# Patient Record
Sex: Male | Born: 1955 | Race: White | Hispanic: No | Marital: Married | Smoking: Never smoker
Health system: Southern US, Community
[De-identification: ages and names within clinical notes are randomized; demographics above are authoritative.]

## PROBLEM LIST (undated history)

## (undated) DIAGNOSIS — M1990 Unspecified osteoarthritis, unspecified site: Secondary | ICD-10-CM

## (undated) DIAGNOSIS — Z9689 Presence of other specified functional implants: Secondary | ICD-10-CM

## (undated) DIAGNOSIS — N200 Calculus of kidney: Secondary | ICD-10-CM

## (undated) DIAGNOSIS — G4730 Sleep apnea, unspecified: Secondary | ICD-10-CM

## (undated) DIAGNOSIS — G5620 Lesion of ulnar nerve, unspecified upper limb: Secondary | ICD-10-CM

## (undated) DIAGNOSIS — G20 Parkinson's disease: Secondary | ICD-10-CM

## (undated) DIAGNOSIS — G20A1 Parkinson's disease without dyskinesia, without mention of fluctuations: Secondary | ICD-10-CM

## (undated) DIAGNOSIS — G479 Sleep disorder, unspecified: Secondary | ICD-10-CM

## (undated) HISTORY — DX: Parkinson's disease: G20

## (undated) HISTORY — DX: Sleep disorder, unspecified: G47.9

## (undated) HISTORY — DX: Presence of other specified functional implants: Z96.89

## (undated) HISTORY — PX: OTHER SURGICAL HISTORY: SHX169

## (undated) HISTORY — DX: Calculus of kidney: N20.0

## (undated) HISTORY — DX: Lesion of ulnar nerve, unspecified upper limb: G56.20

---

## 1989-02-11 HISTORY — PX: OTHER SURGICAL HISTORY: SHX169

## 2002-06-28 ENCOUNTER — Encounter: Payer: Self-pay | Admitting: Emergency Medicine

## 2002-06-28 ENCOUNTER — Emergency Department (HOSPITAL_COMMUNITY): Admission: EM | Admit: 2002-06-28 | Discharge: 2002-06-28 | Payer: Self-pay

## 2011-10-18 ENCOUNTER — Other Ambulatory Visit: Payer: Self-pay | Admitting: Family Medicine

## 2011-10-18 DIAGNOSIS — R109 Unspecified abdominal pain: Secondary | ICD-10-CM

## 2011-10-22 ENCOUNTER — Other Ambulatory Visit: Payer: Self-pay

## 2011-11-19 ENCOUNTER — Other Ambulatory Visit: Payer: Self-pay | Admitting: Gastroenterology

## 2011-11-19 DIAGNOSIS — R109 Unspecified abdominal pain: Secondary | ICD-10-CM

## 2011-11-20 ENCOUNTER — Ambulatory Visit
Admission: RE | Admit: 2011-11-20 | Discharge: 2011-11-20 | Disposition: A | Discharge status: Home / Self Care | Payer: PRIVATE HEALTH INSURANCE | Source: Ambulatory Visit | Attending: Gastroenterology | Admitting: Gastroenterology

## 2011-11-20 DIAGNOSIS — R109 Unspecified abdominal pain: Secondary | ICD-10-CM

## 2011-11-20 MED ORDER — IOHEXOL 300 MG/ML  SOLN
125.0000 mL | Freq: Once | INTRAMUSCULAR | Status: AC | PRN
  Administered 2011-11-20: 125 mL via INTRAVENOUS

## 2012-07-15 ENCOUNTER — Ambulatory Visit (INDEPENDENT_AMBULATORY_CARE_PROVIDER_SITE_OTHER): Payer: PRIVATE HEALTH INSURANCE | Admitting: Neurology

## 2012-07-15 ENCOUNTER — Encounter: Payer: Self-pay | Admitting: Neurology

## 2012-07-15 VITALS — BP 131/71 | HR 71 | Temp 97.3°F | Ht 74.0 in | Wt 240.0 lb

## 2012-07-15 DIAGNOSIS — G20 Parkinson's disease: Secondary | ICD-10-CM

## 2012-07-15 NOTE — Progress Notes (Signed)
Subjective:    Patient ID: [name] is a 57 y.o. male.  HPI  Interim history:   [name] Is a 57 year old right-handed gentleman with a approximately 2 year history of right hand tremor who presents for followup consultation of his Parkinson's disease. He is accompanied by his wife today. This is his first visit with me and he previously followed with Dr. Fayrene Fearing love and was last seen by him on 03/17/2012 at which time Dr. Sandria Manly increase his Requip and continued the patient on the Azilect. The patient has an underlying medical history of hyperlipidemia otherwise. He is currently on Requip long-acting 4 mg daily and rasagiline 1 mg once daily.  I reviewed Dr. Imagene Gurney prior notes and the patient's records and below is a summary of that review:  57 year old right-handed gentleman with an approximately two-year history of right hand tremor with diagnosis of Parkinson's disease. MRI brain without contrast was done in the past. He has been on rasagiline. This improved his tremor. 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 There is no family history of tremor. There is no history of REM behavior disorder. He has had no falls, or hallucinations or involuntary movements otherwise. He exercises not very regularly.  He reports no new problems. His memory is stable. His mood is stable. He has not had any recent falls. His balance is fairly well preserved in his perception. His wife does not have any additional remarks or concerns. He has not been exercising regularly lately however. He has had no problems with lower extremities swelling or compulsive thoughts or ganglion or excessive eating. He tries to stay active and has 2 sons ages 33 as well as a sixth grader. He works full-time as a Scientist, research (physical sciences) and has noticed some daytime tiredness. He takes about his medications in the morning and was asked to try to take the Requip XL at 5 PM started forgetting the  dose because he typically is outside the house at that time so he switched back to morning.   His Past Medical History Is Significant For: Past Medical History  Diagnosis Date  . Calculus of kidney   . Sleep disturbance, unspecified   . Lesion of ulnar nerve   . Paralysis agitans     His Past Surgical History Is Significant For: Past Surgical History  Procedure Laterality Date  . Kidney stones  1991    His Family History Is Significant For: Family History  Problem Relation Age of Onset  . Heart failure Father   . Cancer Father   . Diabetes Father   . Sleep apnea Mother   . Sleep apnea Brother     His Social History Is Significant For: History   Social History  . Marital Status: Married    Spouse Name: N/A    Number of Children: N/A  . Years of Education: N/A   Social History Main Topics  . Smoking status: Never Smoker   . Smokeless tobacco: None  . Alcohol Use: 1.5 oz/week    3 drink(s) per week  . Drug Use: No  . Sexually Active: None   Other Topics Concern  . None   Social History Narrative  . None    His Allergies Are:  No Known Allergies:   His Current Medications Are:  Outpatient Encounter Prescriptions as of 07/15/2012  Medication Sig Dispense Refill  . AZILECT 1 MG TABS       . rOPINIRole (  REQUIP XL) 4 MG 24 hr tablet        No facility-administered encounter medications on file as of 07/15/2012.   Review of Systems  Constitutional: Positive for fatigue.  HENT: Positive for tinnitus.   Respiratory:       Snoring  Neurological: Positive for tremors.  Psychiatric/Behavioral: Positive for sleep disturbance.    Objective:  Neurologic Exam  Physical Exam Physical Examination:   Filed Vitals:   07/15/12 1131  BP: 131/71  Pulse: 71  Temp: 97.3 F (36.3 C)    General Examination: The patient is a very pleasant 57 y.o. male in no acute distress.  HEENT: Normocephalic, atraumatic, pupils are equal, round and reactive to light and  accommodation. Funduscopic exam is normal with sharp disc margins noted. Extraocular tracking shows mild saccadic breakdown without nystagmus noted. There is no limitation to his gaze. There is mild decrease in eye blink rate. Hearing is intact. Face is symmetric with mild facial masking and normal facial sensation. There is no lip, neck or jaw tremor. Neck is moderately rigid with intact passive ROM. There are no carotid bruits on auscultation. Oropharynx exam reveals mild mouth dryness. No significant airway crowding is noted. Mallampati is class II. Tongue protrudes centrally and palate elevates symmetrically.   There is no drooling.   Chest: is clear to auscultation without wheezing, rhonchi or crackles noted.  Heart: sounds are regular and normal without murmurs, rubs or gallops noted.   Abdomen: is soft, non-tender and non-distended with normal bowel sounds appreciated on auscultation.  Extremities: There is no pitting edema in the distal lower extremities bilaterally. There are no varicose veins.  Skin: is warm and dry with no trophic changes noted.  Musculoskeletal: exam reveals no obvious joint deformities, tenderness, joint swelling or erythema.  Neurologically:  Mental status: The patient is awake and alert, paying good  attention. He is able to completely provide the history. His wife provides details. He is oriented to: person, place, time/date, situation, day of week, month of year and year. His memory, attention, language and knowledge are intact. There is no aphasia, agnosia, apraxia or anomia. There is a no bradyphrenia. Speech is mildly hypophonic with no dysarthria noted. Mood is congruent and affect is normal.   Cranial nerves are as described above under HEENT exam. In addition, shoulder shrug is normal with equal shoulder height noted.  Motor exam: Normal bulk, and strength for age is noted. There are no dyskinesias noted.  Tone is mildly rigid with presence of cogwheeling  in the right upper extremity. There is overall mild bradykinesia. There is no drift or rebound.  There is a mild resting tremor in the right upper extremity. The tremor is intermittent.  Romberg is negative.  Reflexes are 1+ in the upper extremities and 1+ in the lower extremities.   Fine motor skills exam: Finger taps are moderately impaired on the right and mildly impaired on the left. Hand movements are mildly impaired on the right and Not impaired on the left. RAP (rapid alternating patting) is mildly impaired on the right and Not impaired on the left. Foot taps are moderately impaired on the right and mildly impaired on the left. Foot agility (in the form of heel stomping) is mildly impaired on the right and Not impaired on the left.    Cerebellar testing shows no dysmetria or intention tremor on finger to nose testing. Heel to shin is unremarkable bilaterally. There is no truncal or gait ataxia.   Sensory exam  is intact to light touch, pinprick, vibration, temperature sense and proprioception in the upper and lower extremities.   Gait, station and balance: He stands up from the seated position with no significant difficulty and does not need to push up with His hands. He needs no assistance. No veering to one side is noted. He is not noted to lean to the side. Posture is mildly stooped for age. Stance is narrow-based. He walks with good stride length and pace and decreased arm swing on the right. He turns in en bloc. Balance is Preserved.   Assessment and Plan:   Assessment and Plan:  In summary, [name] is a very pleasant 57 y.o.-year old male with a history of Parkinson's disease, right-sided predominant. His physical exam is stable and has not progressed in the last 4 months. He is doing fairly well at this time and I reassured the patient in that regard.  I had a long chat with the patient and his wife about His symptoms, my findings and the diagnosis of parkinsonism/Parksinson's  disease, its prognosis and treatment options. We talked about medical treatments and non-pharmacological approaches. We talked about maintaining a healthy lifestyle in general. I encouraged the patient to eat healthy, exercise daily and keep well hydrated, to keep a scheduled bedtime and wake time routine, to not skip any meals and eat healthy snacks in between meals and to have protein with every meal. In particular, I stressed the importance of regular exercise, within of course the patient's own mobility limitations. I asked him to consider taking coenzyme Q 10 at 400 mg 3 times a day as it has shown promising results with respect to perhaps delaying the progression of the disease. I also talked to him about surgical treatment options for Parkinson's disease in particular DBS. This would be something I would promote for him down the Road. As far as further diagnostic testing is concerned, I suggested: No new test today.  As far as medications are concerned, I recommended the following at this time: no change with the exception of trying coenzyme Q 10 and switching his Requip XL to nighttime because he has been noticing daytime somnolence. I answered all their questions today and the patient and his wife were in agreement with the above outlined plan. I would like to see the patient back in 6 months, sooner if the need arises and encouraged them to call with any interim questions, concerns, problems or updates and refill requests.

## 2012-07-15 NOTE — Patient Instructions (Addendum)
I think overall you are doing fairly well and are stable at this point.   I do have some generic suggestions for you today:   Please make sure that you drink plenty of fluids. I would like for you to exercise daily for example in the form of walking 20-30 minutes every day, if you can. Please keep a regular sleep-wake schedule, keep regular meal times, do not skip any meals, eat  healthy snacks in between meals, such as fruit or nuts. Try to eat protein with every meal.   As far as your medications are concerned, I would like to suggest: look into taking Co-enzyme Q 10, 400mg  three times a day. Try to switch the Requip to dinner time or bedtime.     As far as diagnostic testing, I recommend: no new tests.   I do not think we need to make any changes in your medications at this point. I think you're stable enough that I can see you back in 6 months, sooner if we need to. Please call us if you have any interim questions, concerns, or problems or updates to need to discuss.  Brett Canales is my clinical assistant and will answer any of your questions and relay your messages to me and will give you my messages.   Our phone number is [phone]. We also have an after hours call service for urgent matters and there is a physician on-call for urgent questions. For any emergencies you know to call 911 or go to the nearest emergency room.

## 2012-08-02 ENCOUNTER — Other Ambulatory Visit: Payer: Self-pay

## 2012-08-02 MED ORDER — ROPINIROLE HCL ER 4 MG PO TB24: 4.0000 mg | ORAL_TABLET | Freq: Every day | ORAL | Status: DC

## 2012-08-02 MED ORDER — RASAGILINE MESYLATE 1 MG PO TABS: 1.0000 mg | ORAL_TABLET | Freq: Every day | ORAL | Status: DC

## 2012-09-14 ENCOUNTER — Telehealth: Payer: Self-pay | Admitting: Neurology

## 2013-01-06 ENCOUNTER — Other Ambulatory Visit: Payer: Self-pay | Admitting: Neurology

## 2013-01-14 ENCOUNTER — Ambulatory Visit: Payer: PRIVATE HEALTH INSURANCE | Admitting: Neurology

## 2013-02-03 ENCOUNTER — Ambulatory Visit: Payer: PRIVATE HEALTH INSURANCE | Admitting: Neurology

## 2013-02-05 ENCOUNTER — Encounter: Payer: Self-pay | Admitting: Neurology

## 2013-02-05 ENCOUNTER — Ambulatory Visit (INDEPENDENT_AMBULATORY_CARE_PROVIDER_SITE_OTHER): Payer: PRIVATE HEALTH INSURANCE | Admitting: Neurology

## 2013-02-05 ENCOUNTER — Encounter (INDEPENDENT_AMBULATORY_CARE_PROVIDER_SITE_OTHER): Payer: Self-pay

## 2013-02-05 VITALS — BP 114/78 | HR 73 | Temp 97.8°F | Ht 74.0 in | Wt 243.0 lb

## 2013-02-05 DIAGNOSIS — G20 Parkinson's disease: Secondary | ICD-10-CM

## 2013-02-05 DIAGNOSIS — G20A1 Parkinson's disease without dyskinesia, without mention of fluctuations: Secondary | ICD-10-CM

## 2013-02-05 MED ORDER — RASAGILINE MESYLATE 1 MG PO TABS: 1.0000 mg | ORAL_TABLET | Freq: Every day | ORAL | Status: DC

## 2013-02-05 MED ORDER — ROTIGOTINE 4 MG/24HR TD PT24: 1.0000 | MEDICATED_PATCH | Freq: Every day | TRANSDERMAL | Status: DC

## 2013-02-05 NOTE — Patient Instructions (Signed)
I think your Parkinson's disease has become a little worse since you stopped the Requip. As you know, this disease does progress with time. It can affect your balance, your memory, your mood, your bowel and bladder function, your posture, balance and walking. Overall you are doing fairly well but I do want to suggest a few things today:  Remember to drink plenty of fluid, eat healthy meals and do not skip any meals. Try to eat protein with a every meal and eat a healthy snack such as fruit or nuts in between meals. Try to keep a regular sleep-wake schedule and try to exercise daily, particularly in the form of walking, 20-30 minutes a day, if you can.   Taking your medication on schedule is key.   Try to stay active physically and mentally. Engage in social activities in your community and with your family and try to keep up with current events by reading the newspaper or watching the news. Try to do word puzzles and you may like to do word puzzles and brain games on the computer such as on [email or web address]   As far as your medications are concerned, I would like to suggest that you take your current medication with the following additional changes: We will start you on Neupro: 2mg  once daily patch for 7 days, for which I have provided you with samples. Then 30 days of free prescription for 4 mg patch once daily. We can then continue with 4 mg/24h or go up to 6 mg or stay at 4 mg.      As far as diagnostic testing, I will order: no new test today.   I would like to see you back in 6 months, sooner if we need to. Please call us with any interim questions, concerns, problems, updates or refill requests.  Please also call us in about 3 weeks for an update and leave a message for me through one of our triage nurses. They will answer any of your questions and relay your messages to me and also relay most of my messages to you.  Our phone number is [phone]. We also have an after hours call service for  urgent matters and there is a physician on-call for urgent questions, that cannot wait till the next work day. For any emergencies you know to call 911 or go to the nearest emergency room.

## 2013-02-05 NOTE — Progress Notes (Signed)
Subjective:    Patient ID: [name] is a 57 y.o. male.  HPI    Interim history:   [name] is a 57 year old right-handed gentleman with an approximately 2 1/2 year history of right hand tremor, who presents for followup consultation of [name] Parkinson's disease. [name] is accompanied by [name] wife again today. I first met [name] on 07/15/2012, at which time I suggested [name] start taking coenzyme Q 10. I also suggested that [name] switch [name] Requip XL 4 mg to nighttime because of report of daytime somnolence. I did not increase make any other changes to [name] medications and felt that [name] was overall stable.  Today, [name] reports that [name] stopped taking the Requip about 8 weeks ago, stating that it was "ruining my life". [name] started having severe nausea and continued to have the sleepiness. [name] felt better initially after coming off of Requip and [name] wife noted, that [name] was always in a "daze", which has improved. [name] is noted to snore by [name] wife but she has not noticed any apneas while [name] is asleep. [name] denies choking sensations or gasping feeling while asleep. In the past few weeks [name] has noted a flareup in [name] tremors and [name] balance is a little worse. [name] feels worse in [name] motor symptoms and better with regards to the sleepiness. [name] mood is stable and [name] memory is good.   [name] previously followed with Dr. Fayrene Fearing love and was last seen by [name] on 03/17/2012 at which time Dr. Sandria Manly increase [name] Requip XL and continued [name] on Azilect.  [name] has an underlying medical history of hyperlipidemia. [name] is currently only on rasagiline 1 mg once daily.  MRI brain without contrast was done in the past. [name] has been on rasagiline, which  improved [name] tremor. 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 There is no family history of tremor. There is no history of REM behavior disorder. [name] has had no falls, or hallucinations or involuntary movements otherwise. [name] exercises not very regularly.  [name] memory and mood have been stable. [name] has no problems with lower extremities swelling or compulsive thoughts or gambling. 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   [name] Past Medical History Is Significant For: Past Medical History  Diagnosis Date  . Calculus of kidney   . Sleep disturbance, unspecified   . Lesion of ulnar nerve   . Paralysis agitans     [name] Past Surgical History Is Significant For: Past Surgical History  Procedure Laterality Date  . Kidney stones  1991    [name] Family History Is Significant For: Family History  Problem Relation Age of Onset  . Heart failure Father   . Cancer Father   . Diabetes Father   . Sleep apnea Mother   . Sleep apnea Brother     [name] Social History Is Significant For: History   Social History  . Marital Status: Married    Spouse Name: N/A    Number of Children: N/A  . Years of Education: N/A   Social History Main Topics  . Smoking status: Never Smoker   . Smokeless tobacco: None  . Alcohol Use: 1.5 oz/week    3 drink(s) per week  . Drug Use: No  . Sexual Activity: None   Other Topics Concern  . None   Social History Narrative  . None    [name] Allergies Are:  No Known Allergies:   [name] Current Medications  Are:  Outpatient Encounter Prescriptions as of 02/05/2013  Medication Sig  . AZILECT 1 MG TABS tablet TAKE 1 TABLET (1 MG TOTAL) BY MOUTH DAILY.  Marland Kitchen rOPINIRole (REQUIP XL) 4 MG 24 hr tablet Take 1 tablet (4 mg total) by mouth daily.  :  Review of Systems:  Out of a complete 14 point review of systems, all are reviewed and negative with the exception of these symptoms as listed below:   Review of Systems  Constitutional: Negative.   HENT: Positive for tinnitus.   Eyes: Negative.   Respiratory: Negative.   Cardiovascular: Negative.   Gastrointestinal: Negative.   Endocrine: Negative.   Genitourinary: Positive for frequency.  Musculoskeletal: Positive for neck stiffness.  Skin: Negative.    Allergic/Immunologic: Negative.   Neurological: Positive for tremors and headaches.  Hematological: Negative.   Psychiatric/Behavioral: Positive for sleep disturbance (daytime sleepiness).    Objective:  Neurologic Exam  Physical Exam  Assessment:   Physical Examination:   Filed Vitals:   02/05/13 1127  BP: 114/78  Pulse: 73  Temp: 97.8 F (36.6 C)    General Examination: The patient is a very pleasant 57 y.o. male in no acute distress.  HEENT: Normocephalic, atraumatic, pupils are equal, round and reactive to light and accommodation. Funduscopic exam is normal with sharp disc margins noted. Extraocular tracking shows mild saccadic breakdown without nystagmus noted. There is no limitation to [name] gaze. There is mild decrease in eye blink rate. Hearing is intact. Face is symmetric with mild facial masking and normal facial sensation. There is no lip, neck or jaw tremor. Neck is moderately rigid with intact passive ROM. There are no carotid bruits on auscultation. Oropharynx exam reveals mild mouth dryness. [name] has a mildly crowded airway in that [name] has a floppy appearing soft palate and elongated uvula. [name] is resistant to the tongue blade so it is hard for me to see [name] tonsils. Mallampati is class II. Tongue protrudes centrally and palate elevates symmetrically. There is no drooling.   Chest: is clear to auscultation without wheezing, rhonchi or crackles noted.  Heart: sounds are regular and normal without murmurs, rubs or gallops noted.   Abdomen: is soft, non-tender and non-distended with normal bowel sounds appreciated on auscultation.  Extremities: There is no pitting edema in the distal lower extremities bilaterally. There are no varicose veins.  Skin: is warm and dry with no trophic changes noted.  Musculoskeletal: exam reveals no obvious joint deformities, tenderness, joint swelling or erythema.  Neurologically:  Mental status: The patient is awake and alert, paying  good  attention. [name] is able to completely provide the history. [name] wife provides details. [name] is oriented to: person, place, time/date, situation, day of week, month of year and year. [name] memory, attention, language and knowledge are intact. There is no aphasia, agnosia, apraxia or anomia. There is a no bradyphrenia. Speech is mildly hypophonic with no dysarthria noted. Mood is congruent and affect is normal.   Cranial nerves are as described above under HEENT exam. In addition, shoulder shrug is normal with equal shoulder height noted.  Motor exam: Normal bulk, and strength for age is noted. There are no dyskinesias noted.  Tone is mildly rigid with presence of cogwheeling in the right upper extremity. There is overall mild bradykinesia. There is no drift or rebound.  There is a mild to moderate resting tremor in the right upper extremity. The tremor is fairly constant. This has worsened from last time. Romberg is negative.  Reflexes  are 1+ in the upper extremities and 1+ in the lower extremities.   Fine motor skills exam: Finger taps are moderately to severely impaired on the right and mildly impaired on the left. Hand movements are mildly impaired on the right and very mildly impaired on the left. RAP (rapid alternating patting) is mildly impaired on the right and minimally impaired on the left. Foot taps are moderately impaired on the right and mildly impaired on the left. Foot agility (in the form of heel stomping) is mildly impaired on the right and minimally impaired on the left.    Cerebellar testing shows no dysmetria or intention tremor on finger to nose testing. Heel to shin is unremarkable bilaterally. There is no truncal or gait ataxia.   Sensory exam is intact to light touch, pinprick, vibration, temperature sense in the upper and lower extremities.   Gait, station and balance: [name] stands up from the seated position with no significant difficulty and does not need to push up with [name]  hands. [name] needs no assistance. No veering to one side is noted. [name] is not noted to lean to the side. Posture is mildly stooped for age. Stance is narrow-based. [name] walks with slightly reduced stride length for it [name] height and mildly reduced pace and decreased arm swing on the right. [name] turns in en bloc. Balance is Preserved.   Assessment and Plan:  In summary, ANTAEUS KAREL is a very pleasant 57 year old male with a history of Parkinson's disease, right-sided predominant. [name] physical exam is a little worse today since [name] has stopped [name] Requip XL. [name] was not able to tolerate it. While [name] does not endorse any witnessed apneas, given [name] complaint of excessive daytime somnolence and [name] somewhat tighter looking airway as well as [name] history of snoring I would like to at some point do a sleep study. However, at this juncture, the sleepiness may very well be medication induced, as the sleepiness improved off of Requip. [name] tremor certainly flared up and [name] also noticed a subjective worsening in [name] balance. [name] would like to hold off on a sleep study at this juncture but would be willing to consider it in the future if the need arises. I've encouraged [name] wife to watch for apneas. She currently has not noticed any witnessed apneas and [name] denies any choking sensations or gasping while asleep. [name] has always had some trouble maintaining sleep. I had a long chat again with the patient and [name] wife today. I would like for [name] to continue the Azilect 1 mg at the current dose. I do think that we are to initiate an additional medication at this time. I would like to be able to spare levodopa as long as we can and to that end I would like for [name] to try rotigotine patch. I provided [name] with a one-week sample of the 2 mg strength. I also provided [name] with a 30 day free trial prescription for the 4 mg patch. After about 3 weeks [name] is advised to call for an update. At that time we can continue with 4 mg, increased to 6 mg  patches or reduce to 2 mg if the need arises. I talked to them about potential side effects including headaches, nausea, sleepiness. I encouraged the patient to eat healthy, exercise daily and keep well hydrated, to keep a scheduled bedtime and wake time routine, to not skip any meals and eat healthy snacks in between meals and to have protein with  every meal. In particular, I stressed the importance of regular exercise, within of course the patient's own mobility limitations. I answered all their questions today and the patient and [name] wife were in agreement with the above outlined plan. I would like to see the patient back in 6 months, sooner if the need arises and encouraged them to call with any interim questions, concerns, problems or updates and refill requests.

## 2013-03-11 ENCOUNTER — Other Ambulatory Visit: Payer: Self-pay | Admitting: Neurology

## 2013-03-11 NOTE — Telephone Encounter (Signed)
I tried to call the patient to see how he was doing on this dose, got no answer.  No VM.

## 2013-04-14 ENCOUNTER — Other Ambulatory Visit: Payer: Self-pay | Admitting: Neurology

## 2013-04-15 ENCOUNTER — Telehealth: Payer: Self-pay | Admitting: Neurology

## 2013-04-15 NOTE — Telephone Encounter (Signed)
Pt called regarding medication patch that Dr. Frances FurbishAthar prescribed pt. Pt states the first  6 weeks was fine  Now irritation and leaving marks on skin for [redacted] weeks along with itching. He thinks patch is working but is getting concerned about the irritation of the skin. Please call pt concerning this matter

## 2013-04-15 NOTE — Telephone Encounter (Signed)
Patient is calling concerned about the skin irration that he is having from the patch that he is using. Patient states that the patch seems to be working, but has some concerns about the skin irration. Please advise.

## 2013-04-15 NOTE — Telephone Encounter (Signed)
Unfortunately, this seems to be a more lasting skin reaction than just a transient irritation. Please have patient stop neupro patch.

## 2013-04-16 NOTE — Telephone Encounter (Signed)
Called patient to inform him per Dr. Frances FurbishAthar to stop using the neupro patches. Patient stated that he needed something else to used. Please advise

## 2013-04-16 NOTE — Telephone Encounter (Signed)
[facility] Please return call to patient.

## 2013-04-16 NOTE — Telephone Encounter (Signed)
I would recommend a new medication right away to make sure his reaction to the patch is gone. Perhaps we can wait until next week and see if his rash and pruritus are gone. Please ask him to call us for an update mid or end of next week and we will consider another medication at that time.

## 2013-04-16 NOTE — Telephone Encounter (Signed)
Correction: I would NOT recommend another med right away after stopping Neupro. See below for rest of my message.

## 2013-04-16 NOTE — Telephone Encounter (Signed)
Pt called back.  He asked what medication should he been on since Dr. Frances FurbishAthar advised him to stop using them.  Please call to advise.  Thank you

## 2013-04-19 NOTE — Telephone Encounter (Signed)
Called patient and left message informing him that Dr. Frances FurbishAthar would like for him to wait until the skin irration was completely cleared up before trying a new medication. I advised the patient that if he has any other problems, questions or concern to call back.

## 2013-04-22 MED ORDER — ROTIGOTINE 4 MG/24HR TD PT24: 1.0000 | MEDICATED_PATCH | Freq: Every day | TRANSDERMAL | Status: DC

## 2013-04-22 NOTE — Telephone Encounter (Signed)
We can renew prescription for neupro 4 milligrams strength, but please advise patient that the only reason we discontinued it was the rash. There is a good chance he may have another issue with skin rash with restarting it. As long as he wants to try it again understanding this, I am fine with it. It did not sound like the rash was severe. A local skin reaction is always possible and if tolerable does not have to result in discontinuation of medication as long as it does not cause blisters or itching or hives.

## 2013-04-22 NOTE — Telephone Encounter (Signed)
Pt called back states the rash is gone and he needs to get a prescription for NEUPRO 4 MG/24HR called into his pharmacy. Please call pt back once this has been called in.

## 2013-04-22 NOTE — Telephone Encounter (Signed)
Patient called operator stating rash has cleared and he would like to get a refill on Neupro.  By viewing previous messages, it seems as though Neupro was d/c due to skin irritation, and we may consider changing to a new drug once rash cleared.  I tried to call the patient back, got no answer.  Is there a new medication you would like to prescribe?  Please advise.  Thank you.

## 2013-04-22 NOTE — Telephone Encounter (Signed)
Rx has been sent.  I called the patient to advise.  Got no answer.  Left message relaying info noted by Dr Frances FurbishAthar.  Asked him to call us back if there were any questions or adverse side effects.

## 2013-05-13 NOTE — Telephone Encounter (Signed)
Closing encounter

## 2013-06-15 ENCOUNTER — Other Ambulatory Visit: Payer: Self-pay | Admitting: Neurology

## 2013-07-28 ENCOUNTER — Ambulatory Visit (INDEPENDENT_AMBULATORY_CARE_PROVIDER_SITE_OTHER): Payer: PRIVATE HEALTH INSURANCE | Admitting: Neurology

## 2013-07-28 ENCOUNTER — Encounter: Payer: Self-pay | Admitting: Neurology

## 2013-07-28 VITALS — BP 122/86 | HR 60 | Temp 97.1°F | Ht 74.0 in | Wt 250.0 lb

## 2013-07-28 DIAGNOSIS — R0609 Other forms of dyspnea: Secondary | ICD-10-CM

## 2013-07-28 DIAGNOSIS — R0683 Snoring: Secondary | ICD-10-CM

## 2013-07-28 DIAGNOSIS — R0989 Other specified symptoms and signs involving the circulatory and respiratory systems: Secondary | ICD-10-CM

## 2013-07-28 DIAGNOSIS — E663 Overweight: Secondary | ICD-10-CM

## 2013-07-28 DIAGNOSIS — R400 Somnolence: Secondary | ICD-10-CM

## 2013-07-28 DIAGNOSIS — G20 Parkinson's disease: Secondary | ICD-10-CM

## 2013-07-28 DIAGNOSIS — G4710 Hypersomnia, unspecified: Secondary | ICD-10-CM

## 2013-07-28 MED ORDER — ROTIGOTINE 6 MG/24HR TD PT24: 1.0000 | MEDICATED_PATCH | Freq: Every day | TRANSDERMAL | Status: DC

## 2013-07-28 MED ORDER — RASAGILINE MESYLATE 1 MG PO TABS: 1.0000 mg | ORAL_TABLET | Freq: Every day | ORAL | Status: DC

## 2013-07-28 NOTE — Patient Instructions (Addendum)
I think your Parkinson's disease has remained fairly stable, which is reassuring. Nevertheless, as you know, this disease does progress with time. It can affect your balance, your memory, your mood, your bowel and bladder function, your posture, balance and walking. Overall you are doing fairly well but I do want to suggest a few things today:  Remember to drink plenty of fluid, eat healthy meals and do not skip any meals. Try to eat protein with a every meal and eat a healthy snack such as fruit or nuts in between meals. Try to keep a regular sleep-wake schedule and try to exercise daily, particularly in the form of walking, 20-30 minutes a day, if you can.   Taking your medication on schedule is key.   Try to stay active physically and mentally. Engage in social activities in your community and with your family and try to keep up with current events by reading the newspaper or watching the news. Try to do word puzzles and you may like to do word puzzles and brain games on the computer such as on [email or web address].   As far as your medications are concerned, I would like to suggest that you take your current medication with the following additional changes: increase the Neupro to 6 mg daily. Use the co-pay card.     As far as diagnostic testing, I will order: no new test.   I would like to see you back in 4 months, sooner if we need to. Please call us with any interim questions, concerns, problems, updates or refill requests.

## 2013-07-28 NOTE — Progress Notes (Signed)
Subjective:    Patient ID: [name] is a 58 y.o. male.  HPI   Interim history:   Mr. [name] is a 58 year old right-handed gentleman with an underlying history of hyperlipidemia, who presents for followup consultation of his right-sided predominant Parkinson's disease. He is accompanied by his wife again today. I last saw him on 02/05/2013, at which time I felt his physical exam was a little worse since stopping Requip XL. He had stopped this because of daytime somnolence and severe nausea. I suggested we start him on Neupro patch. I provided him with samples. We talked about doing a sleep study down the [place].   Today, he reports having noted some worsening of his tremor and his gait is slower. He has been able to tolerate neupro patch 4 mg strength. He has had some skin irritation, most likely from the adhesive. He does not exercise regularly but is quite active and he continues to work full time. His memory is stable. His mood is stable. He denies any impulse control disorder. He does have mild daytime sleepiness but not nearly as severe as when he was on Requip XL.   I first met him on 07/15/2012, at which time I suggested he start taking coenzyme Q 10. I also suggested that he switch his Requip XL 4 mg to nighttime because of report of daytime somnolence. I did not increase make any other changes to his medications and felt that he was overall stable.  He previously followed with Dr. Jeneen Rinks love and was last seen by him on 03/17/2012 at which time Dr. Erling Cruz increase his Requip XL and continued him on Azilect.  MRI brain without contrast was done in the past. He has been on rasagiline, which improved his tremor. 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 There is no family history of tremor. There is no history of REM behavior disorder. He has had no falls, or hallucinations or involuntary movements otherwise. He exercises not very regularly. His  memory and mood have been stable. He has no problems with lower extremities swelling or compulsive thoughts or gambling. He works full-time as a Engineer, site.   His Past Medical History Is Significant For: Past Medical History  Diagnosis Date  . Calculus of kidney   . Sleep disturbance, unspecified   . Lesion of ulnar nerve   . Paralysis agitans     His Past Surgical History Is Significant For: Past Surgical History  Procedure Laterality Date  . Kidney stones  1991    His Family History Is Significant For: Family History  Problem Relation Age of Onset  . Heart failure Father   . Cancer Father   . Diabetes Father   . Sleep apnea Mother   . Sleep apnea Brother     His Social History Is Significant For: History   Social History  . Marital Status: Married    Spouse Name: N/A    Number of Children: N/A  . Years of Education: N/A   Social History Main Topics  . Smoking status: Never Smoker   . Smokeless tobacco: None  . Alcohol Use: 1.5 oz/week    3 drink(s) per week  . Drug Use: No  . Sexual Activity: None   Other Topics Concern  . None   Social History Narrative  . None    His Allergies Are:  No Known Allergies:   His Current Medications Are:  Outpatient Encounter Prescriptions as  of 07/28/2013  Medication Sig  . rasagiline (AZILECT) 1 MG TABS tablet Take 1 tablet (1 mg total) by mouth daily.  . rotigotine (NEUPRO) 4 MG/24HR Place 1 patch onto the skin daily.  . [DISCONTINUED] AZILECT 1 MG TABS tablet TAKE 1 TABLET (1 MG TOTAL) BY MOUTH DAILY.  :  Review of Systems:  Out of a complete 14 point review of systems, all are reviewed and negative with the exception of these symptoms as listed below: Review of Systems  Constitutional: Negative.   HENT: Negative.   Eyes: Negative.   Respiratory: Negative.   Cardiovascular: Negative.   Gastrointestinal: Negative.   Endocrine: Negative.   Genitourinary: Positive for frequency.  Musculoskeletal:  Negative.   Skin: Negative.   Allergic/Immunologic: Negative.   Neurological: Negative.   Hematological: Negative.   Psychiatric/Behavioral: Negative.     Objective:  Neurologic Exam  Physical Exam Physical Examination:   Filed Vitals:   07/28/13 1218  BP: 122/86  Pulse: 60  Temp: 97.1 F (36.2 C)    General Examination: The patient is a very pleasant 58 y.o. male in no acute distress. He is in good spirits today.   HEENT: Normocephalic, atraumatic, pupils are equal, round and reactive to light and accommodation. Funduscopic exam is normal with sharp disc margins noted. Extraocular tracking shows mild saccadic breakdown without nystagmus noted. There is no limitation to his gaze. There is mild decrease in eye blink rate. Hearing is intact. Face is symmetric with mild facial masking and normal facial sensation. There is no lip, neck or jaw tremor. Neck is moderately rigid with intact passive ROM. There are no carotid bruits on auscultation. Oropharynx exam reveals mild mouth dryness. He has a mild to moderately crowded airway in that he has a floppy appearing soft palate and elongated uvula. Mallampati is class II. Tongue protrudes centrally and palate elevates symmetrically. There is no drooling.   Chest: is clear to auscultation without wheezing, rhonchi or crackles noted.  Heart: sounds are regular and normal without murmurs, rubs or gallops noted.   Abdomen: is soft, non-tender and non-distended with normal bowel sounds appreciated on auscultation.  Extremities: There is no pitting edema in the distal lower extremities bilaterally. There are no varicose veins.  Skin: is warm and dry with no trophic changes noted.  Musculoskeletal: exam reveals no obvious joint deformities, tenderness, joint swelling or erythema.  Neurologically:  Mental status: The patient is awake and alert, paying good  attention. He is able to completely provide the history. His wife provides details. He  is oriented to: person, place, time/date, situation, day of week, month of year and year. His memory, attention, language and knowledge are intact. There is no aphasia, agnosia, apraxia or anomia. There is a no bradyphrenia. Speech is mildly hypophonic with no dysarthria noted. Mood is congruent and affect is normal.   Cranial nerves are as described above under HEENT exam. In addition, shoulder shrug is normal with equal shoulder height noted.  Motor exam: Normal bulk, and strength for age is noted. There are no dyskinesias noted.  Tone is mildly rigid with presence of cogwheeling in the right upper extremity. There is overall mild bradykinesia. There is no drift or rebound.  There is a mild to moderate resting tremor in the right upper extremity. The tremor is fairly constant. There is a slight intermittent LUE and LLE resting tremor.   Romberg is negative.   Reflexes are 1+ in the upper extremities and 1+ in the lower extremities.  Fine motor skills exam: Finger taps are moderately to severely impaired on the right and mildly impaired on the left. Hand movements are mildly impaired on the right and very mildly impaired on the left. RAP (rapid alternating patting) is mildly impaired on the right and minimally impaired on the left. Foot taps are moderately impaired on the right and mildly impaired on the left. Foot agility (in the form of heel stomping) is mildly impaired on the right and minimally impaired on the left.    Cerebellar testing shows no dysmetria or intention tremor on finger to nose testing. Heel to shin is unremarkable bilaterally. There is no truncal or gait ataxia.   Sensory exam is intact to light touch.   Gait, station and balance: He stands up from the seated position with no significant difficulty and does not need to push up with His hands. He needs no assistance. No veering to one side is noted. He is noted to lean slightly to the R side. Posture is mildly stooped for age.  Stance is narrow-based. He walks with slightly reduced stride length for it his height and mildly reduced pace and decreased arm swing on the right. He turns in en bloc. Balance is Preserved.   Assessment and Plan:   In summary, TREYSHAUN KEATTS is a very pleasant 58 year old male with a history of Parkinson's disease, right-sided predominant. His physical exam is slightly worse today, but his nonmotor symptoms are improved or stable. He has been able to tolerate rotigotine patch 4 mg strength and continues to be on Azilect 1 mg once daily. For his daytime sleepiness I have again encouraged him to consider a sleep study. He again would like to hold off on this. While he does not have a telltale history of obstructive sleep apnea his complaint of excessive daytime somnolence and his somewhat tighter looking airway as well as his history of snoring  and his overweight state to put him in a higher risk category. I again had a long chat with the patient and his wife today. I would like for him to continue the Azilect 1 mg at the current dose. I do think that we should increase the rotigotine patch to 6 mg daily. I talked to them about potential side effects including headaches, nausea, sleepiness. I encouraged the patient to eat healthy, exercise daily and keep well hydrated, to keep a scheduled bedtime and wake time routine, to not skip any meals and eat healthy snacks in between meals and to have protein with every meal. In particular, I stressed the importance of regular exercise, within of course the patient's own mobility limitations. I answered all their questions today and the patient and his wife were in agreement with the above outlined plan. I would like to see the patient back in 4 months, sooner if the need arises and encouraged them to call with any interim questions, concerns, problems or updates and refill requests.

## 2013-08-09 ENCOUNTER — Ambulatory Visit: Payer: PRIVATE HEALTH INSURANCE | Admitting: Neurology

## 2013-09-30 ENCOUNTER — Other Ambulatory Visit: Payer: Self-pay | Admitting: Neurology

## 2013-12-03 ENCOUNTER — Encounter: Payer: Self-pay | Admitting: Neurology

## 2013-12-03 ENCOUNTER — Ambulatory Visit (INDEPENDENT_AMBULATORY_CARE_PROVIDER_SITE_OTHER): Payer: PRIVATE HEALTH INSURANCE | Admitting: Neurology

## 2013-12-03 VITALS — BP 107/76 | HR 73 | Temp 98.0°F | Ht 74.0 in | Wt 244.0 lb

## 2013-12-03 DIAGNOSIS — G20 Parkinson's disease: Secondary | ICD-10-CM

## 2013-12-03 MED ORDER — RASAGILINE MESYLATE 1 MG PO TABS: ORAL_TABLET | ORAL | Status: DC

## 2013-12-03 MED ORDER — ROTIGOTINE 6 MG/24HR TD PT24: 1.0000 | MEDICATED_PATCH | Freq: Every day | TRANSDERMAL | Status: DC

## 2013-12-03 NOTE — Progress Notes (Signed)
Subjective:    Patient ID: [name] is a 58 y.o. male.  HPI    Interim history:   [name] is a 58 year old right-handed gentleman with an underlying history of hyperlipidemia and kidney stones, who presents for followup consultation of his right-sided predominant Parkinson's disease. He is unaccompanied today. I last saw him on 07/28/2013, at which time he reported some worsening of his tremor and his gait. He was able to tolerate neupro patch 4 mg strength. He has had some skin irritation, most likely from the adhesive. He was not exercising regularly but endorsed being active and working full-time. His memory was stable. He denies any impulse control disorder but had mild daytime somnolence which was not as severe as when he was on Requip long-acting.  Today, he reports doing well overall. He is better with his exercise. He is trying to 8 injuring the right. He was playing baseball with his 18 year old son and got hit by the ball in his right shin area. He has had a swelling there but no tenderness and no actual bruising. He has his yearly checkup with his primary care physician in February. He's feeling well. He thinks the Neupro at 6 mg has helped. In fact he was away for a long weekend and forgot to take his patches with him noticed a real difference. However when he restarted the patch he noticed significant nausea in the first few days. I saw him on 02/05/2013, at which time I felt his physical exam was a little worse since stopping Requip XL. He had stopped this because of daytime somnolence and severe nausea. I suggested we start him on Neupro patch. I provided him with samples. We talked about doing a sleep study down the [place].  I first met him on 07/15/2012, at which time I suggested he start taking coenzyme Q 10. I also suggested that he switch his Requip XL 4 mg to nighttime because of report of daytime somnolence. I did not increase make any other changes to his medications and  felt that he was overall stable.  He previously followed with Dr. Jeneen Rinks love and was last seen by him on 03/17/2012 at which time Dr. Erling Cruz increase his Requip XL and continued him on Azilect. He was diagnosed in 12/2008, and Sx go back to a year prior to that.  MRI brain without contrast was done in the past. He has been on rasagiline, which improved his tremor. 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 There is no family history of tremor. There is no history of REM behavior disorder. He has had no falls, or hallucinations or involuntary movements otherwise. He exercises not very regularly. His memory and mood have been stable. He has no problems with lower extremities swelling or compulsive thoughts or gambling. He works full-time as a Engineer, site.   His Past Medical History Is Significant For: Past Medical History  Diagnosis Date  . Calculus of kidney   . Sleep disturbance, unspecified   . Lesion of ulnar nerve   . Paralysis agitans     His Past Surgical History Is Significant For: Past Surgical History  Procedure Laterality Date  . Kidney stones  1991    His Family History Is Significant For: Family History  Problem Relation Age of Onset  . Heart failure Father   . Cancer Father   . Diabetes Father   . Sleep apnea Mother   . Sleep apnea  Brother     His Social History Is Significant For: History   Social History  . Marital Status: Married    Spouse Name: N/A    Number of Children: N/A  . Years of Education: N/A   Social History Main Topics  . Smoking status: Never Smoker   . Smokeless tobacco: None  . Alcohol Use: 1.5 oz/week    3 drink(s) per week  . Drug Use: No  . Sexual Activity: None   Other Topics Concern  . None   Social History Narrative  . None    His Allergies Are:  No Known Allergies:   His Current Medications Are:  Outpatient Encounter Prescriptions as of 12/03/2013  Medication Sig  .  rasagiline (AZILECT) 1 MG TABS tablet TAKE 1 TABLET (1 MG TOTAL) BY MOUTH DAILY.  . rotigotine (NEUPRO) 6 MG/24HR Place 1 patch onto the skin daily.  . [DISCONTINUED] AZILECT 1 MG TABS tablet TAKE 1 TABLET (1 MG TOTAL) BY MOUTH DAILY.  . [DISCONTINUED] rasagiline (AZILECT) 1 MG TABS tablet Take 1 tablet (1 mg total) by mouth daily.  . [DISCONTINUED] rotigotine (NEUPRO) 4 MG/24HR Place 1 patch onto the skin daily.  . [DISCONTINUED] rotigotine (NEUPRO) 6 MG/24HR Place 1 patch onto the skin daily.  :  Review of Systems:  Out of a complete 14 point review of systems, all are reviewed and negative with the exception of these symptoms as listed below:  Review of Systems Neg.  Objective:  Neurologic Exam  Physical Exam Physical Examination:   Filed Vitals:   12/03/13 1136  BP: 107/76  Pulse: 73  Temp: 98 F (36.7 C)   General Examination: The patient is a very pleasant 58 y.o. male in no acute distress. He is in good spirits today.   HEENT: Normocephalic, atraumatic, pupils are equal, round and reactive to light and accommodation. Funduscopic exam is normal with sharp disc margins noted. Extraocular tracking shows mild saccadic breakdown without nystagmus noted. There is no limitation to his gaze. There is mild decrease in eye blink rate. Hearing is intact. Face is symmetric with mild facial masking and normal facial sensation. There is no lip, neck or jaw tremor. Neck is moderately rigid with intact passive ROM. There are no carotid bruits on auscultation. Oropharynx exam reveals mild mouth dryness. He has a mild to moderately crowded airway in that he has a floppy appearing soft palate and elongated uvula. Mallampati is class II. Tongue protrudes centrally and palate elevates symmetrically. There is no drooling.   Chest: is clear to auscultation without wheezing, rhonchi or crackles noted.  Heart: sounds are regular and normal without murmurs, rubs or gallops noted.   Abdomen: is soft,  non-tender and non-distended with normal bowel sounds appreciated on auscultation.  Extremities: There is no pitting edema in the distal lower extremities bilaterally. There are no varicose veins. He has a mild relatively firm swelling in the medial aspect of his right shin. He has slight discoloration but no overt bruits. He has no palpable cord and is not tender in his calf and has no tenderness with passive or active movement of his ankle or knee. He has no edema.  Skin: is warm and dry with no trophic changes noted.  Musculoskeletal: exam reveals no obvious joint deformities, tenderness, joint swelling or erythema.  Neurologically:  Mental status: The patient is awake and alert, paying good  attention. He is able to completely provide the history. He is oriented to: person, place, time/date, situation, day  of week, month of year and year. His memory, attention, language and knowledge are intact. There is no aphasia, agnosia, apraxia or anomia. There is a no bradyphrenia. Speech is mildly hypophonic with no dysarthria noted. Mood is congruent and affect is normal.   Cranial nerves are as described above under HEENT exam. In addition, shoulder shrug is normal with equal shoulder height noted.  Motor exam: Normal bulk, and strength for age is noted. There are no dyskinesias noted.  Tone is mildly rigid with presence of cogwheeling in the right upper extremity. There is overall mild bradykinesia. There is no drift or rebound.  There is a mild to moderate resting tremor in the right upper extremity. The tremor is fairly constant. There is no other tremor today.   Romberg is negative.   Reflexes are 1+ in the upper extremities and 1+ in the lower extremities.   Fine motor skills exam: Finger taps are moderately to severely impaired on the right and mildly impaired on the left. Hand movements are mildly impaired on the right and very mildly impaired on the left. RAP (rapid alternating patting) is  mildly impaired on the right and minimally impaired on the left. Foot taps are moderately impaired on the right and mildly impaired on the left. Foot agility (in the form of heel stomping) is mildly impaired on the right and minimally impaired on the left.    Cerebellar testing shows no dysmetria or intention tremor on finger to nose testing. Heel to shin is unremarkable bilaterally. There is no truncal or gait ataxia.   Sensory exam is intact to light touch.   Gait, station and balance: He stands up from the seated position with no significant difficulty and does not need to push up with His hands. He needs no assistance. No veering to one side is noted. He is noted to lean slightly to the R side. Posture is mildly stooped for age. Stance is narrow-based. He walks with slightly reduced stride length for it his height and mildly reduced pace and decreased arm swing on the right. He turns in en bloc. Balance is Preserved.   Assessment and Plan:   In summary, TERRON MERFELD is a very pleasant 58 year old male with a history of Parkinson's disease, right-sided predominant. His physical exam is stable and he has been able to tolerate Azilect 1 mg once daily and Neupro patch 6 mg once daily. He's doing well at this time. he feels well. I asked him to watch the swelling of his right leg but it does not look like eczema and he may have had a bruise inside. I do not see any signs or symptoms of a blood clot. Nevertheless he is asked to monitor it. We will continue with the current medications. We talked about next steps down the [place]. We may increase his Neupro to 8 mg down the [place] we also talked about perhaps adding something like Rytary in the near future. I renewed his 2 prescriptions today. I encouraged the patient to eat healthy, exercise daily and keep well hydrated, to keep a scheduled bedtime and wake time routine, to not skip any meals and eat healthy snacks in between meals and to have protein with  every meal. In particular, I stressed the importance of regular exercise, within of course the patient's own mobility limitations. I answered all their questions today and the patient was in agreement with the above outlined plan. I would like to see the patient back in 4  to 5 months, sooner if the need arises and encouraged them to call with any interim questions, concerns, problems or updates and refill requests.  he's also encouraged to e-mail me through My Chart in Epic.

## 2013-12-03 NOTE — Patient Instructions (Signed)
I think your Parkinson's disease has remained fairly stable, which is reassuring. Nevertheless, as you know, this disease does progress with time. It can affect your balance, your memory, your mood, your bowel and bladder function, your posture, balance and walking. Overall you are doing fairly well but I do want to suggest a few things today:  Remember to drink plenty of fluid, eat healthy meals and do not skip any meals. Try to eat protein with a every meal and eat a healthy snack such as fruit or nuts in between meals. Try to keep a regular sleep-wake schedule and try to exercise daily, particularly in the form of walking, 20-30 minutes a day, if you can.   Taking your medication on schedule is key.   Try to stay active physically and mentally. Engage in social activities in your community and with your family and try to keep up with current events by reading the newspaper or watching the news. Try to do word puzzles and you may like to do word puzzles and brain games on the computer such as on [email or web address].   As far as your medications are concerned, I would like to suggest that you take your current medication with the following additional changes: no changes.     As far as diagnostic testing, I will order: no new test.   I would like to see you back in 4-5 months, sooner if we need to. Please call us with any interim questions, concerns, problems, updates or refill requests.  Please also call us for any test results so we can go over those with you on the phone. Our nursing staff will answer any of your questions and relay your messages to me and also relay most of my messages to you.  Our phone number is [phone]. We also have an after hours call service for urgent matters and there is a physician on-call for urgent questions, that cannot wait till the next work day. For any emergencies you know to call 911 or go to the nearest emergency room.

## 2014-04-05 ENCOUNTER — Ambulatory Visit (INDEPENDENT_AMBULATORY_CARE_PROVIDER_SITE_OTHER): Payer: PRIVATE HEALTH INSURANCE | Admitting: Neurology

## 2014-04-05 ENCOUNTER — Telehealth: Payer: Self-pay | Admitting: Neurology

## 2014-04-05 ENCOUNTER — Encounter: Payer: Self-pay | Admitting: Neurology

## 2014-04-05 VITALS — BP 116/78 | HR 78 | Temp 97.9°F | Ht 74.0 in | Wt 253.0 lb

## 2014-04-05 DIAGNOSIS — G20 Parkinson's disease: Secondary | ICD-10-CM

## 2014-04-05 DIAGNOSIS — R600 Localized edema: Secondary | ICD-10-CM

## 2014-04-05 MED ORDER — ROTIGOTINE 6 MG/24HR TD PT24: 1.0000 | MEDICATED_PATCH | Freq: Every day | TRANSDERMAL | Status: DC

## 2014-04-05 MED ORDER — RASAGILINE MESYLATE 1 MG PO TABS: ORAL_TABLET | ORAL | Status: DC

## 2014-04-05 NOTE — Progress Notes (Signed)
Subjective:    Patient ID: [name] is a 59 y.o. male.  HPI     Interim history:   [name] is a 59 year old right-handed gentleman with an underlying history of hyperlipidemia and kidney stones, who presents for followup consultation of his right-sided predominant Parkinson's disease. He is unaccompanied today. I last saw him on 12/03/2013, at which time he reported doing well and had noted a benefit from Neupro patch 6 mg. I continued him on this dose as well as Azilect 1 mg once daily. His exam was stable.  Today, he reports feeling stable, but his wife feels that his balance is not as good. He has started to workout with a trainer and has always been active. He plays golf, he plays baseball with his kids, he plays some basketball. He has picked up boxing. He still works full time and is at his desk a lot. He may not be drinking enough water. He likes to drink tea and has one diet Coke a day. He has cut back on his sodas. He has a Paramedic in Primary school teacher. He had his yearly physical with Dr. Rex Kras a couple weeks ago and had blood work all of which were fine. He has had ongoing lower extremity swelling. While it does not bother him it is still there.   I saw him on 07/28/2013, at which time he reported some worsening of his tremor and his gait. He was able to tolerate neupro patch 4 mg strength. He has had some skin irritation, most likely from the adhesive. He was not exercising regularly but endorsed being active and working full-time. His memory was stable. He denies any impulse control disorder but had mild daytime somnolence which was not as severe as when he was on Requip long-acting.    I saw him on 02/05/2013, at which time I felt his physical exam was a little worse since stopping Requip XL. He had stopped this because of daytime somnolence and severe nausea. I suggested we start him on Neupro patch. I provided him with samples. We talked about doing a sleep study  down the [place].    I first met him on 07/15/2012, at which time I suggested he start taking coenzyme Q 10. I also suggested that he switch his Requip XL 4 mg to nighttime because of report of daytime somnolence. I did not increase make any other changes to his medications and felt that he was overall stable.    He previously followed with Dr. Jeneen Rinks love and was last seen by him on 03/17/2012 at which time Dr. Erling Cruz increase his Requip XL and continued him on Azilect. He was diagnosed in 12/2008, and Sx go back to a year prior to that.   MRI brain without contrast was done in the past. He has been on rasagiline, which improved his tremor. 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 There is no family history of tremor. There is no history of REM behavior disorder. He has had no falls, or hallucinations or involuntary movements otherwise. He exercises not very regularly. His memory and mood have been stable. He has no problems with lower extremities swelling or compulsive thoughts or gambling. He works full-time as a Engineer, site.   His Past Medical History Is Significant For: Past Medical History  Diagnosis Date  . Calculus of kidney   . Sleep disturbance, unspecified   . Lesion of ulnar nerve   .  Paralysis agitans     His Past Surgical History Is Significant For: Past Surgical History  Procedure Laterality Date  . Kidney stones  1991    His Family History Is Significant For: Family History  Problem Relation Age of Onset  . Heart failure Father   . Cancer Father   . Diabetes Father   . Sleep apnea Mother   . Sleep apnea Brother     His Social History Is Significant For: History   Social History  . Marital Status: Married    Spouse Name: N/A  . Number of Children: N/A  . Years of Education: N/A   Social History Main Topics  . Smoking status: Never Smoker   . Smokeless tobacco: Not on file  . Alcohol Use: 1.8 oz/week    3  Standard drinks or equivalent per week  . Drug Use: No  . Sexual Activity: Not on file   Other Topics Concern  . None   Social History Narrative    His Allergies Are:  No Known Allergies:   His Current Medications Are:  Outpatient Encounter Prescriptions as of 04/05/2014  Medication Sig  . rasagiline (AZILECT) 1 MG TABS tablet TAKE 1 TABLET (1 MG TOTAL) BY MOUTH DAILY.  . rotigotine (NEUPRO) 6 MG/24HR Place 1 patch onto the skin daily.  :  Review of Systems:  Out of a complete 14 point review of systems, all are reviewed and negative with the exception of these symptoms as listed below:   Review of Systems  All other systems reviewed and are negative.   Objective:  Neurologic Exam  Physical Exam Physical Examination:   Filed Vitals:   04/05/14 1116  BP: 116/78  Pulse: 78  Temp: 97.9 F (36.6 C)   General Examination: The patient is a very pleasant 59 y.o. male in no acute distress. He is in good spirits today.   HEENT: Normocephalic, atraumatic, pupils are equal, round and reactive to light and accommodation. Funduscopic exam is normal with sharp disc margins noted. Extraocular tracking shows mild saccadic breakdown without nystagmus noted. There is no limitation to his gaze. There is mild decrease in eye blink rate. Hearing is intact. Face is symmetric with mild facial masking and normal facial sensation. There is no lip, neck or jaw tremor. Neck is moderately rigid with intact passive ROM. There are no carotid bruits on auscultation. Oropharynx exam reveals mild mouth dryness. He has a mild to moderately crowded airway in that he has a floppy appearing soft palate and elongated uvula. Mallampati is class II. Tongue protrudes centrally and palate elevates symmetrically. There is no drooling, saliva is a little thicker and does accumulate a little bit under his tongue by the angles of his mouth.   Chest: is clear to auscultation without wheezing, rhonchi or crackles  noted.  Heart: sounds are regular and normal without murmurs, rubs or gallops noted.   Abdomen: is soft, non-tender and non-distended with normal bowel sounds appreciated on auscultation.  Extremities: There mild pitting edema in the distal lower extremities bilaterally, R more than L, not much better than last time, in fact slightly worse. There is no tenderness upon deep palpation, no palpable cord.  Skin: is warm and dry with no trophic changes noted.  Musculoskeletal: exam reveals no obvious joint deformities, tenderness, joint swelling or erythema.  Neurologically:  Mental status: The patient is awake and alert, paying good  attention. He is able to completely provide the history and his wife provides details or  additional information. He is oriented to: person, place, time/date, situation, day of week, month of year and year. His memory, attention, language and knowledge are intact. There is no aphasia, agnosia, apraxia or anomia. There is a no bradyphrenia. Speech is mildly hypophonic with no dysarthria noted. Mood is congruent and affect is normal.   Cranial nerves are as described above under HEENT exam. In addition, shoulder shrug is normal, but right shoulder is a little higher than left shoulder.  Motor exam: Normal bulk, and strength for age is noted. There are no dyskinesias noted.  Tone is mildly rigid with presence of cogwheeling in the right upper extremity. There is overall mild bradykinesia. There is no drift or rebound.  There is a mild to moderate resting tremor in the right upper extremity. The tremor is fairly constant. There is no other tremor today.   Romberg is negative.   Reflexes are 1+ in the upper extremities and 1+ in the lower extremities.   Fine motor skills exam: Finger taps are moderately to severely impaired on the right and mildly impaired on the left. Hand movements are mildly impaired on the right and very mildly impaired on the left. RAP (rapid  alternating patting) is mildly impaired on the right and minimally impaired on the left. Foot taps are moderately impaired on the right and mildly impaired on the left. Foot agility (in the form of heel stomping) is mildly impaired on the right and minimally impaired on the left.    Cerebellar testing shows no dysmetria or intention tremor on finger to nose testing. Heel to shin is unremarkable bilaterally. There is no truncal or gait ataxia.   Sensory exam is intact to light touch, pinprick, temperature and vibration sense in both upper and lower extremities.  Gait, station and balance: He stands up from the seated position with no significant difficulty and does not need to push up with His hands. He needs no assistance. No veering to one side is noted. He is noted to lean slightly to the R side. Posture is mildly stooped for age. Stance is narrow-based. He walks with slightly reduced stride length for his height and mildly reduced pace and decreased arm swing on the right. He turns in en bloc. Balance is Preserved.   Assessment and Plan:   In summary, [name] is a very pleasant 59 year old male with a history of Parkinson's disease, right-sided predominant, diagnosed in 2010 with symptoms going back to 2009. His physical exam is stable with the exception of persistent lower extremity swelling, right more than left. This could be secondary to the dopamine agonist. Because the swelling has been there for 6 months and has actually become a little worse on my exam, would like to proceed with venous Doppler testing of his lower extremities. Neurologically he has been stable. He has been able to tolerate Azilect which she has been on for years and Neupro patch which we started in December 2014. We talked about DBS surgery today as well. While I do believe that he has room for medication management and it is somewhat early on for an actual referral or consultation, I think it is important to have this  conversation early on. He is potentially interested in finding out more but is not currently eager to have a consultation. We talked about local opportunities and bigger centers in the area such as [place], [facility] and [place]. At this juncture, we'll call him back with his venous duplex test  results. I will see him back routinely in 4 months. I renewed his prescriptions. He is encouraged to call with any interim questions or concerns or e-mail me through My Chart in Epic.

## 2014-04-05 NOTE — Patient Instructions (Addendum)
We will do an ultrasound of your legs for the swelling.   I think your Parkinson's disease has remained fairly stable, which is reassuring. Nevertheless, as you know, this disease does progress with time. It can affect your balance, your memory, your mood, your bowel and bladder function, your posture, balance and walking. Overall you are doing fairly well but I do want to suggest a few things today:  Remember to drink plenty of fluid, eat healthy meals and do not skip any meals. Try to eat protein with a every meal and eat a healthy snack such as fruit or nuts in between meals. Try to keep a regular sleep-wake schedule and try to exercise daily, particularly in the form of walking, 20-30 minutes a day, if you can.   Taking your medication on schedule is key.   Try to stay active physically and mentally. Engage in social activities in your community and with your family and try to keep up with current events by reading the newspaper or watching the news. Try to do word puzzles and you may like to do word puzzles and brain games on the computer such as on [email or web address].   As far as your medications are concerned, I would like to suggest that you take your current medication with the following additional changes: No change at this time.   I would like to see you back in 4 months, sooner if we need to. Please call us with any interim questions, concerns, problems, updates or refill requests.  Our phone number is [phone]. We also have an after hours call service for urgent matters and there is a physician on-call for urgent questions, that cannot wait till the next work day. For any emergencies you know to call 911 or go to the nearest emergency room.

## 2014-04-05 NOTE — Telephone Encounter (Signed)
Called patient,unable to contact, left VM message to come in for an earlier time for today's appointment, @ 11:00 instead of 11:15.

## 2014-04-07 NOTE — Addendum Note (Signed)
Addended by: Huston FoleyATHAR,  on: 04/07/2014 02:36 PM   Modules accepted: Orders

## 2014-04-12 ENCOUNTER — Encounter: Payer: Self-pay | Admitting: Vascular Surgery

## 2014-04-13 ENCOUNTER — Ambulatory Visit (INDEPENDENT_AMBULATORY_CARE_PROVIDER_SITE_OTHER): Payer: PRIVATE HEALTH INSURANCE | Admitting: Vascular Surgery

## 2014-04-13 ENCOUNTER — Ambulatory Visit (HOSPITAL_COMMUNITY)
Admission: RE | Admit: 2014-04-13 | Discharge: 2014-04-13 | Disposition: A | Discharge status: Home / Self Care | Payer: PRIVATE HEALTH INSURANCE | Source: Ambulatory Visit | Attending: Vascular Surgery | Admitting: Vascular Surgery

## 2014-04-13 ENCOUNTER — Other Ambulatory Visit (HOSPITAL_COMMUNITY): Payer: Self-pay | Admitting: Family Medicine

## 2014-04-13 ENCOUNTER — Encounter: Payer: Self-pay | Admitting: Vascular Surgery

## 2014-04-13 ENCOUNTER — Other Ambulatory Visit: Payer: Self-pay | Admitting: *Deleted

## 2014-04-13 VITALS — BP 129/79 | HR 84 | Resp 16 | Ht 74.5 in | Wt 251.0 lb

## 2014-04-13 DIAGNOSIS — R609 Edema, unspecified: Secondary | ICD-10-CM

## 2014-04-13 DIAGNOSIS — R600 Localized edema: Secondary | ICD-10-CM

## 2014-04-13 DIAGNOSIS — G20 Parkinson's disease: Secondary | ICD-10-CM

## 2014-04-13 DIAGNOSIS — M7989 Other specified soft tissue disorders: Secondary | ICD-10-CM

## 2014-04-13 NOTE — Progress Notes (Signed)
[facility]  Patient name: [name] MRN: [id number] DOB: [birth year] Sex: male  REASON FOR CONSULT: right leg swelling  HPI: [name] is a 59 y.o. male who was hit by a baseball in October 2015 in the medial aspect of his right leg. He had significant swelling then which ultimately improved significantly. However he had some increased swelling recently and therefore was referred for vascular evaluation. He is unaware of any previous history of DVT or phlebitis. He states that recently he has not had significant leg swelling. He denies any history of claudication or rest pain.  He does have a history of Parkinson's disease. Otherwise his history is fairly unremarkable.   Past Medical History  Diagnosis Date  . Calculus of kidney   . Sleep disturbance, unspecified   . Lesion of ulnar nerve   . Paralysis agitans    Family History  Problem Relation Age of Onset  . Heart failure Father   . Cancer Father   . Diabetes Father   . Sleep apnea Mother   . Sleep apnea Brother    SOCIAL HISTORY: History  Substance Use Topics  . Smoking status: Never Smoker   . Smokeless tobacco: Never Used  . Alcohol Use: 1.8 oz/week    3 Standard drinks or equivalent per week   No Known Allergies Current Outpatient Prescriptions  Medication Sig Dispense Refill  . rasagiline (AZILECT) 1 MG TABS tablet TAKE 1 TABLET (1 MG TOTAL) BY MOUTH DAILY. 30 tablet 5  . rotigotine (NEUPRO) 6 MG/24HR Place 1 patch onto the skin daily. 30 patch 5   No current facility-administered medications for this visit.   REVIEW OF SYSTEMS: Arly.Keller[X ] denotes positive finding; [  ] denotes negative finding  CARDIOVASCULAR:  [ ]  chest pain   [ ]  chest pressure   [ ]  palpitations   [ ]  orthopnea   [ ]  dyspnea on exertion   [ ]  claudication   [ ]  rest pain   [ ]  DVT   [ ]  phlebitis PULMONARY:   [ ]  productive cough   [ ]  asthma   [ ]  wheezing NEUROLOGIC:   [ ]  weakness  [ ]  paresthesias  [  ] aphasia  [ ]  amaurosis  [ ]  dizziness HEMATOLOGIC:   [ ]  bleeding problems   [ ]  clotting disorders MUSCULOSKELETAL:  [ ]  joint pain   [ ]  joint swelling Arly.Keller[X ] leg swelling GASTROINTESTINAL: [ ]   blood in stool  [ ]   hematemesis GENITOURINARY:  [ ]   dysuria  [ ]   hematuria PSYCHIATRIC:  [ ]  history of major depression INTEGUMENTARY:  [ ]  rashes  [ ]  ulcers CONSTITUTIONAL:  [ ]  fever   [ ]  chills  PHYSICAL EXAM: Filed Vitals:   04/13/14 1528  BP: 129/79  Pulse: 84  Resp: 16  Height: 6' 2.5" (1.892 m)  Weight: 251 lb (113.853 kg)   Body mass index is 31.81 kg/(m^2). GENERAL: The patient is a well-nourished male, in no acute distress. The vital signs are documented above. CARDIOVASCULAR: There is a regular rate and rhythm. I do not detect carotid bruits. He has palpable femoral, dorsalis pedis, and posterior tibial pulses bilaterally. PULMONARY: There is good air exchange bilaterally without wheezing or rales. ABDOMEN: Soft and non-tender with normal pitched bowel sounds.  MUSCULOSKELETAL: There are no major deformities or cyanosis. NEUROLOGIC: No focal weakness or paresthesias are detected. SKIN: There are no ulcers or rashes noted. PSYCHIATRIC:  The patient has a normal affect.  DATA:  I have independently interpreted his venous duplex scan. This shows no evidence of DVT in the right lower extremity. There is only some mild reflux in the proximal right greater saphenous vein but the remainder the saphenous vein shows no evidence of reflux. There is no deep vein reflux.  MEDICAL ISSUES: RIGHT LEG SWELLING: His right leg swelling has resolved. I have reassured him that his duplex shows no evidence of DVT or phlebitis and no significant chronic venous insufficiency. Likewise he has excellent arterial flow. I be happy to see him back at any time if any new vascular issues arise.   , S [facility]: [phone]

## 2014-08-02 ENCOUNTER — Ambulatory Visit (INDEPENDENT_AMBULATORY_CARE_PROVIDER_SITE_OTHER): Payer: PRIVATE HEALTH INSURANCE | Admitting: Neurology

## 2014-08-02 ENCOUNTER — Encounter: Payer: Self-pay | Admitting: Neurology

## 2014-08-02 ENCOUNTER — Telehealth: Payer: Self-pay | Admitting: Neurology

## 2014-08-02 ENCOUNTER — Ambulatory Visit: Payer: PRIVATE HEALTH INSURANCE | Admitting: Neurology

## 2014-08-02 VITALS — BP 128/84 | HR 72 | Resp 18 | Ht 74.0 in | Wt 253.0 lb

## 2014-08-02 DIAGNOSIS — G20 Parkinson's disease: Secondary | ICD-10-CM

## 2014-08-02 DIAGNOSIS — R600 Localized edema: Secondary | ICD-10-CM | POA: Diagnosis not present

## 2014-08-02 MED ORDER — ROTIGOTINE 8 MG/24HR TD PT24: 8.0000 mg | MEDICATED_PATCH | Freq: Every day | TRANSDERMAL | Status: DC

## 2014-08-02 NOTE — Progress Notes (Addendum)
Subjective:    Patient ID: [name] is a 59 y.o. male.  HPI     Interim history:   [name] is a 59 year old right-handed gentleman with an underlying history of hyperlipidemia and kidney stones, who presents for followup consultation of his right-sided predominant Parkinson's disease. He is unaccompanied today. I last saw him on 04/05/2014, at which time he reported feeling fairly stable but his wife felt his balance was not as good. He was working out with a Clinical research associate. He had picked up boxing. He was working full-time. He cut back on his sodas. I kept him on his medications, Neupro patch 6 mg and Azilect 1 mg. He had some ongoing issues with lower extremity swelling and I referred him to vascular surgery for consultation. He was seen by a vascular specialist, Dr. Scot Dock on 04/13/2014. He had Doppler studies to his lower extremities and was advised he had no DVT and good arterial flow. His swelling improved.   Today, 08/02/2014: He reports doing overall fairly well. He has noted his leg swelling to be slightly worse. His right hand tremor has become a little worse overall as well. Cognitively and mood wise he feels stable. He is active physically and continues to work out with a Clinical research associate. He is active with his children as well, playing baseball. He is also working full-time and things are going well. His balance sometimes is a little off but generally speaking he feels stable. He may not always drink enough water. He has cut back on his caffeine to 1 caffeine drink per day.  Previously:   I saw him on 12/03/2013, at which time he reported doing well and had noted a benefit from Neupro patch 6 mg. I continued him on this dose as well as Azilect 1 mg once daily. His exam was stable.   I saw him on 07/28/2013, at which time he reported some worsening of his tremor and his gait. He was able to tolerate neupro patch 4 mg strength. He has had some skin irritation, most likely from the adhesive.  He was not exercising regularly but endorsed being active and working full-time. His memory was stable. He denies any impulse control disorder but had mild daytime somnolence which was not as severe as when he was on Requip long-acting.    I saw him on 02/05/2013, at which time I felt his physical exam was a little worse since stopping Requip XL. He had stopped this because of daytime somnolence and severe nausea. I suggested we start him on Neupro patch. I provided him with samples. We talked about doing a sleep study down the [place].    I first met him on 07/15/2012, at which time I suggested he start taking coenzyme Q 10. I also suggested that he switch his Requip XL 4 mg to nighttime because of report of daytime somnolence. I did not increase make any other changes to his medications and felt that he was overall stable.    He previously followed with Dr. Jeneen Rinks love and was last seen by him on 03/17/2012 at which time Dr. Erling Cruz increase his Requip XL and continued him on Azilect. He was diagnosed in 12/2008, and Sx go back to a year prior to that.   MRI brain without contrast was done in the past. He has been on rasagiline, which improved his tremor. 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 There is no family history of tremor. There  is no history of REM behavior disorder. He has had no falls, or hallucinations or involuntary movements otherwise. He exercises not very regularly. His memory and mood have been stable. He has no problems with lower extremities swelling or compulsive thoughts or gambling. He works full-time as a Engineer, site.   His Past Medical History Is Significant For: Past Medical History  Diagnosis Date  . Calculus of kidney   . Sleep disturbance, unspecified   . Lesion of ulnar nerve   . Paralysis agitans     His Past Surgical History Is Significant For: Past Surgical History  Procedure Laterality Date  . Kidney stones  1991     His Family History Is Significant For: Family History  Problem Relation Age of Onset  . Heart failure Father   . Cancer Father   . Diabetes Father   . Sleep apnea Mother   . Sleep apnea Brother     His Social History Is Significant For: History   Social History  . Marital Status: Married    Spouse Name: N/A  . Number of Children: N/A  . Years of Education: N/A   Social History Main Topics  . Smoking status: Never Smoker   . Smokeless tobacco: Never Used  . Alcohol Use: 1.8 oz/week    3 Standard drinks or equivalent per week  . Drug Use: No  . Sexual Activity: Not on file   Other Topics Concern  . None   Social History Narrative   Consumes 1 soda a day     His Allergies Are:  No Known Allergies:   His Current Medications Are:  Outpatient Encounter Prescriptions as of 08/02/2014  Medication Sig  . rasagiline (AZILECT) 1 MG TABS tablet TAKE 1 TABLET (1 MG TOTAL) BY MOUTH DAILY.  . rotigotine (NEUPRO) 6 MG/24HR Place 1 patch onto the skin daily.   No facility-administered encounter medications on file as of 08/02/2014.  :  Review of Systems:  Out of a complete 14 point review of systems, all are reviewed and negative with the exception of these symptoms as listed below:  Review of Systems  All other systems reviewed and are negative.   Objective:  Neurologic Exam  Physical Exam Physical Examination:   Filed Vitals:   08/02/14 1120  BP: 128/84  Pulse: 72  Resp: 18   General Examination: The patient is a very pleasant 59 y.o. male in no acute distress. He is in good spirits today.   HEENT: Normocephalic, atraumatic, pupils are equal, round and reactive to light and accommodation. Funduscopic exam is normal with sharp disc margins noted. Extraocular tracking shows mild saccadic breakdown without nystagmus noted. There is no limitation to his gaze. There is mild decrease in eye blink rate. Hearing is intact. Face is symmetric with mild facial masking and  normal facial sensation. There is no lip, neck or jaw tremor. Neck is moderately rigid with intact passive ROM. There are no carotid bruits on auscultation. Oropharynx exam reveals mild mouth dryness. He has a mild to moderately crowded airway in that he has a floppy appearing soft palate and elongated uvula. Mallampati is class II. Tongue protrudes centrally and palate elevates symmetrically. There is no drooling.   Chest: is clear to auscultation without wheezing, rhonchi or crackles noted.  Heart: sounds are regular and normal without murmurs, rubs or gallops noted.   Abdomen: is soft, non-tender and non-distended with normal bowel sounds appreciated on auscultation.  Extremities: There mild pitting edema in the  distal lower extremities bilaterally, R more than L, not much better than last time, in fact slightly worse from last time, mild hyperpigmentation noted. There is no tenderness upon deep palpation, no palpable cord.  Skin: is warm and dry with no trophic changes noted.  Musculoskeletal: exam reveals no obvious joint deformities, tenderness, joint swelling or erythema.  Neurologically:  Mental status: The patient is awake and alert, paying good  attention. He is able to completely provide the history and his wife provides details or additional information. He is oriented to: person, place, time/date, situation, day of week, month of year and year. His memory, attention, language and knowledge are intact. There is no aphasia, agnosia, apraxia or anomia. There is a no bradyphrenia. Speech is mildly hypophonic with no dysarthria noted. Mood is congruent and affect is normal.   Cranial nerves are as described above under HEENT exam. In addition, shoulder shrug is normal, but right shoulder is a little higher than left shoulder.  Motor exam: Normal bulk, and strength for age is noted. There are no dyskinesias noted.  Tone is mildly rigid with presence of cogwheeling in the right upper  extremity. There is overall mild bradykinesia. There is no drift or rebound.  There is a moderate resting tremor in the right upper extremity. The tremor is fairly constant. There is no other tremor today.    Romberg is negative.   Reflexes are 1+ in the upper extremities and 1+ in the lower extremities.   Fine motor skills exam: Finger taps are moderately to severely impaired on the right and mildly impaired on the left. Hand movements are mild to moderately impaired on the right and very mildly impaired on the left. RAP (rapid alternating patting) is mildly impaired on the right and minimally impaired on the left. Foot taps are moderately impaired on the right and mildly impaired on the left. Foot agility (in the form of heel stomping) is mildly impaired on the right and minimally impaired on the left.    Cerebellar testing shows no dysmetria or intention tremor on finger to nose testing. Heel to shin is unremarkable bilaterally. There is no truncal or gait ataxia.   Sensory exam is intact to light touch in both upper and lower extremities.  Gait, station and balance: He stands up from the seated position with no significant difficulty and does not need to push up with His hands. He needs no assistance. No veering to one side is noted. He is noted to lean slightly to the R side. Posture is mildly stooped for age. Stance is narrow-based. He walks with slightly reduced stride length for his height and mildly reduced pace and decreased arm swing on the right. He turns in en bloc. Balance is Preserved.   Assessment and Plan:   In summary, [name] is a very pleasant 59 year old male with a history of Parkinson's disease, right-sided predominant, diagnosed in 2010 with symptoms going back to 2009. His physical exam has been fairly stable but he does have persistent lower extremity swelling which seems slightly worse from last time and it has always been worse on the right side. He has been  checked out for this he had Doppler ultrasound to his lower extremities and met with the vascular surgeon was reassured. This could be secondary to the dopamine agonist I explained to him. We need to monitor this into the future (swelling can get worse and seems to be dose dependent - patient made aware). As far  as his motor exam, he does have more pronounced tremor and this is bothersome to him. We mutually agreed to increase his dopamine agonist to neupro 8 mg once daily. We will continue with Azilect 1 mg once daily. He has been on this for some time. Neupro patch was started in December 2014. I asked him to continue to stay active. He is encouraged to drink more water. He is also encouraged to start using compression socks to his lower extremities and keep his legs elevated when sedentary, if possible. I would like to see him back in 4 months, sooner if needed. I answered all his questions today and he was in agreement.  I spent 20 minutes in total face-to-face time with the patient, more than 50% of which was spent in counseling and coordination of care, reviewing test results, reviewing medication and discussing or reviewing the diagnosis of PD, its prognosis and treatment options.

## 2014-08-02 NOTE — Telephone Encounter (Signed)
Spoke to patient. He was grateful and verbalized understanding of Dr. Teofilo Pod instructions in previous note. Patient will come by the office tomorrow to pick up samples.

## 2014-08-02 NOTE — Telephone Encounter (Signed)
I just saw patient this morning and we agreed to increase his neupro patch to 8 mg daily from 6 mg. I have samples for the 4 mg and was wondering, if he is interested in picking up 2 weeks worth of samples. That way, he can try the 8 mg without having to fill prescription and see if his leg swelling does get worse. I looked into the swelling as a side effect. It can indeed get worse, dose dependent, when you increase Neupro from 6 to 8 mg. Of note, the sample boxes are for the 4 mg strength. He would have to use 2 patches, next to each other to make 8 mg. He can get 4 boxes, which would be a total of 28 patches, lasting him 2 weeks. Also: expiration dates are 08/06/14, but these stay good for several months. We cannot give them out AFTER the Exp dates, however.  Can you call him to discuss? thx

## 2014-08-02 NOTE — Patient Instructions (Signed)
We will increase your Neupro to 8 mg once daily. Please monitor your leg swelling. Start using your compression socks.

## 2014-09-22 ENCOUNTER — Other Ambulatory Visit: Payer: Self-pay | Admitting: Neurology

## 2014-10-28 ENCOUNTER — Telehealth: Payer: Self-pay | Admitting: Neurology

## 2014-10-28 DIAGNOSIS — G20 Parkinson's disease: Secondary | ICD-10-CM

## 2014-10-28 DIAGNOSIS — G20A1 Parkinson's disease without dyskinesia, without mention of fluctuations: Secondary | ICD-10-CM

## 2014-10-28 NOTE — Telephone Encounter (Signed)
Patient called said he is in between healthcare plans and is inquiring if he could get samples of AZILECT 1 MG TABS tablet and Rotigotine (NEUPRO) 8 MG/24HR PT24 . He will be out of these next Friday. Insurance effective date is 11/12/14. Patient advised Dr Frances Furbish is out of the office until Monday . Please call and advise. Patient can be reached at [phone]

## 2014-10-28 NOTE — Telephone Encounter (Signed)
Please see if we have 2 week samples for Azilect 1 mg (14 pills) and Neupro 8 mg (14 patches). If so, pls enter 2 separate 14 day Rx wo refills and enter comment: sample and print for me. We can call him on Monday for pick up.

## 2014-10-31 MED ORDER — ROTIGOTINE 8 MG/24HR TD PT24: 8.0000 mg | MEDICATED_PATCH | Freq: Every day | TRANSDERMAL | Status: DC

## 2014-10-31 MED ORDER — RASAGILINE MESYLATE 1 MG PO TABS: 1.0000 mg | ORAL_TABLET | Freq: Every day | ORAL | Status: DC

## 2014-10-31 NOTE — Telephone Encounter (Signed)
I spoke to patient and he is aware that samples are being put up at the front desk for him.

## 2014-10-31 NOTE — Telephone Encounter (Signed)
We do have 2 week samples of both medications. Rx for samples has been printed. I have the samples at my desk for the patient. I will call him after 8 am this morning to advise him to p/u at front desk.

## 2014-12-01 ENCOUNTER — Other Ambulatory Visit: Payer: Self-pay

## 2014-12-01 ENCOUNTER — Encounter: Payer: Self-pay | Admitting: Neurology

## 2014-12-01 ENCOUNTER — Ambulatory Visit (INDEPENDENT_AMBULATORY_CARE_PROVIDER_SITE_OTHER): Payer: Managed Care, Other (non HMO) | Admitting: Neurology

## 2014-12-01 VITALS — BP 122/72 | HR 72 | Resp 18 | Ht 74.0 in | Wt 256.0 lb

## 2014-12-01 DIAGNOSIS — G20 Parkinson's disease: Secondary | ICD-10-CM

## 2014-12-01 MED ORDER — RASAGILINE MESYLATE 1 MG PO TABS: 1.0000 mg | ORAL_TABLET | Freq: Every day | ORAL | Status: DC

## 2014-12-01 MED ORDER — ROTIGOTINE 8 MG/24HR TD PT24: 8.0000 mg | MEDICATED_PATCH | Freq: Every day | TRANSDERMAL | Status: DC

## 2014-12-01 NOTE — Patient Instructions (Signed)
We will keep your medications the same.   We will do a 6 months FU.   Monitor your leg swelling, I think it is stable.   We may consider a third medication in the future.

## 2014-12-01 NOTE — Progress Notes (Signed)
Subjective:    Patient ID: [name] is a 59 y.o. male.  HPI     Interim history:   [name] is a 59 year old right-handed gentleman with an underlying history of hyperlipidemia and kidney stones, who presents for followup consultation of his right-sided predominant Parkinson's disease. He is unaccompanied today. I last saw him on 08/02/2014, at which time he reported doing well. He had some leg swelling. His right hand swelling was a little worse. Cognitively and mood wise he was stable. Balance may have been off at times but generally speaking he was doing well.   Today, 12/01/2014: He reports that he recently changed insurances and needs his prescriptions sent to new pharmacy. Otherwise, he feels fairly stable. Swelling is stable. Memory and mood are stable. Active with his teenage boys, 57 yo and 76 yo. His wife has had a change in her job after 3 years and loves her new job. He is working successfully.   Previously:  I saw him on 04/05/2014, at which time he reported feeling fairly stable but his wife felt his balance was not as good. He was working out with a Clinical research associate. He had picked up boxing. He was working full-time. He cut back on his sodas. I kept him on his medications, Neupro patch 6 mg and Azilect 1 mg. He had some ongoing issues with lower extremity swelling and I referred him to vascular surgery for consultation. He was seen by a vascular specialist, Dr. Scot Dock on 04/13/2014. He had Doppler studies to his lower extremities and was advised he had no DVT and good arterial flow. His swelling improved.   I saw him on 12/03/2013, at which time he reported doing well and had noted a benefit from Neupro patch 6 mg. I continued him on this dose as well as Azilect 1 mg once daily. His exam was stable.  I saw him on 07/28/2013, at which time he reported some worsening of his tremor and his gait. He was able to tolerate neupro patch 4 mg strength. He has had some skin irritation,  most likely from the adhesive. He was not exercising regularly but endorsed being active and working full-time. His memory was stable. He denies any impulse control disorder but had mild daytime somnolence which was not as severe as when he was on Requip long-acting.    I saw him on 02/05/2013, at which time I felt his physical exam was a little worse since stopping Requip XL. He had stopped this because of daytime somnolence and severe nausea. I suggested we start him on Neupro patch. I provided him with samples. We talked about doing a sleep study down the [place].    I first met him on 07/15/2012, at which time I suggested he start taking coenzyme Q 10. I also suggested that he switch his Requip XL 4 mg to nighttime because of report of daytime somnolence. I did not increase make any other changes to his medications and felt that he was overall stable.    He previously followed with Dr. Jeneen Rinks love and was last seen by him on 03/17/2012 at which time Dr. Erling Cruz increase his Requip XL and continued him on Azilect. He was diagnosed in 12/2008, and Sx go back to a year prior to that.   MRI brain without contrast was done in the past. He has been on rasagiline, which improved his tremor. 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 There is  no family history of tremor. There is no history of REM behavior disorder. He has had no falls, or hallucinations or involuntary movements otherwise. He exercises not very regularly. His memory and mood have been stable. He has no problems with lower extremities swelling or compulsive thoughts or gambling. He works full-time as a Engineer, site.     His Past Medical History Is Significant For: Past Medical History  Diagnosis Date  . Calculus of kidney   . Sleep disturbance, unspecified   . Lesion of ulnar nerve   . Paralysis agitans ([place])     His Past Surgical History Is Significant For: Past Surgical History  Procedure  Laterality Date  . Kidney stones  1991    His Family History Is Significant For: Family History  Problem Relation Age of Onset  . Heart failure Father   . Cancer Father   . Diabetes Father   . Sleep apnea Mother   . Sleep apnea Brother     His Social History Is Significant For: Social History   Social History  . Marital Status: Married    Spouse Name: N/A  . Number of Children: N/A  . Years of Education: N/A   Social History Main Topics  . Smoking status: Never Smoker   . Smokeless tobacco: Never Used  . Alcohol Use: 1.8 oz/week    3 Standard drinks or equivalent per week  . Drug Use: No  . Sexual Activity: Not Asked   Other Topics Concern  . None   Social History Narrative   Consumes 1 soda a day     His Allergies Are:  No Known Allergies:   His Current Medications Are:  Outpatient Encounter Prescriptions as of 12/01/2014  Medication Sig  . rasagiline (AZILECT) 1 MG TABS tablet Take 1 tablet (1 mg total) by mouth daily.  . Rotigotine (NEUPRO) 8 MG/24HR PT24 Place 8 mg onto the skin daily.  . [DISCONTINUED] rasagiline (AZILECT) 1 MG TABS tablet TAKE 1 TABLET (1 MG TOTAL) BY MOUTH DAILY.   No facility-administered encounter medications on file as of 12/01/2014.  :  Review of Systems:  Out of a complete 14 point review of systems, all are reviewed and negative with the exception of these symptoms as listed below:   Review of Systems  Neurological:       Patient reports no new concerns. Has new insurance and asks for prescriptions to be sent to new pharmacy. May need samples, he is wearing last patch today.     Objective:  Neurologic Exam  Physical Exam Physical Examination:   Filed Vitals:   12/01/14 1546  BP: 122/72  Pulse: 72  Resp: 18   General Examination: The patient is a very pleasant 59 y.o. male in no acute distress. He is in good spirits today.   HEENT: Normocephalic, atraumatic, pupils are equal, round and reactive to light and  accommodation. Funduscopic exam is normal with sharp disc margins noted. Extraocular tracking shows mild saccadic breakdown without nystagmus noted. There is no limitation to his gaze. There is mild decrease in eye blink rate. Hearing is intact. Face is symmetric with mild facial masking and normal facial sensation. There is no lip, neck or jaw tremor. Neck is moderately rigid with intact passive ROM. There are no carotid bruits on auscultation. Oropharynx exam reveals mild mouth dryness. He has a mild to moderately crowded airway in that he has a floppy appearing soft palate and elongated uvula. Mallampati is class II. Tongue protrudes  centrally and palate elevates symmetrically. There is no drooling.   Chest: is clear to auscultation without wheezing, rhonchi or crackles noted.  Heart: sounds are regular and normal without murmurs, rubs or gallops noted.   Abdomen: is soft, non-tender and non-distended with normal bowel sounds appreciated on auscultation.  Extremities: There mild pitting edema in the distal lower extremities bilaterally, R more than L, stable from last time, mild hyperpigmentation noted. There is no tenderness upon deep palpation, no palpable cord.  Skin: is warm and dry with no trophic changes noted.  Musculoskeletal: exam reveals no obvious joint deformities, tenderness, joint swelling or erythema.  Neurologically:  Mental status: The patient is awake and alert, paying good  attention. He is able to completely provide the history and his wife provides details or additional information. He is oriented to: person, place, time/date, situation, day of week, month of year and year. His memory, attention, language and knowledge are intact. There is no aphasia, agnosia, apraxia or anomia. There is a no bradyphrenia. Speech is mildly hypophonic with no dysarthria noted. Mood is congruent and affect is normal.   Cranial nerves are as described above under HEENT exam. In addition,  shoulder shrug is normal, but right shoulder is a little higher than left shoulder.  Motor exam: Normal bulk, and strength for age is noted. There are no dyskinesias noted.  Tone is mildly rigid with presence of cogwheeling in the right upper extremity. There is overall mild bradykinesia. There is no drift or rebound.  There is a moderate resting tremor in the right upper extremity. The tremor is fairly constant. There is no other tremor today.    Romberg is negative.   Reflexes are 1+ in the upper extremities and 1+ in the lower extremities.    Fine motor skills exam: Finger taps are moderately impaired on the right and mild to moderately impaired on the left. Hand movements are moderately impaired on the right and mildly impaired on the left. RAP (rapid alternating patting) is mildly impaired on the right and minimally impaired on the left. Foot taps are moderately impaired on the right and mildly impaired on the left. Foot agility (in the form of heel stomping) is mildly impaired on the right and minimally impaired on the left.    Cerebellar testing shows no dysmetria or intention tremor on finger to nose testing. Heel to shin is unremarkable bilaterally. There is no truncal or gait ataxia.   Sensory exam is intact to light touch in both upper and lower extremities.  Gait, station and balance: He stands up from the seated position with no significant difficulty and does not need to push up with His hands. He needs no assistance. No veering to one side is noted. He is noted to lean slightly to the R side. Posture is mildly stooped for age. Stance is narrow-based. He walks with slightly reduced stride length for his height and mildly reduced pace and decreased arm swing on the right. He turns in en bloc. Balance is Preserved.   Assessment and Plan:   In summary, [name] is a very pleasant 59 year old male with a history of Parkinson's disease, right-sided predominant, diagnosed in 2010  with symptoms going back to 2009. His physical exam has been fairly stable but he does have persistent lower extremity swelling which is stable from last time. I have asked him to monitor this. He has had a Doppler ultrasound to his lower extremities and met with the vascular surgeon.  The swelling could be secondary to the dopamine agonist I explained to him. We need to monitor this into the future (swelling can get worse and seems to be dose dependent - patient made aware). As far as his motor exam, he has been fairly stable and we increased the Neupro last time on 08/02/14. We will continue with Neupro 8 mg once daily and Azilect 1 mg once daily. He has been on this for some time since 2010, when he was seen in [facility] by a neurologist originally. Neupro patch was started in December 2014 by me. He had SE on Requip XL.  I asked him to continue to stay active. He is encouraged to drink more water. I would like to see him back in 6 months, sooner if needed. I answered all his questions today and he was in agreement.  I spent 20 minutes in total face-to-face time with the patient, more than 50% of which was spent in counseling and coordination of care, reviewing test results, reviewing medication and discussing or reviewing the diagnosis of PD, its prognosis and treatment options.

## 2014-12-01 NOTE — Telephone Encounter (Signed)
Opened in error

## 2014-12-01 NOTE — Telephone Encounter (Signed)
Azilect 1mg . 1 Tab a day. LOT [id number], Exp 1/19.  Rotigotine Patch 8mg /24HR, place 8mg  onto skin daily. LOT [id number], Exp 06/12/16.

## 2014-12-01 NOTE — Telephone Encounter (Signed)
Patient is here for office visit. He requested samples due to getting new insurance and having to switch pharmacies.

## 2014-12-16 ENCOUNTER — Encounter: Payer: Self-pay | Admitting: Podiatry

## 2014-12-16 ENCOUNTER — Ambulatory Visit (INDEPENDENT_AMBULATORY_CARE_PROVIDER_SITE_OTHER): Payer: Managed Care, Other (non HMO) | Admitting: Podiatry

## 2014-12-16 VITALS — BP 135/93 | HR 86 | Resp 14

## 2014-12-16 DIAGNOSIS — M71571 Other bursitis, not elsewhere classified, right ankle and foot: Secondary | ICD-10-CM | POA: Diagnosis not present

## 2014-12-16 DIAGNOSIS — M7751 Other enthesopathy of right foot: Secondary | ICD-10-CM

## 2014-12-16 NOTE — Progress Notes (Signed)
   Subjective:    Patient ID: Terry DoppJerry R Doris, male    DOB: 01-Oct-1955, 59 y.o.   MRN: [id number]  HPI  this patient presents to the office with chief complaint of painful right forefoot. He states that the pain has been present for approximately 2 weeks which probably was aggravated by his golf playing. He states that the area has become painful as he walks during the course of the day. He does have a history of Parkinson's, for which he is being treated. He denies any history of trauma or injury to the foot  . He presents to the office today for an evaluation and treatment of this condition     Review of Systems  HENT:       Ringing in ears  Cardiovascular: Positive for leg swelling.  All other systems reviewed and are negative.      Objective:   Physical Exam GENERAL APPEARANCE: Alert, conversant. Appropriately groomed. No acute distress.  VASCULAR: Pedal pulses palpable at  [facility] bilateral.  Capillary refill time is immediate to all digits,  Normal temperature gradient.  Digital hair growth is present bilateral  NEUROLOGIC: sensation is normal to 5.07 monofilament at 5/5 sites bilateral.  Light touch is intact bilateral, Muscle strength normal.  MUSCULOSKELETAL: acceptable muscle strength, tone and stability bilateral.  Intrinsic muscluature intact bilateral.  Rectus appearance of foot and digits noted bilateral. Limited ankle ROM B/L.  Hallux limitus 1st MPJ right foot. Palpable pain second and third metatarsal head right foot. No eviden ce of redness or swelling or increased temperature.  DERMATOLOGIC: skin color, texture, and turgor are within normal limits.  No preulcerative lesions or ulcers  are seen, no interdigital maceration noted.  No open lesions present.  Digital nails are asymptomatic. No drainage noted. Pinch callus noted right hallux.         Assessment & Plan:  R  ight foot bursitis secondary to hallux limitus.    IE  Dispense purestrides with metatarsal pad  applied to right orthoses.  Watched this patient walk and he walks on the balls of both feet which he related to Parkinsons.  RTC prn.

## 2014-12-30 ENCOUNTER — Ambulatory Visit (INDEPENDENT_AMBULATORY_CARE_PROVIDER_SITE_OTHER): Payer: Managed Care, Other (non HMO)

## 2014-12-30 ENCOUNTER — Encounter: Payer: Self-pay | Admitting: Podiatry

## 2014-12-30 ENCOUNTER — Ambulatory Visit (INDEPENDENT_AMBULATORY_CARE_PROVIDER_SITE_OTHER): Payer: Managed Care, Other (non HMO) | Admitting: Podiatry

## 2014-12-30 VITALS — BP 133/81 | HR 73 | Resp 16

## 2014-12-30 DIAGNOSIS — M779 Enthesopathy, unspecified: Secondary | ICD-10-CM | POA: Diagnosis not present

## 2014-12-30 DIAGNOSIS — M2041 Other hammer toe(s) (acquired), right foot: Secondary | ICD-10-CM | POA: Diagnosis not present

## 2014-12-30 DIAGNOSIS — M79671 Pain in right foot: Secondary | ICD-10-CM | POA: Diagnosis not present

## 2014-12-30 MED ORDER — TRIAMCINOLONE ACETONIDE 10 MG/ML IJ SUSP
10.0000 mg | Freq: Once | INTRAMUSCULAR | Status: AC
  Administered 2014-12-30: 10 mg

## 2014-12-31 NOTE — Progress Notes (Signed)
Subjective:     Patient ID: [name], male   DOB: 10-Apr-1955, 59 y.o.   MRN: [id number]  HPI patient states I saw Dr. Stacie AcresMayer and I'm still having a lot of pain in my foot and the pad that he gave me has not been effective. I do feel like my second toe has moved   Review of Systems     Objective:   Physical Exam Neurovascular status intact with patient with Parkinson's disease was still quite active who has inflammation and fluid at the second MPJ right with mild medial and dorsal dislocation of the second toe and quite a bit of discomfort when the second MPJ is palpated    Assessment:     Inflammatory capsulitis second MPJ right with possibility for flexor plate stretch or tear    Plan:     H&P and conditions reviewed with patient. At this time we're in a try conservative but more aggressive type treatment and I did explain risk of aspiration injection of this joint. I went ahead did a proximal nerve block aspirated the second MPJ getting out of a small amount of fluid that did have a small amount of blood and it indicating possible tear or disruption of the capsule. I then went ahead and injected with a quarter cc dexamethasone Kenalog to reduce inflammation and applied thick plantar pad and we'll reevaluate again in the next several weeks

## 2015-01-13 ENCOUNTER — Encounter: Payer: Self-pay | Admitting: Podiatry

## 2015-01-13 ENCOUNTER — Ambulatory Visit (INDEPENDENT_AMBULATORY_CARE_PROVIDER_SITE_OTHER): Payer: Managed Care, Other (non HMO) | Admitting: Podiatry

## 2015-01-13 VITALS — BP 127/81 | HR 83 | Resp 16

## 2015-01-13 DIAGNOSIS — M779 Enthesopathy, unspecified: Secondary | ICD-10-CM | POA: Diagnosis not present

## 2015-01-13 DIAGNOSIS — M2041 Other hammer toe(s) (acquired), right foot: Secondary | ICD-10-CM

## 2015-01-15 NOTE — Progress Notes (Signed)
Subjective:     Patient ID: [name], male   DOB: 08/14/1955, 59 y.o.   MRN: [id number]  HPI patient presents stating the swelling of the second toe improved but I'm still having a lot of pain in the second metatarsophalangeal joint with fluid buildup and pain when palpated   Review of Systems     Objective:   Physical Exam Neuro vascular status was found to be intact with inflammation and pain of the second MPJ right with mild medial and dorsal rotation of the second toe at the metatarsophalangeal joint. It is very tender when pressed    Assessment:     Inflammatory capsulitis second MPJ right with probable flexor plate stretch    Plan:     H&P conditions reviewed with patient and discussed treatment options. We are going to try an aggressive conservative approach and today I mobilized with rigid air fracture walker boot therapy and scanned for custom orthotics to reduce all stress against the joint. If symptoms do not get better we will need to consider digital stabilization along with metatarsal osteotomy

## 2015-02-06 ENCOUNTER — Ambulatory Visit: Payer: Managed Care, Other (non HMO) | Admitting: Podiatry

## 2015-02-15 ENCOUNTER — Telehealth: Payer: Self-pay | Admitting: *Deleted

## 2015-02-15 NOTE — Telephone Encounter (Signed)
Pt's wife, Nicholos JohnsKathleen request copies of pt's x-rays to go for a 2nd opinion with [facility].  Left message informing Nicholos JohnsKathleen she could pick up the x-rays in the [place] office.

## 2015-04-10 ENCOUNTER — Other Ambulatory Visit: Payer: Self-pay | Admitting: Orthopedic Surgery

## 2015-05-18 ENCOUNTER — Encounter (HOSPITAL_BASED_OUTPATIENT_CLINIC_OR_DEPARTMENT_OTHER): Payer: Self-pay | Admitting: *Deleted

## 2015-05-19 ENCOUNTER — Encounter (HOSPITAL_BASED_OUTPATIENT_CLINIC_OR_DEPARTMENT_OTHER): Payer: Self-pay | Admitting: *Deleted

## 2015-05-24 NOTE — Anesthesia Preprocedure Evaluation (Addendum)
Anesthesia Evaluation  Patient identified by MRN, date of birth, ID band Patient awake    Reviewed: Allergy & Precautions, NPO status , Patient's Chart, lab work & pertinent test results  Airway Mallampati: II  TM Distance: >3 FB Neck ROM: Full    Dental  (+) Teeth Intact, Dental Advisory Given   Pulmonary neg pulmonary ROS,    Pulmonary exam normal breath sounds clear to auscultation       Cardiovascular negative cardio ROS Normal cardiovascular exam Rhythm:Regular Rate:Normal     Neuro/Psych Parkinson's disease   Neuromuscular disease (ulnar nerve lesion) negative neurological ROS     GI/Hepatic negative GI ROS, Neg liver ROS,   Endo/Other  Obesity   Renal/GU negative Renal ROS     Musculoskeletal negative musculoskeletal ROS (+)   Abdominal   Peds  Hematology negative hematology ROS (+)   Anesthesia Other Findings Day of surgery medications reviewed with the patient.  Reproductive/Obstetrics                            Anesthesia Physical Anesthesia Plan  ASA: II  Anesthesia Plan: Regional and MAC   Post-op Pain Management:  Regional for Post-op pain   Induction: Intravenous  Airway Management Planned: Nasal Cannula  Additional Equipment:   Intra-op Plan:   Post-operative Plan:   Informed Consent: I have reviewed the patients History and Physical, chart, labs and discussed the procedure including the risks, benefits and alternatives for the proposed anesthesia with the patient or authorized representative who has indicated his/her understanding and acceptance.   Dental advisory given  Plan Discussed with:   Anesthesia Plan Comments: (Risks/benefits of regional block discussed with patient including risk of bleeding, infection, nerve damage, and possibility of failed block.  Also discussed backup plan of general anesthesia and associated risks.  Patient wishes to  proceed.)        Anesthesia Quick Evaluation

## 2015-05-25 ENCOUNTER — Ambulatory Visit (HOSPITAL_BASED_OUTPATIENT_CLINIC_OR_DEPARTMENT_OTHER): Payer: Managed Care, Other (non HMO) | Admitting: Certified Registered"

## 2015-05-25 ENCOUNTER — Encounter (HOSPITAL_BASED_OUTPATIENT_CLINIC_OR_DEPARTMENT_OTHER)
Admission: RE | Disposition: A | Discharge status: Home / Self Care | Payer: Self-pay | Source: Ambulatory Visit | Attending: Orthopedic Surgery

## 2015-05-25 ENCOUNTER — Encounter (HOSPITAL_BASED_OUTPATIENT_CLINIC_OR_DEPARTMENT_OTHER): Payer: Self-pay | Admitting: *Deleted

## 2015-05-25 ENCOUNTER — Other Ambulatory Visit: Payer: Self-pay | Admitting: Orthopedic Surgery

## 2015-05-25 ENCOUNTER — Ambulatory Visit (HOSPITAL_BASED_OUTPATIENT_CLINIC_OR_DEPARTMENT_OTHER)
Admission: RE | Admit: 2015-05-25 | Discharge: 2015-05-25 | Disposition: A | Discharge status: Home / Self Care | Payer: Managed Care, Other (non HMO) | Source: Ambulatory Visit | Attending: Orthopedic Surgery | Admitting: Orthopedic Surgery

## 2015-05-25 DIAGNOSIS — M2041 Other hammer toe(s) (acquired), right foot: Secondary | ICD-10-CM | POA: Diagnosis present

## 2015-05-25 DIAGNOSIS — G20 Parkinson's disease: Secondary | ICD-10-CM | POA: Insufficient documentation

## 2015-05-25 DIAGNOSIS — E669 Obesity, unspecified: Secondary | ICD-10-CM | POA: Insufficient documentation

## 2015-05-25 DIAGNOSIS — Z9889 Other specified postprocedural states: Secondary | ICD-10-CM

## 2015-05-25 DIAGNOSIS — M7741 Metatarsalgia, right foot: Secondary | ICD-10-CM | POA: Diagnosis not present

## 2015-05-25 DIAGNOSIS — Z79899 Other long term (current) drug therapy: Secondary | ICD-10-CM | POA: Diagnosis not present

## 2015-05-25 DIAGNOSIS — Z6832 Body mass index (BMI) 32.0-32.9, adult: Secondary | ICD-10-CM | POA: Diagnosis not present

## 2015-05-25 HISTORY — PX: HAMMERTOE RECONSTRUCTION WITH WEIL OSTEOTOMY: SHX5631

## 2015-05-25 HISTORY — DX: Parkinson's disease without dyskinesia, without mention of fluctuations: G20.A1

## 2015-05-25 HISTORY — DX: Parkinson's disease: G20

## 2015-05-25 SURGERY — HAMMERTOE RECONSTRUCTION WITH WEIL OSTEOTOMY
Anesthesia: Monitor Anesthesia Care | Site: Foot | Laterality: Right

## 2015-05-25 MED ORDER — FENTANYL CITRATE (PF) 100 MCG/2ML IJ SOLN
INTRAMUSCULAR | Status: AC
  Filled 2015-05-25: qty 2

## 2015-05-25 MED ORDER — MIDAZOLAM HCL 2 MG/2ML IJ SOLN
1.0000 mg | INTRAMUSCULAR | Status: DC | PRN
  Administered 2015-05-25: 1 mg via INTRAVENOUS

## 2015-05-25 MED ORDER — FENTANYL CITRATE (PF) 100 MCG/2ML IJ SOLN
25.0000 ug | INTRAMUSCULAR | Status: DC | PRN
  Administered 2015-05-25: 50 ug via INTRAVENOUS

## 2015-05-25 MED ORDER — FENTANYL CITRATE (PF) 100 MCG/2ML IJ SOLN
50.0000 ug | INTRAMUSCULAR | Status: DC | PRN
Start: 2015-05-25 — End: 2015-05-25
  Administered 2015-05-25: 50 ug via INTRAVENOUS

## 2015-05-25 MED ORDER — SENNA 8.6 MG PO TABS: 2.0000 | ORAL_TABLET | Freq: Two times a day (BID) | ORAL | Status: DC

## 2015-05-25 MED ORDER — OXYCODONE HCL 5 MG PO TABS: 5.0000 mg | ORAL_TABLET | ORAL | Status: DC | PRN

## 2015-05-25 MED ORDER — CEFAZOLIN SODIUM-DEXTROSE 2-4 GM/100ML-% IV SOLN
INTRAVENOUS | Status: AC
  Filled 2015-05-25: qty 100

## 2015-05-25 MED ORDER — MIDAZOLAM HCL 2 MG/2ML IJ SOLN
INTRAMUSCULAR | Status: AC
  Filled 2015-05-25: qty 2

## 2015-05-25 MED ORDER — 0.9 % SODIUM CHLORIDE (POUR BTL) OPTIME
TOPICAL | Status: DC | PRN
  Administered 2015-05-25: 120 mL

## 2015-05-25 MED ORDER — CHLORHEXIDINE GLUCONATE 4 % EX LIQD: 60.0000 mL | Freq: Once | CUTANEOUS | Status: DC

## 2015-05-25 MED ORDER — GLYCOPYRROLATE 0.2 MG/ML IJ SOLN: 0.2000 mg | Freq: Once | INTRAMUSCULAR | Status: DC | PRN

## 2015-05-25 MED ORDER — LACTATED RINGERS IV SOLN
INTRAVENOUS | Status: DC
  Administered 2015-05-25: 09:00:00 via INTRAVENOUS
  Administered 2015-05-25: 10 mL/h via INTRAVENOUS

## 2015-05-25 MED ORDER — DOCUSATE SODIUM 100 MG PO CAPS: 100.0000 mg | ORAL_CAPSULE | Freq: Two times a day (BID) | ORAL | Status: DC

## 2015-05-25 MED ORDER — SODIUM CHLORIDE 0.9 % IV SOLN: INTRAVENOUS | Status: DC

## 2015-05-25 MED ORDER — MEPIVACAINE HCL 1.5 % IJ SOLN
INTRAMUSCULAR | Status: DC | PRN
Start: 2015-05-25 — End: 2015-05-25
  Administered 2015-05-25: 15 mL via PERINEURAL

## 2015-05-25 MED ORDER — PROMETHAZINE HCL 25 MG/ML IJ SOLN: 6.2500 mg | INTRAMUSCULAR | Status: DC | PRN

## 2015-05-25 MED ORDER — ONDANSETRON HCL 4 MG/2ML IJ SOLN
INTRAMUSCULAR | Status: DC | PRN
  Administered 2015-05-25: 4 mg via INTRAVENOUS

## 2015-05-25 MED ORDER — PROPOFOL 500 MG/50ML IV EMUL
INTRAVENOUS | Status: DC | PRN
  Administered 2015-05-25: 100 ug/kg/min via INTRAVENOUS

## 2015-05-25 MED ORDER — DEXTROSE 5 % IV SOLN
2.0000 g | INTRAVENOUS | Status: AC
  Administered 2015-05-25: 2 g via INTRAVENOUS

## 2015-05-25 MED ORDER — LIDOCAINE HCL (CARDIAC) 20 MG/ML IV SOLN
INTRAVENOUS | Status: DC | PRN
  Administered 2015-05-25: 30 mg via INTRAVENOUS

## 2015-05-25 MED ORDER — BUPIVACAINE-EPINEPHRINE (PF) 0.5% -1:200000 IJ SOLN
INTRAMUSCULAR | Status: DC | PRN
  Administered 2015-05-25: 15 mL via PERINEURAL

## 2015-05-25 MED ORDER — SCOPOLAMINE 1 MG/3DAYS TD PT72: 1.0000 | MEDICATED_PATCH | Freq: Once | TRANSDERMAL | Status: DC | PRN

## 2015-05-25 SURGICAL SUPPLY — 76 items
BANDAGE ESMARK 6X9 LF (GAUZE/BANDAGES/DRESSINGS) ×1 IMPLANT
BIT DRILL CANN 2.4 (BIT) ×2
BIT DRILL CANN 2.4MM (BIT) ×1
BIT DRILL CANN MAX VPC 2.4 (BIT) ×1 IMPLANT
BLADE AVERAGE 25MMX9MM (BLADE)
BLADE AVERAGE 25X9 (BLADE) IMPLANT
BLADE LONG MED 25X9 (BLADE) ×2 IMPLANT
BLADE LONG MED 25X9MM (BLADE) ×1
BLADE OSC/SAG .038X5.5 CUT EDG (BLADE) ×3 IMPLANT
BLADE SURG 10 STRL SS (BLADE) ×3 IMPLANT
BLADE SURG 15 STRL LF DISP TIS (BLADE) ×2 IMPLANT
BLADE SURG 15 STRL SS (BLADE) ×4
BNDG COHESIVE 4X5 TAN STRL (GAUZE/BANDAGES/DRESSINGS) ×3 IMPLANT
BNDG CONFORM 2 STRL LF (GAUZE/BANDAGES/DRESSINGS) IMPLANT
BNDG CONFORM 3 STRL LF (GAUZE/BANDAGES/DRESSINGS) ×3 IMPLANT
BNDG ESMARK 4X9 LF (GAUZE/BANDAGES/DRESSINGS) IMPLANT
BNDG ESMARK 6X9 LF (GAUZE/BANDAGES/DRESSINGS) ×3
CAP PIN PROTECTOR ORTHO WHT (CAP) IMPLANT
CHLORAPREP W/TINT 26ML (MISCELLANEOUS) ×3 IMPLANT
COVER BACK TABLE 60X90IN (DRAPES) ×3 IMPLANT
CUFF TOURNIQUET SINGLE 24IN (TOURNIQUET CUFF) ×3 IMPLANT
CUFF TOURNIQUET SINGLE 34IN LL (TOURNIQUET CUFF) IMPLANT
DRAPE EXTREMITY T 121X128X90 (DRAPE) ×3 IMPLANT
DRAPE OEC MINIVIEW 54X84 (DRAPES) ×3 IMPLANT
DRAPE U-SHAPE 47X51 STRL (DRAPES) ×3 IMPLANT
DRSG MEPITEL 4X7.2 (GAUZE/BANDAGES/DRESSINGS) ×3 IMPLANT
DRSG PAD ABDOMINAL 8X10 ST (GAUZE/BANDAGES/DRESSINGS) ×3 IMPLANT
ELECT REM PT RETURN 9FT ADLT (ELECTROSURGICAL) ×3
ELECTRODE REM PT RTRN 9FT ADLT (ELECTROSURGICAL) ×1 IMPLANT
GAUZE SPONGE 4X4 12PLY STRL (GAUZE/BANDAGES/DRESSINGS) ×3 IMPLANT
GLOVE BIO SURGEON STRL SZ8 (GLOVE) ×3 IMPLANT
GLOVE BIOGEL PI IND STRL 7.0 (GLOVE) ×2 IMPLANT
GLOVE BIOGEL PI IND STRL 8 (GLOVE) ×2 IMPLANT
GLOVE BIOGEL PI INDICATOR 7.0 (GLOVE) ×4
GLOVE BIOGEL PI INDICATOR 8 (GLOVE) ×4
GLOVE ECLIPSE 6.5 STRL STRAW (GLOVE) ×3 IMPLANT
GLOVE ECLIPSE 7.5 STRL STRAW (GLOVE) ×3 IMPLANT
GLOVE EXAM NITRILE MD LF STRL (GLOVE) IMPLANT
GOWN STRL REUS W/ TWL LRG LVL3 (GOWN DISPOSABLE) ×1 IMPLANT
GOWN STRL REUS W/ TWL XL LVL3 (GOWN DISPOSABLE) ×2 IMPLANT
GOWN STRL REUS W/TWL LRG LVL3 (GOWN DISPOSABLE) ×2
GOWN STRL REUS W/TWL XL LVL3 (GOWN DISPOSABLE) ×4
K-WIRE .054X4 (WIRE) IMPLANT
K-WIRE COCR 1.1X105 (WIRE) ×3
KWIRE COCR 1.1X105 (WIRE) ×1 IMPLANT
NEEDLE HYPO 22GX1.5 SAFETY (NEEDLE) IMPLANT
NS IRRIG 1000ML POUR BTL (IV SOLUTION) ×3 IMPLANT
PACK BASIN DAY SURGERY FS (CUSTOM PROCEDURE TRAY) ×3 IMPLANT
PAD CAST 4YDX4 CTTN HI CHSV (CAST SUPPLIES) ×1 IMPLANT
PADDING CAST ABS 4INX4YD NS (CAST SUPPLIES)
PADDING CAST ABS COTTON 4X4 ST (CAST SUPPLIES) IMPLANT
PADDING CAST COTTON 4X4 STRL (CAST SUPPLIES) ×2
PASSER SUT SWANSON 36MM LOOP (INSTRUMENTS) IMPLANT
PENCIL BUTTON HOLSTER BLD 10FT (ELECTRODE) ×3 IMPLANT
SANITIZER HAND PURELL 535ML FO (MISCELLANEOUS) ×3 IMPLANT
SCREW HCS TWIST-OFF 2.0X14MM (Screw) ×3 IMPLANT
SCREW VPC 3.4X30MM (Screw) ×1 IMPLANT
SHEET MEDIUM DRAPE 40X70 STRL (DRAPES) ×3 IMPLANT
SLEEVE SCD COMPRESS KNEE MED (MISCELLANEOUS) ×3 IMPLANT
SPONGE LAP 18X18 X RAY DECT (DISPOSABLE) ×3 IMPLANT
STOCKINETTE 6  STRL (DRAPES) ×2
STOCKINETTE 6 STRL (DRAPES) ×1 IMPLANT
SUCTION FRAZIER HANDLE 10FR (MISCELLANEOUS) ×2
SUCTION TUBE FRAZIER 10FR DISP (MISCELLANEOUS) ×1 IMPLANT
SUT ETHILON 3 0 PS 1 (SUTURE) ×6 IMPLANT
SUT MNCRL AB 3-0 PS2 18 (SUTURE) ×3 IMPLANT
SUT VIC AB 2-0 SH 27 (SUTURE) ×2
SUT VIC AB 2-0 SH 27XBRD (SUTURE) ×1 IMPLANT
SUT VICRYL 0 UR6 27IN ABS (SUTURE) ×3 IMPLANT
SYR BULB 3OZ (MISCELLANEOUS) ×3 IMPLANT
SYR CONTROL 10ML LL (SYRINGE) ×3 IMPLANT
TOWEL OR 17X24 6PK STRL BLUE (TOWEL DISPOSABLE) ×3 IMPLANT
TUBE CONNECTING 20'X1/4 (TUBING) ×1
TUBE CONNECTING 20X1/4 (TUBING) ×2 IMPLANT
UNDERPAD 30X30 (UNDERPADS AND DIAPERS) ×3 IMPLANT
VPC SCREW 3.4X30MM (Screw) ×3 IMPLANT

## 2015-05-25 NOTE — Op Note (Signed)
[name], [name]               ACCOUNT NO.:  [id number]  MEDICAL RECORD NO.:  [id number]  LOCATION:                                 FACILITY:  PHYSICIAN:  Terry Arthurs, MD        DATE OF BIRTH:  May 04, 1955  DATE OF PROCEDURE:  05/25/2015 DATE OF DISCHARGE:                              OPERATIVE REPORT   PREOPERATIVE DIAGNOSIS:  Right second hammertoe deformity and metatarsalgia.  POSTOPERATIVE DIAGNOSIS:  Right second hammertoe deformity and metatarsalgia.  PROCEDURE: 1. Right second metatarsal Weil osteotomy. 2. Right second MTP joint dorsal capsulotomy and extensor tendon     lengthening. 3. Right second MTP joint lateral collateral ligament repair and     medial collateral ligament release. 4. Right second hammertoe correction through a separate incision. 5. Right second toe open flexor tendon release through a separate     incision. 6. Right foot AP and lateral radiographs.  SURGEON:  [name], M.D.  ASSISTANT:  Terry Martinez, [name].  ANESTHESIA:  Regional, MAC.  ESTIMATED BLOOD LOSS:  Minimal.  TOURNIQUET TIME:  43 minutes at 200 mmHg.  COMPLICATIONS:  None apparent.  DISPOSITION:  Extubated, awake, and stable to recovery.  INDICATION FOR PROCEDURE:  The patient is a 60 year old male with past medical history significant for Parkinson disease.  He complains of a second hammertoe deformity that has become quite bothersome on his right foot.  He has failed nonoperative treatment to date including activity modifications and shoe wear modifications as well as splinting.  He presents now for operative treatment of this painful condition.  He understands the risks and benefits, the alternative treatment options, and elects surgical treatment.  He specifically understands risks of bleeding, infection, nerve damage, blood clots, need for additional surgery, continued pain, recurrence of his deformity, amputation and death.  PROCEDURE IN DETAIL:  After  preoperative consent was obtained and the correct operative site was identified, the patient was brought to the operating room and placed supine on the operating table.  Regional anesthesia had previously been administered.  Surgical time-out was taken.  The right lower extremity was prepped and draped in standard sterile fashion with tourniquet around the calf.  The extremity was exsanguinated and a calf tourniquet was inflated to 200 mmHg.  A longitudinal incision was made over the second MTP joint.  Sharp dissection was carried down through the skin and subcutaneous tissue. The extensor digitorum longus and brevis tendons were both lengthened in Z-fashion and the dorsal joint capsule was excised in its entirety.  The medial collateral ligament was still noted to be excessively tight and this was released.  The lateral collateral ligament was noted to be quite stretched out and redundant.  A second metatarsal Weil osteotomy was then made with the oscillating saw removing a small wedge of bone distally.  The metatarsal head was allowed to retract proximally several millimeters and the osteotomy was fixed with a 2-mm Biomet FRS screw. The overhanging bone was trimmed with a rongeur.  The lateral collateral ligament was then reefed with a box suture of 0 Vicryl.  This was tagged to be tied later.  Attention was then turned to the plantar  aspect of the forefoot where a blunt dissection was carried down the flexor tendon sheath and the sheath was incised.  The flexor digitorum longus tendon was isolated and released from the distal phalanx with a stab incision.  It was then split longitudinally and then tagged with 2-0 Vicryl sutures.  The 2 limbs of the flexor tendon were then passed subperiosteal along base of the proximal phalanx through dorsal incision.  They were tagged to be tied later.  A transverse incision was then made over the PIP joint. Sharp dissection was carried down through  the skin and subcutaneous tissue.  The extensor mechanism was incised.  The collateral ligaments were released.  The head of the proximal phalanx was resected with an oscillating saw followed by the base of the middle phalanx.  The joint was reduced and fixed with a 3.4-mm Biomet headless compression screw. AP and lateral radiographs confirmed appropriate reduction of the joint, appropriate position of the screw.  The plantar wound was then irrigated and closed with nylon.  The toe was then reduced and the flexor tendons repaired dorsally with figure-of-eight sutures of 2-0 Vicryl repairing it to the extensor mechanism.  The lateral collateral ligament was then repaired by tying the previously placed suture.  AP and lateral radiographs confirmed appropriate position and length of all hardware and appropriate reduction of the second toe.  Wound was irrigated copiously and closed with Monocryl and nylon.  Sterile dressings were applied followed by a compression wrap.  The tourniquet was released after application of the dressings at 43 minutes.  The patient was awakened from anesthesia and transported to the recovery room in stable condition.  FOLLOWUP PLAN:  The patient will be weightbearing as tolerated on his right foot in a flat postop shoe.  He will follow up with me in the office in 2 weeks for suture removal.  RADIOGRAPHS:  AP and lateral radiographs of the right foot were obtained intraoperatively.  These show interval reduction and fixation of the second hammertoe deformity.  Hardware is appropriately positioned and of the appropriate length.  No other acute injuries are noted.  Terry MartinezJustin Ollis, [name] was present and scrubbed for the duration of the case.  His assistance was essential in positioning the patient, prepping and draping, gaining and maintaining exposure, performing the operation, and closing and dressing the wounds.     [name] , MD     JH/MEDQ  D:   05/25/2015  T:  05/25/2015  Job:  [id number]

## 2015-05-25 NOTE — Transfer of Care (Signed)
Immediate Anesthesia Transfer of Care Note  Patient: Phil DoppJerry R Osier  Procedure(s) Performed: Procedure(s): RIGHT SECOND TOE METATARSAL WEIL OSTEOTOMY  HAMMERTOE CORRECTION COLLATERAL LIGAMENT REPAIR; FLEXOR TO EXTENSOR TRANSFER  (Right)  Patient Location: PACU  Anesthesia Type:MAC combined with regional for post-op pain  Level of Consciousness: awake, alert , oriented and patient cooperative  Airway & Oxygen Therapy: Patient Spontanous Breathing and Patient connected to face mask oxygen  Post-op Assessment: Report given to RN and Post -op Vital signs reviewed and stable  Post vital signs: Reviewed and stable  Last Vitals:  Filed Vitals:   05/25/15 0700 05/25/15 0705  BP:  120/75  Pulse: 72 69  Temp:    Resp: 10 13    Complications: No apparent anesthesia complications

## 2015-05-25 NOTE — Brief Op Note (Signed)
05/25/2015  8:42 AM  PATIENT:  Georgann HousekeeperJerry R Loudermilk  60 y.o. male  PRE-OPERATIVE DIAGNOSIS:  RIGHT SECOND HAMMERTOE AND METATARSALGIA  POST-OPERATIVE DIAGNOSIS:  RIGHT SECOND HAMMERTOE AND METATARSALGIA  Procedure(s): 1.  Right 2nd MT Weil osteotomy 2.  Right 2nd MTP joint dorsal capsulotomy and extensor tendon lengthening 3.  Right 2nd MTP joint collateral ligament repair 4.  Right 2nd hammertoe correction (separate incision) 5.  Right 2nd toe open flexor tendon release (separate incision) 6.  Right foot AP and lateral xrays   SURGEON:  Toni ArthursJohn , MD  ASSISTANT:  Alfredo MartinezJustin Ollis, PA-C  ANESTHESIA:   MAC, regional  EBL:  minimal   TOURNIQUET:   Total Tourniquet Time Documented: Calf (Right) - 43 minutes Total: Calf (Right) - 43 minutes  COMPLICATIONS:  None apparent  DISPOSITION:  Extubated, awake and stable to recovery.  DICTATION ID:  [id number]

## 2015-05-25 NOTE — H&P (Signed)
Phil DoppJerry R Belland is an 60 y.o. male.   Chief Complaint: right forefoot pain HPI: 60 y/o male with PMH of parkinson's disease c/o pain at the right forefoot related to a 2nd hammertoe deformity.  He has failed non op treatment and presents today for right forefoot reconstruction.  Past Medical History  Diagnosis Date  . Calculus of kidney   . Sleep disturbance, unspecified   . Lesion of ulnar nerve   . Paralysis agitans (HCC)   . Parkinson's disease [facility](HCC)     Past Surgical History  Procedure Laterality Date  . Kidney stones  1991    Family History  Problem Relation Age of Onset  . Heart failure Father   . Cancer Father   . Diabetes Father   . Sleep apnea Mother   . Sleep apnea Brother    Social History:  reports that he has never smoked. He has never used smokeless tobacco. He reports that he drinks about 1.8 oz of alcohol per week. He reports that he does not use illicit drugs.  Allergies: No Known Allergies  Medications Prior to Admission  Medication Sig Dispense Refill  . rasagiline (AZILECT) 1 MG TABS tablet Take 1 tablet (1 mg total) by mouth daily. 90 tablet 3  . Rotigotine (NEUPRO) 8 MG/24HR PT24 Place 8 mg onto the skin daily. 90 patch 3    No results found for this or any previous visit (from the past 48 hour(s)). No results found.  ROS  No recent f/c/n/v/wt loss  Blood pressure 120/75, pulse 69, temperature 98.4 F (36.9 C), resp. rate 13, height 6\' 2"  (1.88 m), weight 115.214 kg (254 lb), SpO2 100 %. Physical Exam  wn wd male in nad.  A and ox 4.  Mood and affect normal.  EOMI.  resp unlabored.  R forefoot with healthy skin.  No lymphadenopathy.  5/5 strength in PF and DF of the toes.  2+ dp pulse.  Sens to LT intact at thef orefoot.  2nd hammertoe deformity is not passively correctable.  Assessment/Plan R 2nd hammertoe deformity with metatarsalgia - to OR for 2nd MT weil osteotomy, hammertoe correction, collateral ligament repair and flexor to extensor  transfer.  The risks and benefits of the alternative treatment options have been discussed in detail.  The patient wishes to proceed with surgery and specifically understands risks of bleeding, infection, nerve damage, blood clots, need for additional surgery, amputation and death.   Toni ArthursHEWITT, , MD 05/25/2015, 7:26 AM

## 2015-05-25 NOTE — Progress Notes (Signed)
Assisted Dr. Turk with right, ultrasound guided, popliteal block. Side rails up, monitors on throughout procedure. See vital signs in flow sheet. Tolerated Procedure well. 

## 2015-05-25 NOTE — Anesthesia Procedure Notes (Addendum)
Anesthesia Regional Block:  Popliteal block  Pre-Anesthetic Checklist: ,, timeout performed, Correct Patient, Correct Site, Correct Laterality, Correct Procedure, Correct Position, site marked, Risks and benefits discussed,  Surgical consent,  Pre-op evaluation,  At surgeon's request and post-op pain management  Laterality: Right  Prep: chloraprep       Needles:  Injection technique: Single-shot  Needle Type: Echogenic Needle     Needle Length: 9cm 9 cm Needle Gauge: 21 and 21 G    Additional Needles:  Procedures: ultrasound guided (picture in chart) Popliteal block Narrative:  Injection made incrementally with aspirations every 5 mL.  Performed by: Personally  Anesthesiologist: Cecile HearingURK, STEPHEN EDWARD  Additional Notes: No pain on injection. No increased resistance to injection. Injection made in 5cc increments.  Good needle visualization.  Patient tolerated procedure well.   Procedure Name: MAC Date/Time: 05/25/2015 7:34 AM Performed by: ,  D Pre-anesthesia Checklist: Patient identified, Emergency Drugs available, Suction available, Patient being monitored and Timeout performed Patient Re-evaluated:Patient Re-evaluated prior to inductionOxygen Delivery Method: Simple face mask

## 2015-05-25 NOTE — Discharge Instructions (Addendum)
Toni ArthursJohn Hewitt, MD [facility]  Please read the following information regarding your care after surgery.  Medications  You only need a prescription for the narcotic pain medicine (ex. oxycodone, Percocet, Norco).  All of the other medicines listed below are available over the counter. X acetominophen (Tylenol) 650 mg every 4-6 hours as you need for minor pain X oxycodone as prescribed for moderate to severe pain ?   Narcotic pain medicine (ex. oxycodone, Percocet, Vicodin) will cause constipation.  To prevent this problem, take the following medicines while you are taking any pain medicine. X docusate sodium (Colace) 100 mg twice a day X senna (Senokot) 2 tablets twice a day   Weight Bearing X Bear weight when you are able on your operated leg or foot in flat post-op shoe.  Dressing X Keep your dressing clean and dry.  Dont put anything (coat hanger, pencil, etc) down inside of it.  If it gets damp, use a hair dryer on the cool setting to dry it.  If it gets soaked, call the office to schedule an appointment for a cast change.   After your dressing, cast or splint is removed; you may shower, but do not soak or scrub the wound.  Allow the water to run over it, and then gently pat it dry.  Swelling It is normal for you to have swelling where you had surgery.  To reduce swelling and pain, keep your toes above your nose for at least 3 days after surgery.  It may be necessary to keep your foot or leg elevated for several weeks.  If it hurts, it should be elevated.  Follow Up Call my office at [phone] when you are discharged from the hospital or surgery center to schedule an appointment to be seen two weeks after surgery.  Call my office at [phone] if you develop a fever >101.5 F, nausea, vomiting, bleeding from the surgical site or severe pain.    Regional Anesthesia Blocks  1. Numbness or the inability to move the "blocked" extremity may last from 3-48 hours after  placement. The length of time depends on the medication injected and your individual response to the medication. If the numbness is not going away after 48 hours, call your surgeon.  2. The extremity that is blocked will need to be protected until the numbness is gone and the  Strength has returned. Because you cannot feel it, you will need to take extra care to avoid injury. Because it may be weak, you may have difficulty moving it or using it. You may not know what position it is in without looking at it while the block is in effect.  3. For blocks in the legs and feet, returning to weight bearing and walking needs to be done carefully. You will need to wait until the numbness is entirely gone and the strength has returned. You should be able to move your leg and foot normally before you try and bear weight or walk. You will need someone to be with you when you first try to ensure you do not fall and possibly risk injury.  4. Bruising and tenderness at the needle site are common side effects and will resolve in a few days.  5. Persistent numbness or new problems with movement should be communicated to the surgeon or the [facility] [phone])/ [facility] [phone]).  Post Anesthesia Home Care Instructions  Activity: Get plenty of rest for the remainder of the day. A  responsible adult should stay with you for 24 hours following the procedure.  For the next 24 hours, DO NOT: -Drive a car -Advertising copywriterperate machinery -Drink alcoholic beverages -Take any medication unless instructed by your physician -Make any legal decisions or sign important papers.  Meals: Start with liquid foods such as gelatin or soup. Progress to regular foods as tolerated. Avoid greasy, spicy, heavy foods. If nausea and/or vomiting occur, drink only clear liquids until the nausea and/or vomiting subsides. Call your physician if vomiting continues.  Special Instructions/Symptoms: Your throat may  feel dry or sore from the anesthesia or the breathing tube placed in your throat during surgery. If this causes discomfort, gargle with warm salt water. The discomfort should disappear within 24 hours.  If you had a scopolamine patch placed behind your ear for the management of post- operative nausea and/or vomiting:  1. The medication in the patch is effective for 72 hours, after which it should be removed.  Wrap patch in a tissue and discard in the trash. Wash hands thoroughly with soap and water. 2. You may remove the patch earlier than 72 hours if you experience unpleasant side effects which may include dry mouth, dizziness or visual disturbances. 3. Avoid touching the patch. Wash your hands with soap and water after contact with the patch.

## 2015-05-25 NOTE — Anesthesia Postprocedure Evaluation (Signed)
Anesthesia Post Note  Patient: Nilton R SpeighPhil Doppt  Procedure(s) Performed: Procedure(s) (LRB): RIGHT SECOND TOE METATARSAL WEIL OSTEOTOMY  HAMMERTOE CORRECTION COLLATERAL LIGAMENT REPAIR; FLEXOR TO EXTENSOR TRANSFER  (Right)  Patient location during evaluation: PACU Anesthesia Type: MAC and Regional Level of consciousness: awake and alert, awake, patient cooperative and oriented Pain management: pain level controlled Vital Signs Assessment: post-procedure vital signs reviewed and stable Respiratory status: spontaneous breathing, nonlabored ventilation, respiratory function stable and patient connected to nasal cannula oxygen Cardiovascular status: stable and blood pressure returned to baseline Anesthetic complications: no Comments: MAC plus popliteal nerve block    Last Vitals:  Filed Vitals:   05/25/15 0915 05/25/15 1008  BP: 116/80 120/78  Pulse: 79 74  Temp:  36.5 C  Resp: 12 20    Last Pain:  Filed Vitals:   05/25/15 1009  PainSc: 0-No pain                 Cecile HearingStephen Edward Turk

## 2015-05-29 ENCOUNTER — Encounter (HOSPITAL_BASED_OUTPATIENT_CLINIC_OR_DEPARTMENT_OTHER): Payer: Self-pay | Admitting: Orthopedic Surgery

## 2015-05-31 ENCOUNTER — Ambulatory Visit: Payer: Managed Care, Other (non HMO) | Admitting: Neurology

## 2015-06-20 ENCOUNTER — Ambulatory Visit: Payer: Managed Care, Other (non HMO) | Admitting: Neurology

## 2015-06-20 ENCOUNTER — Telehealth: Payer: Self-pay

## 2015-06-20 NOTE — Telephone Encounter (Signed)
Patient cancelled appt same day  

## 2015-06-21 ENCOUNTER — Encounter: Payer: Self-pay | Admitting: Neurology

## 2015-07-05 ENCOUNTER — Ambulatory Visit: Payer: Managed Care, Other (non HMO) | Admitting: Neurology

## 2015-08-01 ENCOUNTER — Ambulatory Visit (INDEPENDENT_AMBULATORY_CARE_PROVIDER_SITE_OTHER): Payer: Managed Care, Other (non HMO) | Admitting: Neurology

## 2015-08-01 ENCOUNTER — Encounter: Payer: Self-pay | Admitting: Neurology

## 2015-08-01 VITALS — BP 114/78 | HR 78 | Resp 18 | Ht 74.0 in | Wt 256.0 lb

## 2015-08-01 DIAGNOSIS — G20 Parkinson's disease: Secondary | ICD-10-CM

## 2015-08-01 DIAGNOSIS — R600 Localized edema: Secondary | ICD-10-CM

## 2015-08-01 MED ORDER — CARBIDOPA-LEVODOPA ER 23.75-95 MG PO CPCR: 285.0000 mg | ORAL_CAPSULE | Freq: Three times a day (TID) | ORAL | Status: DC

## 2015-08-01 MED ORDER — RASAGILINE MESYLATE 1 MG PO TABS: 1.0000 mg | ORAL_TABLET | Freq: Every day | ORAL | Status: DC

## 2015-08-01 MED ORDER — ROTIGOTINE 8 MG/24HR TD PT24: 8.0000 mg | MEDICATED_PATCH | Freq: Every day | TRANSDERMAL | Status: DC

## 2015-08-01 NOTE — Progress Notes (Signed)
Subjective:    Patient ID: [name] is a 60 y.o. male.  HPI     Interim history:   Mr. [name] is a 60 year old right-handed gentleman with an underlying history of hyperlipidemia and kidney stones, who presents for followup consultation of his right-sided predominant Parkinson's disease. He is accompanied by his wife today. Of note, he missed an appointment on 06/20/2015 due to being sick. I last saw him on 12/01/2014, at which time he reported a change in his insurance, mood and memory were stable, lower extremity swelling was stable, we kept his Neupro patch at 8 mg an Azilect 1 mg once daily the same. He was working full-time, also active with his 2 teenage boys, 58 year old an 74 year old. His wife had a recent change in her job.   Today, 07/31/2015: He reports feeling worse. He feels that it is difficult to exercise. He has gained weight. Since his foot surgery his balance is worse as well. In the interim, he had surgery on his right foot on 05/25/2015 for hammertoes. His weight has gradually increased, not so much after his foot surgery, thankfully. He has occasional depressed mood but not sustained depression. His tremor is worse. Lower extremity swelling has been stable. Memory is good.  Previously:  I saw him on 08/02/2014, at which time he reported doing well. He had some leg swelling. His right hand swelling was a little worse. Cognitively and mood wise he was stable. Balance may have been off at times but generally speaking he was doing well.   I saw him on 04/05/2014, at which time he reported feeling fairly stable but his wife felt his balance was not as good. He was working out with a Clinical research associate. He had picked up boxing. He was working full-time. He cut back on his sodas. I kept him on his medications, Neupro patch 6 mg and Azilect 1 mg. He had some ongoing issues with lower extremity swelling and I referred him to vascular surgery for consultation. He was seen by a vascular  specialist, Dr. Scot Dock on 04/13/2014. He had Doppler studies to his lower extremities and was advised he had no DVT and good arterial flow. His swelling improved.   I saw him on 12/03/2013, at which time he reported doing well and had noted a benefit from Neupro patch 6 mg. I continued him on this dose as well as Azilect 1 mg once daily. His exam was stable.  I saw him on 07/28/2013, at which time he reported some worsening of his tremor and his gait. He was able to tolerate neupro patch 4 mg strength. He has had some skin irritation, most likely from the adhesive. He was not exercising regularly but endorsed being active and working full-time. His memory was stable. He denies any impulse control disorder but had mild daytime somnolence which was not as severe as when he was on Requip long-acting.    I saw him on 02/05/2013, at which time I felt his physical exam was a little worse since stopping Requip XL. He had stopped this because of daytime somnolence and severe nausea. I suggested we start him on Neupro patch. I provided him with samples. We talked about doing a sleep study down the [place].    I first met him on 07/15/2012, at which time I suggested he start taking coenzyme Q 10. I also suggested that he switch his Requip XL 4 mg to nighttime because of report of daytime somnolence. I did not increase make any  other changes to his medications and felt that he was overall stable.    He previously followed with Dr. Jeneen Rinks love and was last seen by him on 03/17/2012 at which time Dr. Erling Cruz increase his Requip XL and continued him on Azilect. He was diagnosed in 12/2008, and Sx go back to a year prior to that.   MRI brain without contrast was done in the past. He has been on rasagiline, which improved his tremor. 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 There is no family history of tremor. There is no history of REM behavior disorder. He has had no falls, or  hallucinations or involuntary movements otherwise. He exercises not very regularly. His memory and mood have been stable. He has no problems with lower extremities swelling or compulsive thoughts or gambling. He works full-time as a Engineer, site.    His Past Medical History Is Significant For: Past Medical History  Diagnosis Date  . Calculus of kidney   . Sleep disturbance, unspecified   . Lesion of ulnar nerve   . Paralysis agitans ([place])   . Parkinson's disease ([place])     His Past Surgical History Is Significant For: Past Surgical History  Procedure Laterality Date  . Kidney stones  1991  . Hammertoe reconstruction with weil osteotomy Right 05/25/2015    Procedure: RIGHT SECOND TOE METATARSAL WEIL OSTEOTOMY  HAMMERTOE CORRECTION COLLATERAL LIGAMENT REPAIR; FLEXOR TO EXTENSOR TRANSFER ;  Surgeon: Wylene Simmer, MD;  Location: [place];  Service: Orthopedics;  Laterality: Right;    His Family History Is Significant For: Family History  Problem Relation Age of Onset  . Heart failure Father   . Cancer Father   . Diabetes Father   . Sleep apnea Mother   . Sleep apnea Brother     His Social History Is Significant For: Social History   Social History  . Marital Status: Married    Spouse Name: N/A  . Number of Children: N/A  . Years of Education: N/A   Social History Main Topics  . Smoking status: Never Smoker   . Smokeless tobacco: Never Used  . Alcohol Use: 1.8 oz/week    3 Standard drinks or equivalent per week     Comment: social  . Drug Use: No  . Sexual Activity: Not Asked   Other Topics Concern  . None   Social History Narrative   Consumes 1 soda a day     His Allergies Are:  No Known Allergies:   His Current Medications Are:  Outpatient Encounter Prescriptions as of 08/01/2015  Medication Sig  . rasagiline (AZILECT) 1 MG TABS tablet Take 1 tablet (1 mg total) by mouth daily.  . Rotigotine (NEUPRO) 8 MG/24HR PT24 Place 8 mg onto the skin  daily.  . [DISCONTINUED] docusate sodium (COLACE) 100 MG capsule Take 1 capsule (100 mg total) by mouth 2 (two) times daily. While taking narcotic pain medicine.  . [DISCONTINUED] oxyCODONE (ROXICODONE) 5 MG immediate release tablet Take 1-2 tablets (5-10 mg total) by mouth every 4 (four) hours as needed for moderate pain or severe pain.  . [DISCONTINUED] senna (SENOKOT) 8.6 MG TABS tablet Take 2 tablets (17.2 mg total) by mouth 2 (two) times daily.   No facility-administered encounter medications on file as of 08/01/2015.  :  Review of Systems:  Out of a complete 14 point review of systems, all are reviewed and negative with the exception of these symptoms  as listed below:   Review of Systems  Neurological:       Patient states that it has been a rough 8 months. Had foot surgery, feels like he is more off balance, and having more fatigue. He reports that he is unable to exercise.     Objective:  Neurologic Exam  Physical Exam Physical Examination:   Filed Vitals:   08/01/15 0843  BP: 114/78  Pulse: 78  Resp: 18   General Examination: The patient is a very pleasant 60 y.o. male in no acute distress. He is in good spirits today.   HEENT: Normocephalic, atraumatic, pupils are equal, round and reactive to light and accommodation. Extraocular tracking shows mild saccadic breakdown without nystagmus noted. There is no limitation to his gaze. There is mild decrease in eye blink rate. Hearing is intact. Face is symmetric with mild facial masking and normal facial sensation. There is no lip, neck or jaw tremor. Neck is moderately rigid with intact passive ROM. There are no carotid bruits on auscultation. Oropharynx exam reveals mild mouth dryness. He has a mild to moderately crowded airway in that he has a floppy appearing soft palate and elongated uvula. Mallampati is class II. Tongue protrudes centrally and palate elevates symmetrically. There is no drooling.   Chest: is clear to  auscultation without wheezing, rhonchi or crackles noted.  Heart: sounds are regular and normal without murmurs, rubs or gallops noted.   Abdomen: is soft, non-tender and non-distended with normal bowel sounds appreciated on auscultation.  Extremities: There mild pitting edema in the distal lower extremities bilaterally, R more than L, stable from last time, mild hyperpigmentation noted.   Skin: is warm and dry with no trophic changes noted.  Musculoskeletal: exam reveals no obvious joint deformities, tenderness, joint swelling or erythema with the exception of right foot swelling, status post right second toe hammertoe repair, callus noted on the right big toe.  Neurologically:  Mental status: The patient is awake and alert, paying good  attention. He is able to completely provide the history and his wife provides details or additional information. He is oriented to: person, place, time/date, situation, day of week, month of year and year. His memory, attention, language and knowledge are intact. There is no aphasia, agnosia, apraxia or anomia. There is a no bradyphrenia. Speech is mildly hypophonic with no dysarthria noted. Mood is congruent and affect is normal.   Cranial nerves are as described above under HEENT exam. In addition, shoulder shrug is normal, but right shoulder is a little higher than left shoulder.  Motor exam: Normal bulk, and strength for age is noted. There are no dyskinesias noted.  Tone is mildly rigid with presence of cogwheeling in the right upper extremity. There is overall moderate bradykinesia. There is no drift or rebound.  There is a moderate resting tremor in the right upper extremity. The tremor is fairly constant. There is no other tremor today.    Romberg is negative.   Reflexes are 1+ in the upper extremities and 1+ in the lower extremities.    Fine motor skills exam: Finger taps are moderately impaired on the right and mild to moderately impaired on the  left. Hand movements are moderately impaired on the right and mildly impaired on the left. RAP (rapid alternating patting) is mildly impaired on the right and minimally impaired on the left. Foot taps are moderately impaired on the right and mildly impaired on the left. Foot agility (in the form of heel stomping)  is moderately impaired on the right and minimally impaired on the left.    Cerebellar testing shows no dysmetria or intention tremor on finger to nose testing. Heel to shin is unremarkable bilaterally. There is no truncal or gait ataxia.   Sensory exam is intact to light touch in both upper and lower extremities.  Gait, station and balance: He stands up from the seated position with no significant difficulty and does not need to push up with His hands. He needs no assistance. No veering to one side is noted. He is noted to lean slightly to the R side. Posture is mild to moderately stooped for age. Stance is slightly wide-based. He walks with reduced stride length reduced pace and decreased arm swing on the right. He turns in en bloc. Balance is Preserved.   Assessment and Plan:   In summary, NHIA HEAPHY is a very pleasant 60 year old male with a history of Parkinson's disease, right-sided predominant, diagnosed in 2010 with symptoms going back to 2009. His physical exam has been fairly stable For quite some time, but in the last 6-8 months he has had progression. He does does have persistent lower extremity swelling which has been stable from last time. I have asked him to monitor this. He has had a Doppler ultrasound to his lower extremities and met with the vascular surgeon. The swelling could be secondary to the dopamine agonist, which is the Neupro patch, I explained to them. We need to monitor this ongoing. As far as his motor exam, he has had been fairly stable  for years, but his exam shows progression. We had increased his Neupro in June 2016. We will continue with this. Azilect has  been 1 mg once daily. Neupro patch was started in December 2014 by me. He had SE on Requip XL. he was not able to exercise in the past 6 months or so and has just recently resumed working with his trainer. He is advised that he should continue to work on his right foot. He does not seem to roll and very well and also has a tendency to pointed toes inwards. This can be a risk for tripping himself. Today, we discussed his medication regimen and mutually agreed to add levodopa to his regimen. To that end, he will continue with Neupro patch once daily and Azilect once daily, we will start him on a trial of Rytary 95 mg strength, 1 pill 3 times a day with weekly increased up to 3 pills 3 times a day for now. He is advised to email me with an update in about a month or call for an update. I provided refills on the Azilect and Neupro and a new prescription for Rytary.  I asked him to continue to stay active. He is encouraged to drink more water. I would like to see him back in 4 months, sooner if needed. I answered all their questions today and the patient and his wife were in agreement. we also talked about potentially pursuing DBS surgery in the future. He would from my end of things be a good surgical candidate. Nevertheless, we mutually agreed to try him on levodopa at this time and see how he fares.  I spent 40 minutes in total face-to-face time with the patient, more than 50% of which was spent in counseling and coordination of care, reviewing test results, reviewing medication and discussing or reviewing the diagnosis of PD, its prognosis and treatment options.

## 2015-08-01 NOTE — Patient Instructions (Addendum)
  1. We will continue Neupro 8 mg once daily.   2. We will continue Azilect 1 mg once daily.   3. We will start you on Levodopa therapy: Rytary 95 mg strength: take 1 pill three times a day, try to take on empty stomach for better absorption. After 1 week, take 2 pills 3 times a day, after another week, take 3 pills 3 times a day.   4. Call in about 1 month or email for an update as to how Rytary is working for you. At that time we can fine tune your dose.   5. Walk 30 minutes daily or use the bike.   6. Follow up in 3 months.

## 2015-08-09 ENCOUNTER — Encounter: Payer: Self-pay | Admitting: Neurology

## 2015-08-17 ENCOUNTER — Encounter: Payer: Self-pay | Admitting: Neurology

## 2015-08-21 ENCOUNTER — Telehealth: Payer: Self-pay

## 2015-08-21 DIAGNOSIS — G20 Parkinson's disease: Secondary | ICD-10-CM

## 2015-08-21 MED ORDER — CARBIDOPA-LEVODOPA 25-100 MG PO TABS: ORAL_TABLET | ORAL | Status: DC

## 2015-08-21 NOTE — Telephone Encounter (Signed)
I spoke to patient and he is aware of information below. He will call back with any further questions or concerns.

## 2015-08-21 NOTE — Telephone Encounter (Signed)
Please call patient to initiate C/L: we will start low dose Sinemet (generic name: carbidopa-levodopa) 25/100 mg: Take half a pill twice daily (8 AM and noon) for one week, then half a pill 3 times a day (8 AM, noon, and 4 PM) for one week, then one pill 3 times a day thereafter. Please try to take the medication away from mealtimes, that is, ideally either one hour before or 2 hours after your meal to ensure optimal absorption. The medication can interfere with the protein content of your meal and trying to the protein in your food and therefore not get fully absorbed.  Common side effects reported are: Nausea, vomiting, sedation, confusion, lightheadedness. Rare side effects include hallucinations, severe nausea or vomiting, diarrhea and significant drop in blood pressure especially when going from lying to standing or from sitting to standing.

## 2015-08-21 NOTE — Telephone Encounter (Signed)
I spoke to [name]. He did not try the copay assistance program. He says that when they did it with Azilect, it was too much work. He would rather take Sinemet a couple of times a day since it is cheaper. [facility] [address]

## 2015-08-21 NOTE — Addendum Note (Signed)
Addended by: Huston FoleyATHAR,  on: 08/21/2015 01:06 PM   Modules accepted: Orders

## 2015-10-24 ENCOUNTER — Telehealth: Payer: Self-pay | Admitting: Neurology

## 2015-10-24 NOTE — Telephone Encounter (Signed)
Wife called to inquire about handicapped placard, states husband has Parkinson's and had foot surgery that Dr. Frances FurbishAthar knows about, "he's struggling to walk", Terry MessierKathy advised, handicapped placard form can be obtained from [facility], can drop off form at our office and once form is complete, someone will call to let her know it's ready.

## 2015-10-31 ENCOUNTER — Telehealth: Payer: Self-pay

## 2015-10-31 DIAGNOSIS — G20 Parkinson's disease: Secondary | ICD-10-CM

## 2015-10-31 MED ORDER — CARBIDOPA-LEVODOPA 25-100 MG PO TABS: 1.0000 | ORAL_TABLET | Freq: Three times a day (TID) | ORAL | 3 refills | Status: DC

## 2015-10-31 NOTE — Telephone Encounter (Signed)
90 day supply request

## 2015-10-31 NOTE — Telephone Encounter (Signed)
LM for them to call back if they still need the DMV form. I have not seen it yet.

## 2015-11-06 ENCOUNTER — Telehealth: Payer: Self-pay

## 2015-11-06 DIAGNOSIS — G20 Parkinson's disease: Secondary | ICD-10-CM

## 2015-11-06 MED ORDER — RASAGILINE MESYLATE 1 MG PO TABS: 1.0000 mg | ORAL_TABLET | Freq: Every day | ORAL | 3 refills | Status: DC

## 2015-11-06 MED ORDER — CARBIDOPA-LEVODOPA 25-100 MG PO TABS: 1.0000 | ORAL_TABLET | Freq: Three times a day (TID) | ORAL | 3 refills | Status: DC

## 2015-11-06 NOTE — Telephone Encounter (Signed)
Mail order request

## 2015-11-08 ENCOUNTER — Telehealth: Payer: Self-pay

## 2015-11-08 DIAGNOSIS — G20 Parkinson's disease: Secondary | ICD-10-CM

## 2015-11-08 MED ORDER — ROTIGOTINE 8 MG/24HR TD PT24: 8.0000 mg | MEDICATED_PATCH | Freq: Every day | TRANSDERMAL | 3 refills | Status: DC

## 2015-11-08 NOTE — Telephone Encounter (Signed)
Mail order request received for Neupro patch

## 2015-11-09 NOTE — Telephone Encounter (Addendum)
Kerri/[facility] called to clarify that fax they received for carbidopa-levodopa (SINEMET IR) 25-100 MG tablet was indeed sent from our office. Please call to advise [phone]. Kerri requests that you reference number [id number] when calling.

## 2015-11-09 NOTE — Telephone Encounter (Signed)
Clarification form faxed in today. Delay in faxing due to provider being out and it required a signature.

## 2015-11-09 NOTE — Telephone Encounter (Signed)
error 

## 2015-11-20 ENCOUNTER — Telehealth: Payer: Self-pay

## 2015-11-20 MED ORDER — ROTIGOTINE 8 MG/24HR TD PT24: 8.0000 mg | MEDICATED_PATCH | Freq: Every day | TRANSDERMAL | 3 refills | Status: DC

## 2015-11-20 MED ORDER — ROTIGOTINE 8 MG/24HR TD PT24: 8.0000 mg | MEDICATED_PATCH | Freq: Every day | TRANSDERMAL | 0 refills | Status: DC

## 2015-11-20 NOTE — Addendum Note (Signed)
Addended by: Crisoforo OxfordURNER,  S on: 11/20/2015 12:03 PM   Modules accepted: Orders

## 2015-11-20 NOTE — Telephone Encounter (Signed)
I spoke to patient and he is aware that the [facility] form is at the front desk for pick up. I also included coupons for Neupro patch. I sent Neupro Rx to pharmacy 1 30 day Rx to use with 30 day free trial coupon. And 90 day Rx for future refills.

## 2015-11-20 NOTE — Telephone Encounter (Signed)
Opened in error

## 2015-11-20 NOTE — Telephone Encounter (Signed)
Pt called said the neupro is going to cost more money to have it refilled at mail order. He is [facility]  [facility] left msg on 9/19 but  has not heard back from clinic.

## 2015-12-04 ENCOUNTER — Encounter: Payer: Self-pay | Admitting: Neurology

## 2015-12-04 ENCOUNTER — Telehealth: Payer: Self-pay | Admitting: Neurology

## 2015-12-04 ENCOUNTER — Ambulatory Visit (INDEPENDENT_AMBULATORY_CARE_PROVIDER_SITE_OTHER): Payer: Managed Care, Other (non HMO) | Admitting: Neurology

## 2015-12-04 VITALS — BP 128/82 | HR 78 | Resp 16 | Ht 74.0 in | Wt 261.0 lb

## 2015-12-04 DIAGNOSIS — M79671 Pain in right foot: Secondary | ICD-10-CM

## 2015-12-04 DIAGNOSIS — G20 Parkinson's disease: Secondary | ICD-10-CM

## 2015-12-04 DIAGNOSIS — R600 Localized edema: Secondary | ICD-10-CM | POA: Diagnosis not present

## 2015-12-04 MED ORDER — GABAPENTIN 100 MG PO CAPS: ORAL_CAPSULE | ORAL | 5 refills | Status: DC

## 2015-12-04 MED ORDER — ROTIGOTINE 8 MG/24HR TD PT24: 8.0000 mg | MEDICATED_PATCH | Freq: Every day | TRANSDERMAL | 5 refills | Status: DC

## 2015-12-04 MED ORDER — RASAGILINE MESYLATE 1 MG PO TABS: 1.0000 mg | ORAL_TABLET | Freq: Every day | ORAL | 3 refills | Status: DC

## 2015-12-04 MED ORDER — CARBIDOPA-LEVODOPA 25-100 MG PO TABS: 1.0000 | ORAL_TABLET | Freq: Four times a day (QID) | ORAL | 3 refills | Status: DC

## 2015-12-04 NOTE — Patient Instructions (Addendum)
We will continue with your PD medications, with the addition of one more pill of sinemet: you will therefore take 1 pill 4 times a day, 8 AM, 12, 4 PM, and 8 PM.   As discussed, I will make a referral to [facility] deep brain stimulation (DBS) for treatment of PD. [facility]   You should hear back from the neurology department [facility] directly. If you don't hear back in the next couple of weeks from them, call us and we will inquire about the status of the referral. Please keep in mind, that it may take several weeks or even a few months to get in to see the neurologist.  For your right foot pain, let's try you on Start Neurontin (gabapentin) 100 mg strength: Take 1 pill nightly at bedtime for 1 week, then 2 pills nightly for 1 week, then 3 pills each night thereafter. The most common side effects reported are sedation or sleepiness. Rare side effects include balance problems, confusion.  Please ask your wife if you snore and if so, how loud it is, and if you have breathing related issues in your sleep, such as: snorting sounds, choking sounds, pauses in your breathing or shallow breathing events. These may be symptoms of obstructive sleep apnea (OSA).

## 2015-12-04 NOTE — Telephone Encounter (Signed)
I refaxed Rx with cover sheet

## 2015-12-04 NOTE — Progress Notes (Signed)
Subjective:    Patient ID: [name] is a 60 y.o. male.  HPI     Interim history:   [name] is a 60 year old right-handed gentleman with an underlying history of hyperlipidemia and kidney stones, who presents for followup consultation of his right-sided predominant Parkinson's disease. He is accompanied by his wife today. I last saw him on 08/01/2015, at which time he reported feeling worse. He had more difficulty exercising, he had gained weight. He had foot surgery and his balance had been worse since then. He had hammertoe surgery on 05/25/2015. Because of decrease in mobility after his surgery his weight increase. His tremor was worse. Memory was stable. I suggested we continue with Neupro 8 mg patch daily. I suggested he continue with Azilect once daily. I suggested we start him on the rytary 95 mg strength with gradual titration. However, he called in July reporting that the medication was too expensive, I therefore, switched him to Sinemet generic. He also requested a handicap placard form to be filled out which I provided in September.  Today, 12/04/2015: He reports ongoing issues with his foot, s/p hammertoe surgery in April 2017. He has more stiffness on the right side and more tremors, since starting Sinemet he has not noted a telltale difference in his symptoms, wife reports that his tremor seems to be about the same. He has seen over the course of time at least 3 podiatrist and to orthopedic doctors and uses inserts, his pain is all over the foot on the right side and sometimes in the ball of the foot. He does not roll off very well with his right foot. He has not fallen. Constipation is not a big issue currently. Mood and memory are stable. He does not sleep very well at night and has sleep maintenance issues, these are not new. According to his wife he has never been a good sleeper. She has not noted any apneic pauses while he is asleep but he does snore. They typically don't  sleep in the same than on a night to night basis. They are scheduled to attend a seminar on Parkinson's disease and DBS treatment at [facility] in November. They are interested in learning more about the DBS treatment option for PD and would welcome a referral for this as well.   Previously:  Of note, he missed an appointment on 06/20/2015 due to being sick. I saw him on 12/01/2014, at which time he reported a change in his insurance, mood and memory were stable, lower extremity swelling was stable, we kept his Neupro patch at 8 mg an Azilect 1 mg once daily the same. He was working full-time, also active with his 2 teenage boys, 58 year old an 47 year old. His wife had a recent change in her job.     I saw him on 08/02/2014, at which time he reported doing well. He had some leg swelling. His right hand swelling was a little worse. Cognitively and mood wise he was stable. Balance may have been off at times but generally speaking he was doing well.    I saw him on 04/05/2014, at which time he reported feeling fairly stable but his wife felt his balance was not as good. He was working out with a Clinical research associate. He had picked up boxing. He was working full-time. He cut back on his sodas. I kept him on his medications, Neupro patch 6 mg and Azilect 1 mg. He had some ongoing issues with lower extremity swelling and I  referred him to vascular surgery for consultation. He was seen by a vascular specialist, Dr. Scot Dock on 04/13/2014. He had Doppler studies to his lower extremities and was advised he had no DVT and good arterial flow. His swelling improved.    I saw him on 12/03/2013, at which time he reported doing well and had noted a benefit from Neupro patch 6 mg. I continued him on this dose as well as Azilect 1 mg once daily. His exam was stable.   I saw him on 07/28/2013, at which time he reported some worsening of his tremor and his gait. He was able to tolerate neupro patch 4 mg strength. He has had some skin  irritation, most likely from the adhesive. He was not exercising regularly but endorsed being active and working full-time. His memory was stable. He denies any impulse control disorder but had mild daytime somnolence which was not as severe as when he was on Requip long-acting.     I saw him on 02/05/2013, at which time I felt his physical exam was a little worse since stopping Requip XL. He had stopped this because of daytime somnolence and severe nausea. I suggested we start him on Neupro patch. I provided him with samples. We talked about doing a sleep study down the [place].     I first met him on 07/15/2012, at which time I suggested he start taking coenzyme Q 10. I also suggested that he switch his Requip XL 4 mg to nighttime because of report of daytime somnolence. I did not increase make any other changes to his medications and felt that he was overall stable.     He previously followed with Dr. Jeneen Rinks love and was last seen by him on 03/17/2012 at which time Dr. Erling Cruz increase his Requip XL and continued him on Azilect. He was diagnosed in 12/2008, and Sx go back to a year prior to that.   MRI brain without contrast was done in the past. He has been on rasagiline, which improved his tremor. 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 There is no family history of tremor. There is no history of REM behavior disorder. He has had no falls, or hallucinations or involuntary movements otherwise. He exercises not very regularly. His memory and mood have been stable. He has no problems with lower extremities swelling or compulsive thoughts or gambling. He works full-time as a Engineer, site.    His Past Medical History Is Significant For: Past Medical History:  Diagnosis Date  . Calculus of kidney   . Lesion of ulnar nerve   . Paralysis agitans ([place])   . Parkinson's disease ([place])   . Sleep disturbance, unspecified     His Past Surgical History Is  Significant For: Past Surgical History:  Procedure Laterality Date  . HAMMERTOE RECONSTRUCTION WITH WEIL OSTEOTOMY Right 05/25/2015   Procedure: RIGHT SECOND TOE METATARSAL WEIL OSTEOTOMY  HAMMERTOE CORRECTION COLLATERAL LIGAMENT REPAIR; FLEXOR TO EXTENSOR TRANSFER ;  Surgeon: Wylene Simmer, MD;  Location: [place];  Service: Orthopedics;  Laterality: Right;  . kidney stones  1991    His Family History Is Significant For: Family History  Problem Relation Age of Onset  . Heart failure Father   . Cancer Father   . Diabetes Father   . Sleep apnea Mother   . Sleep apnea Brother     His Social History Is Significant For: Social History   Social History  .  Marital status: Married    Spouse name: N/A  . Number of children: N/A  . Years of education: N/A   Social History Main Topics  . Smoking status: Never Smoker  . Smokeless tobacco: Never Used  . Alcohol use 1.8 oz/week    3 Standard drinks or equivalent per week     Comment: social  . Drug use: No  . Sexual activity: Not Asked   Other Topics Concern  . None   Social History Narrative   Consumes 1 soda a day     His Allergies Are:  No Known Allergies:   His Current Medications Are:  Outpatient Encounter Prescriptions as of 12/04/2015  Medication Sig  . carbidopa-levodopa (SINEMET IR) 25-100 MG tablet Take 1 tablet by mouth 3 (three) times daily.  . rasagiline (AZILECT) 1 MG TABS tablet Take 1 tablet (1 mg total) by mouth daily.  . Rotigotine (NEUPRO) 8 MG/24HR PT24 Place 8 mg onto the skin daily. Please use with 30 day Trial coupon brought by patient.   No facility-administered encounter medications on file as of 12/04/2015.   :  Review of Systems:  Out of a complete 14 point review of systems, all are reviewed and negative with the exception of these symptoms as listed below:  Review of Systems  Neurological:       Patient reports that he is still having foot issues.     Objective:   Neurologic Exam  Physical Exam Physical Examination:   Vitals:   12/04/15 0841  BP: 128/82  Pulse: 78  Resp: 16   General Examination: The patient is a very pleasant 60 y.o. male in no acute distress. He is in good spirits today, but less animated than usual, some pain reported.   HEENT: Normocephalic, atraumatic, pupils are equal, round and reactive to light and accommodation. Extraocular tracking shows mild saccadic breakdown without nystagmus noted. There is no limitation to his gaze. There is mild decrease in eye blink rate. Hearing is intact. Face is symmetric with mild facial masking and normal facial sensation. There is no lip, neck or jaw tremor. Neck is moderately rigid with intact passive ROM. There are no carotid bruits on auscultation. Oropharynx exam reveals mild mouth dryness. He has a mild to moderately crowded airway in that he has a floppy appearing soft palate and elongated/larger uvula. Mallampati is class II. Tongue protrudes centrally and palate elevates symmetrically. There is no drooling.   Chest: is clear to auscultation without wheezing, rhonchi or crackles noted.  Heart: sounds are regular and normal without murmurs, rubs or gallops noted.   Abdomen: is soft, non-tender and non-distended with normal bowel sounds appreciated on auscultation.  Extremities: There mild pitting edema in the distal lower extremities bilaterally, R more than L, stable from last time, mild hyperpigmentation noted.   Skin: is warm and dry with no trophic changes noted.  Musculoskeletal: exam reveals no obvious joint deformities, tenderness, joint swelling or erythema with the exception of right foot swelling, status post right second toe hammertoe repair, callus noted under the right big toe, bunion R big toe, right forefoot slightly warmer than left, good pulses.  Neurologically:  Mental status: The patient is awake and alert, paying good  attention. He is able to completely provide  the history and his wife provides details or additional information. He is oriented to: person, place, time/date, situation, day of week, month of year and year. His memory, attention, language and knowledge are intact. There is no aphasia,  agnosia, apraxia or anomia. There is a no bradyphrenia. Speech is mildly hypophonic with no dysarthria noted. Mood is congruent and affect is normal.   Cranial nerves are as described above under HEENT exam. In addition, shoulder shrug is normal, but right shoulder is a little higher than left shoulder.  Motor exam: Normal bulk, and strength for age is noted. There are no dyskinesias noted.  Tone is mildly rigid with presence of cogwheeling in the right upper extremity and RLE. There is overall moderate bradykinesia. There is no drift or rebound.  There is a moderate resting tremor in the right upper extremity. The tremor is fairly constant. There is no other tremor today.    Romberg is negative.   Reflexes are 1+ in the upper extremities and 1+ in the lower extremities.    Fine motor skills exam: Finger taps are moderately impaired on the right and mild to moderately impaired on the left. Hand movements are moderately impaired on the right and mildly impaired on the left. RAP (rapid alternating patting) is mildly impaired on the right and minimally impaired on the left. Foot taps are moderately impaired on the right and mildly impaired on the left. Foot agility (in the form of heel stomping) is moderately impaired on the right and minimally impaired on the left.    Cerebellar testing shows no dysmetria or intention tremor on finger to nose testing. Heel to shin is unremarkable bilaterally. There is no truncal or gait ataxia.   Sensory exam is intact to light touch in both upper and lower extremities.  Gait, station and balance: He stands up from the seated position with no significant difficulty but does need to push up with His hands. He needs no assistance.  No veering to one side is noted. He is noted to lean slightly to the R side. Posture is mild to moderately stooped for age. Stance is slightly wide-based. He walks with reduced stride length reduced pace and decreased arm swing on the right. Balance is overall preserved, but maybe slightly impaired. When walking barefoot, he does not roll off with the right foot as well.   Assessment and Plan:   In summary, [name] is a very pleasant 60 year old male with a history of Parkinson's disease, right-sided predominant, diagnosed in 2010 with symptoms going back to 2009 most likely. No history of REM behavior disorder, no history of obstructive sleep apnea but does snore and wife is advised to watch for apneic pauses when he is asleep. He has gained some weight after his first surgery in April 2017. He has been bothered more by right foot pain. Exam has been stable for quite some time but has had some more progression in the past several months. He does does have persistent lower extremity swelling which has been stable from last time. I have asked him to monitor this. He has had a Doppler ultrasound to his lower extremities and met with the vascular surgeon. The swelling could be secondary to the dopamine agonist, which is the Neupro patch, I explained to them. We need to monitor this ongoing. As far as his motor exam, he has had been fairly stable  for years, but his exam shows progression. We had increased his Neupro in June 2016. We will continue with this. Azilect has been 1 mg once daily. Neupro patch was started in December 2014 by me. He had SE on Requip XL. His exercise has become less since his right foot surgery has become  worse. He has seen multiple podiatrist and orthopedic surgeons for this and there is no new treatment option for this. I suggested for pain and also sleep consolidation at night we can try him on low-dose gabapentin starting at 100 mg each night with gradual titration to up to 300  mg. He is agreeable. I talked to him about potential side effects and the titration. He was given written instructions and a new prescription. Furthermore, I would like for him to increase his Sinemet to 1 pill 4 times a day, at 4 hourly interval, starting at 8 AM. I adjusted his prescription in that regard. In addition, we talked about DBS surgery today. I made a referral to [facility], he is scheduled to attend a seminar there as well coming up in early November. I will see him back in 3 months, sooner as needed. I answered all their questions today and the patient and his wife were in agreement.  I spent 30 minutes in total face-to-face time with the patient, more than 50% of which was spent in counseling and coordination of care, reviewing test results, reviewing medication and discussing or reviewing the diagnosis of PD, its prognosis and treatment options.

## 2015-12-04 NOTE — Telephone Encounter (Signed)
[name], Pharmacy Tech/[facility] [phone] called regarding faxed prescription received for carbidopa-levodopa (SINEMET IR) 25-100 MG tablet, states it came without a cover sheet from a fax number not familiar to our office [phone]), needs okay to fill this medication, please call and use reference# [id number].

## 2015-12-08 ENCOUNTER — Encounter: Payer: Self-pay | Admitting: Neurology

## 2016-03-05 ENCOUNTER — Encounter: Payer: Self-pay | Admitting: Neurology

## 2016-03-05 ENCOUNTER — Ambulatory Visit (INDEPENDENT_AMBULATORY_CARE_PROVIDER_SITE_OTHER): Payer: Managed Care, Other (non HMO) | Admitting: Neurology

## 2016-03-05 VITALS — BP 102/66 | HR 78 | Resp 18 | Ht 74.0 in | Wt 258.0 lb

## 2016-03-05 DIAGNOSIS — G20 Parkinson's disease: Secondary | ICD-10-CM | POA: Diagnosis not present

## 2016-03-05 DIAGNOSIS — R600 Localized edema: Secondary | ICD-10-CM | POA: Diagnosis not present

## 2016-03-05 DIAGNOSIS — M79671 Pain in right foot: Secondary | ICD-10-CM | POA: Diagnosis not present

## 2016-03-05 NOTE — Patient Instructions (Addendum)
We will look into the referral to [facility], Dr. Rubin PayorSiddiqui, they should be in touch with you or Annabelle Harmanana from our office will call you with details.  We will continue with the current medication regimen.  Please try to remember to take your sinemet on time.

## 2016-03-05 NOTE — Progress Notes (Signed)
Subjective:    Patient ID: [name] is a 61 y.o. male.  HPI     Interim history:   [name] is a 61 year old right-handed gentleman with an underlying history of hyperlipidemia and kidney stones, who presents for followup consultation of his right-sided predominant Parkinson's disease. He is accompanied by his wife today. I last saw him on 12/04/2015, at which time he reported ongoing issues with his right foot including status post surgery in April 2017 for hammertoes but he had ongoing issues with pain and more stiffness. He felt overall that his right side was more stiff and tremulous. Since starting Sinemet he had noted no telltale difference in his symptoms. He had consulted with different podiatrists and had tried orthotics. Mood and memory were stable. He was not sleeping very well. We talked about DBS treatment in more detail. I made a referral to Dr. Linus Mako at [facility]. I also increased his Sinemet to 1 pill 4 times a day. I suggested we try him on low-dose gabapentin for his right foot pain.  Today, 03/05/2016: He reports that he takes C/L 4 times a day, if he remembers. Low-dose gabapentin seems to have helped his foot pain and he takes it as needed. No recent falls, no recent mood or memory issues, no word on the referral from [facility]. Recent cold and congestion.    Previously:   I saw him on 08/01/2015, at which time he reported feeling worse. He had more difficulty exercising, he had gained weight. He had foot surgery and his balance had been worse since then. He had hammertoe surgery on 05/25/2015. Because of decrease in mobility after his surgery his weight increase. His tremor was worse. Memory was stable. I suggested we continue with Neupro 8 mg patch daily. I suggested he continue with Azilect once daily. I suggested we start him on the rytary 95 mg strength with gradual titration. However, he called in July reporting that the medication was too expensive, I therefore,  switched him to Sinemet generic. He also requested a handicap placard form to be filled out which I provided in September.   Of note, he missed an appointment on 06/20/2015 due to being sick. I saw him on 12/01/2014, at which time he reported a change in his insurance, mood and memory were stable, lower extremity swelling was stable, we kept his Neupro patch at 8 mg an Azilect 1 mg once daily the same. He was working full-time, also active with his 2 teenage boys, 59 year old an 52 year old. His wife had a recent change in her job.    I saw him on 08/02/2014, at which time he reported doing well. He had some leg swelling. His right hand swelling was a little worse. Cognitively and mood wise he was stable. Balance may have been off at times but generally speaking he was doing well.    I saw him on 04/05/2014, at which time he reported feeling fairly stable but his wife felt his balance was not as good. He was working out with a Clinical research associate. He had picked up boxing. He was working full-time. He cut back on his sodas. I kept him on his medications, Neupro patch 6 mg and Azilect 1 mg. He had some ongoing issues with lower extremity swelling and I referred him to vascular surgery for consultation. He was seen by a vascular specialist, Dr. Scot Dock on 04/13/2014. He had Doppler studies to his lower extremities and was advised he had no DVT and good arterial  flow. His swelling improved.    I saw him on 12/03/2013, at which time he reported doing well and had noted a benefit from Neupro patch 6 mg. I continued him on this dose as well as Azilect 1 mg once daily. His exam was stable.   I saw him on 07/28/2013, at which time he reported some worsening of his tremor and his gait. He was able to tolerate neupro patch 4 mg strength. He has had some skin irritation, most likely from the adhesive. He was not exercising regularly but endorsed being active and working full-time. His memory was stable. He denies any impulse  control disorder but had mild daytime somnolence which was not as severe as when he was on Requip long-acting.     I saw him on 02/05/2013, at which time I felt his physical exam was a little worse since stopping Requip XL. He had stopped this because of daytime somnolence and severe nausea. I suggested we start him on Neupro patch. I provided him with samples. We talked about doing a sleep study down the [place].     I first met him on 07/15/2012, at which time I suggested he start taking coenzyme Q 10. I also suggested that he switch his Requip XL 4 mg to nighttime because of report of daytime somnolence. I did not increase make any other changes to his medications and felt that he was overall stable.     He previously followed with Dr. Jeneen Rinks love and was last seen by him on 03/17/2012 at which time Dr. Erling Cruz increase his Requip XL and continued him on Azilect. He was diagnosed in 12/2008, and Sx go back to a year prior to that.   MRI brain without contrast was done in the past. He has been on rasagiline, which improved his tremor. 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 There is no family history of tremor. There is no history of REM behavior disorder. He has had no falls, or hallucinations or involuntary movements otherwise. He exercises not very regularly. His memory and mood have been stable. He has no problems with lower extremities swelling or compulsive thoughts or gambling. He works full-time as a Engineer, site.      His Past Medical History Is Significant For: Past Medical History:  Diagnosis Date  . Calculus of kidney   . Lesion of ulnar nerve   . Paralysis agitans ([place])   . Parkinson's disease ([place])   . Sleep disturbance, unspecified     His Past Surgical History Is Significant For: Past Surgical History:  Procedure Laterality Date  . HAMMERTOE RECONSTRUCTION WITH WEIL OSTEOTOMY Right 05/25/2015   Procedure: RIGHT SECOND TOE  METATARSAL WEIL OSTEOTOMY  HAMMERTOE CORRECTION COLLATERAL LIGAMENT REPAIR; FLEXOR TO EXTENSOR TRANSFER ;  Surgeon: Wylene Simmer, MD;  Location: [facility];  Service: Orthopedics;  Laterality: Right;  . kidney stones  1991    His Family History Is Significant For: Family History  Problem Relation Age of Onset  . Heart failure Father   . Cancer Father   . Diabetes Father   . Sleep apnea Mother   . Sleep apnea Brother     His Social History Is Significant For: Social History   Social History  . Marital status: Married    Spouse name: N/A  . Number of children: N/A  . Years of education: N/A   Social History Main Topics  . Smoking status: Never  Smoker  . Smokeless tobacco: Never Used  . Alcohol use 1.8 oz/week    3 Standard drinks or equivalent per week     Comment: social  . Drug use: No  . Sexual activity: Not Asked   Other Topics Concern  . None   Social History Narrative   Consumes 1 soda a day     His Allergies Are:  No Known Allergies:   His Current Medications Are:  Outpatient Encounter Prescriptions as of 03/05/2016  Medication Sig  . carbidopa-levodopa (SINEMET IR) 25-100 MG tablet Take 1 tablet by mouth 4 (four) times daily. At 8, 12, 4 PM and 8 PM  . gabapentin (NEURONTIN) 100 MG capsule Take 1 pill nightly at bedtime for 1 week, then 2 pills nightly for 1 week, then 3 pills each night thereafter.  . rasagiline (AZILECT) 1 MG TABS tablet Take 1 tablet (1 mg total) by mouth daily.  . Rotigotine (NEUPRO) 8 MG/24HR PT24 Place 8 mg onto the skin daily.   No facility-administered encounter medications on file as of 03/05/2016.   :  Review of Systems:  Out of a complete 14 point review of systems, all are reviewed and negative with the exception of these symptoms as listed below: Review of Systems  Neurological:       Patient states that he takes Sinemet 4 times a day if he remembers.  Reports that he uses Gabapentin as needed, states that it  does help his foot pain.  Patient reports that he has not heard back from Dr. Donna Christen office yet for an appt.     Objective:  Neurologic Exam  Physical Exam Physical Examination:   Vitals:   03/05/16 0844  BP: 102/66  Pulse: 78  Resp: 18   General Examination: The patient is a very pleasant 61 y.o. male in no acute distress. He is in good spirits today, better than last time, less pain.    HEENT: Normocephalic, atraumatic, pupils are equal, round and reactive to light and accommodation. Extraocular tracking shows mild saccadic breakdown without nystagmus noted. There is no limitation to his gaze. There is mild decrease in eye blink rate. Hearing is intact. Face is symmetric with mild to moderate facial masking and normal facial sensation. There is no lip, neck or jaw tremor. Neck is moderately rigid with intact passive ROM. There are no carotid bruits on auscultation. Oropharynx exam reveals mild mouth dryness. He has a mild to moderately crowded airway in that he has a floppy appearing soft palate and elongated/larger uvula. Mallampati is class II. Tongue protrudes centrally and palate elevates symmetrically. There is no drooling.   Chest: is clear to auscultation without wheezing, rhonchi or crackles noted.  Heart: sounds are regular and normal without murmurs, rubs or gallops noted.   Abdomen: is soft, non-tender and non-distended with normal bowel sounds appreciated on auscultation.  Extremities: There mild pitting edema in the distal lower extremities bilaterally, R more than L, slightly less than last time, mild hyperpigmentation noted.   Skin: is warm and dry with no trophic changes noted.  Musculoskeletal: exam reveals no obvious joint deformities, tenderness, joint swelling or erythema with the exception of mild right foot swelling, (status post right second toe hammertoe repair).  Neurologically:  Mental status: The patient is awake and alert, paying good attention. He  is able to completely provide the history and his wife provides details or additional information. He is oriented to: person, place, time/date, situation, day of week, month of year  and year. His memory, attention, language and knowledge are intact. There is no aphasia, agnosia, apraxia or anomia. There is a no bradyphrenia. Speech is mildly hypophonic with no dysarthria noted. Mood is congruent and affect is normal.   Cranial nerves are as described above under HEENT exam. In addition, shoulder shrug is normal, but right shoulder is a little higher than left shoulder, stable.  Motor exam: Normal bulk, and strength for age is noted. There are no dyskinesias noted.  Tone is mildly rigid with presence of cogwheeling in the right upper extremity and RLE. There is overall moderate bradykinesia. There is no drift or rebound.  There is a moderate resting tremor in the right upper extremity and mild and intermittent on the LUE.  Romberg is negative.   Reflexes are 1+ in the upper extremities and 1+ in the lower extremities.    Fine motor skills exam: Finger taps are moderately impaired on the right and mild to moderately impaired on the left. Hand movements are moderately impaired on the right and mildly impaired on the left. RAP (rapid alternating patting) is mildly impaired on the right and minimally impaired on the left. Foot taps are moderately impaired on the right and mildly impaired on the left. Foot agility (in the form of heel stomping) is moderately impaired on the right and minimally impaired on the left.    Cerebellar testing shows no dysmetria or intention tremor on finger to nose testing. Heel to shin is unremarkable bilaterally. There is no truncal or gait ataxia.   Sensory exam is intact to light touch in both upper and lower extremities.  Gait, station and balance: He stands up from the seated position with no significant difficulty but does need to push up with His hands. He needs no  assistance. No veering to one side is noted. He is noted to lean slightly to the R side. Posture is mild to moderately stooped for age. Stance is slightly wide-based. He walks with reduced stride length reduced pace and decreased arm swing on the right. Balance is overall preserved, but maybe slightly impaired.   Assessment and Plan:   In summary, [name] is a very pleasant 61 year old male with a history of Parkinson's disease, right-sided predominant, diagnosed in 2010 with symptoms going back to 2009 most likely. No telltale history of RBD but has noted some vivid dreams recently, no history suggestive of OSA. Mood and memory are stable, foot pain has improved thankfully and he uses gabapentin low-dose as needed, 300 mg at night when he takes it. He has had no side effects from this. He has been on Sinemet 1 pill 4 times a day but does not always remember it. He is advised to try to be consistent with that. Furthermore, he is on Azilect 1 mg once daily generic and Neupro patch 8 mg once daily which has been helpful. Exam is stable or slightly improved from last time particularly since his foot pain is better.  He does does have persistent lower extremity swelling which has been stable from last time. He had a Doppler ultrasound to his lower extremities and met with the vascular surgeon. The swelling could be secondary to the dopamine agonist, which is the Neupro patch, I explained to them, perhaps in part. As far as his motor exam, he has had some progression with time which is of course expected. I had referred him to [facility], Dr. Donna Christen office for potential DBS candidacy, this has not yet  come to fruition. We would look into it. I had increased his Neupro in June 2016. We will continue with this. Azilect has been 1 mg once daily. Neupro patch was started in December 2014 by me. He had SE on Requip XL. His exercise had become less since his right foot surgery and exacerbation of his foot  pain but lately he has been able to exercise a little more and uses a stationary bike. He also participates in boxing for Parkinson's disease. For his foot issues he has consulted with multiple podiatrist and orthopedic surgeons, he has been released from surgical care. He was also given inserts but does not typically use them at this time. I will see him back in 4 months, sooner as needed. I answered all their questions today and the patient and his wife were in agreement.  I spent 25 minutes in total face-to-face time with the patient, more than 50% of which was spent in counseling and coordination of care, reviewing test results, reviewing medication and discussing or reviewing the diagnosis of PD, its prognosis and treatment options.

## 2016-05-29 ENCOUNTER — Other Ambulatory Visit: Payer: Self-pay

## 2016-05-29 DIAGNOSIS — G20 Parkinson's disease: Secondary | ICD-10-CM

## 2016-05-29 MED ORDER — ROTIGOTINE 8 MG/24HR TD PT24: 8.0000 mg | MEDICATED_PATCH | Freq: Every day | TRANSDERMAL | 1 refills | Status: DC

## 2016-07-04 ENCOUNTER — Ambulatory Visit (INDEPENDENT_AMBULATORY_CARE_PROVIDER_SITE_OTHER): Payer: Managed Care, Other (non HMO) | Admitting: Neurology

## 2016-07-04 ENCOUNTER — Encounter: Payer: Self-pay | Admitting: Neurology

## 2016-07-04 VITALS — BP 120/78 | HR 76 | Resp 16 | Ht 74.0 in | Wt 260.0 lb

## 2016-07-04 DIAGNOSIS — R600 Localized edema: Secondary | ICD-10-CM | POA: Diagnosis not present

## 2016-07-04 DIAGNOSIS — G20 Parkinson's disease: Secondary | ICD-10-CM | POA: Diagnosis not present

## 2016-07-04 MED ORDER — CARBIDOPA-LEVODOPA 25-100 MG PO TABS: 1.5000 | ORAL_TABLET | Freq: Four times a day (QID) | ORAL | 3 refills | Status: DC

## 2016-07-04 MED ORDER — ROTIGOTINE 6 MG/24HR TD PT24: 1.0000 | MEDICATED_PATCH | Freq: Every day | TRANSDERMAL | 12 refills | Status: DC

## 2016-07-04 NOTE — Patient Instructions (Signed)
We will increase your Sinemet to 1 1/2 pills 4 times a day.  We will reduce your Neupro to 6 mg.  Please call Christy with Dr. Carloyn JaegerSiddiqui's office to schedule with Psychologist and surgical consultation.

## 2016-07-04 NOTE — Progress Notes (Signed)
Subjective:    Patient ID: [name] is a 61 y.o. male.  HPI     Interim history:   [name] is a 61 year old right-handed gentleman with an underlying history of hyperlipidemia and kidney stones, who presents for followup consultation of his right-sided predominant Parkinson's disease. He is accompanied by his wife today. I last saw him on 03/05/2016, at which time he reported taking C/L 4 times a day, but not always all 4 doses, as he would forget at times. Low-dose gabapentin prn was helping his foot pain. No recent falls, no recent mood or memory issues, no word on the referral from [name]. Had a recent cold and congestion. He was advised to continue with the current management, including Sinemet 1 pill 4 times a day, Azilect 1 mg once daily, Neupro patch 8 mg once daily, gabapentin 300 mg at night as needed.  I resubmitted referral to [name] for DBS Eval and patient was seen in the interim by Dr. Linus [name] on 05/24/16. I reviewed the note.   Today, 07/04/2016: He reports doing okay, will likely go forward with DBS surgery, perhaps later this year early December. He is apprehensive about this. Has not yet scheduled psychological evaluation and surgical consultation. He was deemed a good candidate for DBS treatment and is in the process of completing evaluation for this, hopefully in the next coming months. He has been able to talk to another patient to underwent DBS surgery at [name] with very good results.he has been taking his Sinemet more regularly and noticed an improvement of his symptoms. Lower extremity swelling is slightly worse. Neupro is working well at 8 mg strength. He was too sedated from taking gabapentin and has not been taking it recently. His foot pain is a little better overall.  The patient's allergies, current medications, family history, past medical history, past social history, past surgical history and problem list were reviewed and updated as appropriate.   Previously  (copied from previous notes for reference):   I saw him on 12/04/2015, at which time he reported ongoing issues with his right foot including status post surgery in April 2017 for hammertoes but he had ongoing issues with pain and more stiffness. He felt overall that his right side was more stiff and tremulous. Since starting Sinemet he had noted no telltale difference in his symptoms. He had consulted with different podiatrists and had tried orthotics. Mood and memory were stable. He was not sleeping very well. We talked about DBS treatment in more detail. I made a referral to Dr. Linus [name] at [facility]. I also increased his Sinemet to 1 pill 4 times a day. I suggested we try him on low-dose gabapentin for his right foot pain.   I saw him on 08/01/2015, at which time he reported feeling worse. He had more difficulty exercising, he had gained weight. He had foot surgery and his balance had been worse since then. He had hammertoe surgery on 05/25/2015. Because of decrease in mobility after his surgery his weight increase. His tremor was worse. Memory was stable. I suggested we continue with Neupro 8 mg patch daily. I suggested he continue with Azilect once daily. I suggested we start him on the rytary 95 mg strength with gradual titration. However, he called in July reporting that the medication was too expensive, I therefore, switched him to Sinemet generic. He also requested a handicap placard form to be filled out which I provided in September.   Of note, he missed an  appointment on 06/20/2015 due to being sick. I saw him on 12/01/2014, at which time he reported a change in his insurance, mood and memory were stable, lower extremity swelling was stable, we kept his Neupro patch at 8 mg an Azilect 1 mg once daily the same. He was working full-time, also active with his 2 teenage boys, 59 year old an 71 year old. His wife had a recent change in her job.    I saw him on 08/02/2014, at which time he reported  doing well. He had some leg swelling. His right hand swelling was a little worse. Cognitively and mood wise he was stable. Balance may have been off at times but generally speaking he was doing well.    I saw him on 04/05/2014, at which time he reported feeling fairly stable but his wife felt his balance was not as good. He was working out with a Clinical research associate. He had picked up boxing. He was working full-time. He cut back on his sodas. I kept him on his medications, Neupro patch 6 mg and Azilect 1 mg. He had some ongoing issues with lower extremity swelling and I referred him to vascular surgery for consultation. He was seen by a vascular specialist, [name] on 04/13/2014. He had Doppler studies to his lower extremities and was advised he had no DVT and good arterial flow. His swelling improved.    I saw him on 12/03/2013, at which time he reported doing well and had noted a benefit from Neupro patch 6 mg. I continued him on this dose as well as Azilect 1 mg once daily. His exam was stable.   I saw him on 07/28/2013, at which time he reported some worsening of his tremor and his gait. He was able to tolerate neupro patch 4 mg strength. He has had some skin irritation, most likely from the adhesive. He was not exercising regularly but endorsed being active and working full-time. His memory was stable. He denies any impulse control disorder but had mild daytime somnolence which was not as severe as when he was on Requip long-acting.     I saw him on 02/05/2013, at which time I felt his physical exam was a little worse since stopping Requip XL. He had stopped this because of daytime somnolence and severe nausea. I suggested we start him on Neupro patch. I provided him with samples. We talked about doing a sleep study down the [place].     I first met him on 07/15/2012, at which time I suggested he start taking coenzyme Q 10. I also suggested that he switch his Requip XL 4 mg to nighttime because of report of  daytime somnolence. I did not increase make any other changes to his medications and felt that he was overall stable.     He previously followed with Dr. Jeneen Rinks love and was last seen by him on 03/17/2012 at which time Dr. Erling Cruz increase his Requip XL and continued him on Azilect. He was diagnosed in 12/2008, and Sx go back to a year prior to that.   MRI brain without contrast was done in the past. He has been on rasagiline, which improved his tremor. 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 There is no family history of tremor. There is no history of REM behavior disorder. He has had no falls, or hallucinations or involuntary movements otherwise. He exercises not very regularly. His memory and mood have been stable. He has no problems  with lower extremities swelling or compulsive thoughts or gambling. He works full-time as a Engineer, site.    His Past Medical History Is Significant For: Past Medical History:  Diagnosis Date  . Calculus of kidney   . Lesion of ulnar nerve   . Paralysis agitans ([place])   . Parkinson's disease ([place])   . Sleep disturbance, unspecified     His Past Surgical History Is Significant For: Past Surgical History:  Procedure Laterality Date  . HAMMERTOE RECONSTRUCTION WITH WEIL OSTEOTOMY Right 05/25/2015   Procedure: RIGHT SECOND TOE METATARSAL WEIL OSTEOTOMY  HAMMERTOE CORRECTION COLLATERAL LIGAMENT REPAIR; FLEXOR TO EXTENSOR TRANSFER ;  Surgeon: Wylene Simmer, MD;  Location: [place];  Service: Orthopedics;  Laterality: Right;  . kidney stones  1991    His Family History Is Significant For: Family History  Problem Relation Age of Onset  . Heart failure Father   . Cancer Father   . Diabetes Father   . Sleep apnea Mother   . Sleep apnea Brother     His Social History Is Significant For: Social History   Social History  . Marital status: Married    Spouse name: N/A  . Number of children: N/A  .  Years of education: N/A   Social History Main Topics  . Smoking status: Never Smoker  . Smokeless tobacco: Never Used  . Alcohol use 1.8 oz/week    3 Standard drinks or equivalent per week     Comment: social  . Drug use: No  . Sexual activity: Not Asked   Other Topics Concern  . None   Social History Narrative   Consumes 1 soda a day     His Allergies Are:  No Known Allergies:   His Current Medications Are:  Outpatient Encounter Prescriptions as of 07/04/2016  Medication Sig  . carbidopa-levodopa (SINEMET IR) 25-100 MG tablet Take 1 tablet by mouth 4 (four) times daily. At 8, 12, 4 PM and 8 PM  . rasagiline (AZILECT) 1 MG TABS tablet Take 1 tablet (1 mg total) by mouth daily.  . Rotigotine (NEUPRO) 8 MG/24HR PT24 Place 8 mg onto the skin daily.  Marland Kitchen gabapentin (NEURONTIN) 100 MG capsule Take 1 pill nightly at bedtime for 1 week, then 2 pills nightly for 1 week, then 3 pills each night thereafter. (Patient not taking: Reported on 07/04/2016)   No facility-administered encounter medications on file as of 07/04/2016.   :  Review of Systems:  Out of a complete 14 point review of systems, all are reviewed and negative with the exception of these symptoms as listed below:  Review of Systems  Neurological:       Patient states that he is not taking Gabapentin, reports that it is making him too sleepy.  Patient has seen Dr. Linus [name], and feels that he may go through the surgery.     Objective:  Neurologic Exam  Physical Exam Physical Examination:   Vitals:   07/04/16 0842  BP: 120/78  Pulse: 76  Resp: 16    General Examination: The patient is a very pleasant 61 y.o. male in no acute distress. He appears well-developed and well-nourished and well groomed. Good spirits.   HEENT: Normocephalic, atraumatic, pupils are equal, round and reactive to light and accommodation. Extraocular tracking shows mild saccadic breakdown without nystagmus noted. There is no limitation to his  gaze. There is mild decrease in eye blink rate. Hearing is intact. Face is symmetric with moderate facial masking and normal  facial sensation. There is no lip, neck or jaw tremor. Neck is moderately rigid with intact passive ROM. There are no carotid bruits on auscultation. Oropharynx exam reveals mild mouth dryness. He has a mild to moderately crowded airway in that he has a floppy appearing soft palate and elongated/larger uvula. Mallampati is class II. Tongue protrudes centrally and palate elevates symmetrically. There is no drooling.   Chest: is clear to auscultation without wheezing, rhonchi or crackles noted.  Heart: sounds are regular and normal without murmurs, rubs or gallops noted.   Abdomen: is soft, non-tender and non-distended with normal bowel sounds appreciated on auscultation.  Extremities: There 1-2 +pitting edema in the distal lower extremities bilaterally, R more than L,  hyperpigmentation noted distally.   Skin: is warm and dry with no trophic changes noted.  Musculoskeletal: exam reveals no obvious joint deformities, tenderness, joint swelling or erythema with the exception of mild right foot swelling, (status post right second toe hammertoe repair). Mild purplish discoloration of the right forefoot, not tender on palpation.  Neurologically:  Mental status: The patient is awake and alert, paying good attention. He is able to completely provide the history and his wife provides details and additional information. He is oriented to: person, place, time/date, situation, day of week, month of year and year. His memory, attention, language and knowledge are intact. There is no aphasia, agnosia, apraxia or anomia. There is a no bradyphrenia. Speech is mildly hypophonic with no dysarthria noted. Mood is congruent and affect is normal.   Cranial nerves are as described above under HEENT exam. In addition, shoulder shrug is normal, but right shoulder is a little higher than left  shoulder, stable findings.  Motor exam: Normal bulk, and strength for age is noted. There are no dyskinesias noted.  Tone is mild to moderately rigid with presence of cogwheeling in the right upper extremity and RLE. There is overall moderate bradykinesia. There is no drift or rebound.  There is a moderate resting tremor in the right upper extremity and mild and intermittent on the LUE.  Romberg is negative.   Reflexes are 1+ in the upper extremities and 1+ in the knees, absent in the ankles.   Fine motor skills exam: Finger taps are moderately impaired on the right and mild to moderately impaired on the left. Hand movements are moderately impaired on the right and mildly impaired on the left. RAP (rapid alternating patting) is mildly impaired on the right and minimally impaired on the left. Foot taps are moderately impaired on the right and mildly impaired on the left. Foot agility (in the form of heel stomping) is moderately impaired on the right and minimally impaired on the left.    Cerebellar testing shows no dysmetria or intention tremor on finger to nose testing. Heel to shin is unremarkable bilaterally. There is no truncal or gait ataxia.   Sensory exam is intact to light touch in both upper and lower extremities.  Gait, station and balance: He stands up from the seated position with no significant difficulty but does need to push up with His hands. He needs no assistance. No veering to one side is noted. He is noted to lean slightly to the R side. Posture is mild to moderately stooped for age. Stance is slightly wide-based. He walks with reduced stride length reduced pace and decreased arm swing on the right. Balance is overall preserved. Turns okay.   Assessment and Plan:   In summary, SIR MALLIS is a very  pleasant 61 year old male with a history of Parkinson's disease, right-sided predominant, diagnosed in 2010 with symptoms going back to 2009 most likely. No telltale  history of RBD but has noted some vivid dreams recently, no history suggestive of OSA. Mood and memory are stable, foot pain has improved thankfully and he quit using gabapentin, which also cause sedation. He has been taking Sinemet 1 pill 4 times in the more consistently. 'm worried about his lower extremity swelling, this appears worse, I suggested we try to reduce his Neupro patch to 6 mg daily and increase his Sinemet to 1-1/2 pills 4 times. He is in the process of being evaluated for DBS surgery at [name]. He is encouraged to call the referral coordinator to schedule his psychological evaluation and surgical consultation as well. He is advised to continue with generic Azilect 1 mg once daily. I adjusted the prescription for Neupro patch as well as Sinemet. I suggested a 4 month follow-up, sooner as needed. I answered all their questions today and the patient and his wife were in agreement. I spent 30 minutes in total face-to-face time with the patient, more than 50% of which was spent in counseling and coordination of care, reviewing test results, reviewing medication and discussing or reviewing the diagnosis of PD, its prognosis and treatment options. Pertinent laboratory and imaging test results that were available during this visit with the patient were reviewed by me and considered in my medical decision making (see chart for details).

## 2016-08-13 ENCOUNTER — Other Ambulatory Visit: Payer: Self-pay

## 2016-08-13 MED ORDER — ROTIGOTINE 6 MG/24HR TD PT24: 1.0000 | MEDICATED_PATCH | Freq: Every day | TRANSDERMAL | 2 refills | Status: DC

## 2016-11-01 ENCOUNTER — Other Ambulatory Visit: Payer: Self-pay | Admitting: Neurology

## 2016-11-01 DIAGNOSIS — G20 Parkinson's disease: Secondary | ICD-10-CM

## 2016-11-05 ENCOUNTER — Encounter: Payer: Self-pay | Admitting: Neurology

## 2016-11-05 ENCOUNTER — Ambulatory Visit (INDEPENDENT_AMBULATORY_CARE_PROVIDER_SITE_OTHER): Payer: Managed Care, Other (non HMO) | Admitting: Neurology

## 2016-11-05 VITALS — BP 124/86 | HR 83 | Ht 74.5 in | Wt 265.0 lb

## 2016-11-05 DIAGNOSIS — G20 Parkinson's disease: Secondary | ICD-10-CM

## 2016-11-05 NOTE — Patient Instructions (Signed)
We will keep your meds the same and I would like for you to work on weight reduction, especially, in light of your DBS surgery.

## 2016-11-05 NOTE — Progress Notes (Signed)
Subjective:    Patient ID: [name] is a 61 y.o. male.  HPI     Interim history:   [name] is a 61 year old right-handed gentleman with an underlying history of hyperlipidemia and kidney stones, who presents for followup consultation of his right-sided predominant Parkinson's disease. He is accompanied by his wife again today. I last saw him on 07/04/2016, at which time he reported doing okay, was planning to pursue DBS surgery perhaps later this year. He was quite apprehensive. He had not scheduled his psychological evaluation or surgical consultation. He was overall considered a good candidate for DBS treatment per neurology. He was more on time with his Sinemet and noticed improvement in his symptoms, lower extremity swelling was slightly worse. Neupro was working well at 8 mg, but d/t swelling, I suggested with reduce it. I also suggested we increase his Sinemet to 1-1/2 pills for times a day in lieu of keeping the Neupro at 8 mg daily. He was no longer taking gabapentin and his foot pain was indeed a little bit better with time, gabapentin made him too sleepy.   Today, 11/05/2016: He reports doing okay, has not noticed any repercussions after reducing the Neupro and increasing the Sinemet slightly. He feels that his swelling has come down a little bit, wife agrees. He has psychology evaluation pending for this week and saw the neurosurgeon recently, has been deemed a good candidate for initially left sided STN DBS. He is still hoping to schedule his DBS surgery in December.     The patient's allergies, current medications, family history, past medical history, past social history, past surgical history and problem list were reviewed and updated as appropriate.    Previously (copied from previous notes for reference):   I saw him on 03/05/2016, at which time he reported taking C/L 4 times a day, but not always all 4 doses, as he would forget at times. Low-dose gabapentin prn was  helping his foot pain. No recent falls, no recent mood or memory issues, no word on the referral from [facility]. Had a recent cold and congestion. He was advised to continue with the current management, including Sinemet 1 pill 4 times a day, Azilect 1 mg once daily, Neupro patch 8 mg once daily, gabapentin 300 mg at night as needed.   I resubmitted referral to [facility] for DBS Eval and patient was seen in the interim by Dr. Linus Mako on 05/24/16. I reviewed the note.     I saw him on 12/04/2015, at which time he reported ongoing issues with his right foot including status post surgery in April 2017 for hammertoes but he had ongoing issues with pain and more stiffness. He felt overall that his right side was more stiff and tremulous. Since starting Sinemet he had noted no telltale difference in his symptoms. He had consulted with different podiatrists and had tried orthotics. Mood and memory were stable. He was not sleeping very well. We talked about DBS treatment in more detail. I made a referral to Dr. Linus Mako at [facility]. I also increased his Sinemet to 1 pill 4 times a day. I suggested we try him on low-dose gabapentin for his right foot pain.   I saw him on 08/01/2015, at which time he reported feeling worse. He had more difficulty exercising, he had gained weight. He had foot surgery and his balance had been worse since then. He had hammertoe surgery on 05/25/2015. Because of decrease in mobility after his surgery his weight  increase. His tremor was worse. Memory was stable. I suggested we continue with Neupro 8 mg patch daily. I suggested he continue with Azilect once daily. I suggested we start him on the rytary 95 mg strength with gradual titration. However, he called in July reporting that the medication was too expensive, I therefore, switched him to Sinemet generic. He also requested a handicap placard form to be filled out which I provided in September.   Of note, he missed an appointment on 06/20/2015  due to being sick. I saw him on 12/01/2014, at which time he reported a change in his insurance, mood and memory were stable, lower extremity swelling was stable, we kept his Neupro patch at 8 mg an Azilect 1 mg once daily the same. He was working full-time, also active with his 2 teenage boys, 59 year old an 50 year old. His wife had a recent change in her job.    I saw him on 08/02/2014, at which time he reported doing well. He had some leg swelling. His right hand swelling was a little worse. Cognitively and mood wise he was stable. Balance may have been off at times but generally speaking he was doing well.    I saw him on 04/05/2014, at which time he reported feeling fairly stable but his wife felt his balance was not as good. He was working out with a Clinical research associate. He had picked up boxing. He was working full-time. He cut back on his sodas. I kept him on his medications, Neupro patch 6 mg and Azilect 1 mg. He had some ongoing issues with lower extremity swelling and I referred him to vascular surgery for consultation. He was seen by a vascular specialist, Dr. Scot Dock on 04/13/2014. He had Doppler studies to his lower extremities and was advised he had no DVT and good arterial flow. His swelling improved.    I saw him on 12/03/2013, at which time he reported doing well and had noted a benefit from Neupro patch 6 mg. I continued him on this dose as well as Azilect 1 mg once daily. His exam was stable.   I saw him on 07/28/2013, at which time he reported some worsening of his tremor and his gait. He was able to tolerate neupro patch 4 mg strength. He has had some skin irritation, most likely from the adhesive. He was not exercising regularly but endorsed being active and working full-time. His memory was stable. He denies any impulse control disorder but had mild daytime somnolence which was not as severe as when he was on Requip long-acting.     I saw him on 02/05/2013, at which time I felt his physical  exam was a little worse since stopping Requip XL. He had stopped this because of daytime somnolence and severe nausea. I suggested we start him on Neupro patch. I provided him with samples. We talked about doing a sleep study down the [place].     I first met him on 07/15/2012, at which time I suggested he start taking coenzyme Q 10. I also suggested that he switch his Requip XL 4 mg to nighttime because of report of daytime somnolence. I did not increase make any other changes to his medications and felt that he was overall stable.     He previously followed with Dr. Jeneen Rinks love and was last seen by him on 03/17/2012 at which time Dr. Erling Cruz increase his Requip XL and continued him on Azilect. He was diagnosed in 12/2008, and Sx go back to  a year prior to that.   MRI brain without contrast was done in the past. He has been on rasagiline, which improved his tremor. 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 There is no family history of tremor. There is no history of REM behavior disorder. He has had no falls, or hallucinations or involuntary movements otherwise. He exercises not very regularly. His memory and mood have been stable. He has no problems with lower extremities swelling or compulsive thoughts or gambling. He works full-time as a Engineer, site.      His Past Medical History Is Significant For: Past Medical History:  Diagnosis Date  . Calculus of kidney   . Lesion of ulnar nerve   . Paralysis agitans ([place])   . Parkinson's disease ([place])   . Sleep disturbance, unspecified     His Past Surgical History Is Significant For: Past Surgical History:  Procedure Laterality Date  . HAMMERTOE RECONSTRUCTION WITH WEIL OSTEOTOMY Right 05/25/2015   Procedure: RIGHT SECOND TOE METATARSAL WEIL OSTEOTOMY  HAMMERTOE CORRECTION COLLATERAL LIGAMENT REPAIR; FLEXOR TO EXTENSOR TRANSFER ;  Surgeon: Wylene Simmer, MD;  Location: [place];  Service:  Orthopedics;  Laterality: Right;  . kidney stones  1991    His Family History Is Significant For: Family History  Problem Relation Age of Onset  . Heart failure Father   . Cancer Father   . Diabetes Father   . Sleep apnea Mother   . Sleep apnea Brother     His Social History Is Significant For: Social History   Social History  . Marital status: Married    Spouse name: N/A  . Number of children: N/A  . Years of education: N/A   Social History Main Topics  . Smoking status: Never Smoker  . Smokeless tobacco: Never Used  . Alcohol use 1.8 oz/week    3 Standard drinks or equivalent per week     Comment: social  . Drug use: No  . Sexual activity: Not Asked   Other Topics Concern  . None   Social History Narrative   Consumes 1 soda a day     His Allergies Are:  No Known Allergies:   His Current Medications Are:  Outpatient Encounter Prescriptions as of 11/05/2016  Medication Sig  . carbidopa-levodopa (SINEMET IR) 25-100 MG tablet Take 1.5 tablets by mouth 4 (four) times daily. At 8, 12, 4 PM and 8 PM  . rasagiline (AZILECT) 1 MG TABS tablet Take 1 tablet (1 mg total) by mouth daily.  . rotigotine (NEUPRO) 6 MG/24HR Place 1 patch onto the skin daily.  . [DISCONTINUED] rasagiline (AZILECT) 1 MG TABS tablet TAKE 1 TABLET DAILY   No facility-administered encounter medications on file as of 11/05/2016.   :  Review of Systems:  Out of a complete 14 point review of systems, all are reviewed and negative with the exception of these symptoms as listed below: Review of Systems  Neurological:       Pt presents today to discuss his PD. Pt is still considering DBS, and has a psychology evaluation for it this week.    Objective:  Neurological Exam  Physical Exam Physical Examination:   Vitals:   11/05/16 0936  BP: 124/86  Pulse: 83    General Examination: The patient is a very pleasant 61 y.o. male in no acute distress. He appears well-developed and well-nourished  and well groomed. he has gained weight with time. Since  June 2014 he has gained about 25 pounds.   HEENT:Normocephalic, atraumatic, pupils are equal, round and reactive to light and accommodation. Extraocular tracking shows mild saccadic breakdown without nystagmus noted. There is no limitation to his gaze. There is mild decrease in eye blink rate. Hearing is intact. Face is symmetric with moderatefacial masking and normal facial sensation. There is no lip, neck or jaw tremor. Neck is moderately rigid with intact passive ROM. There are no carotid bruits on auscultation. Oropharynx exam reveals  no new findings. There is no sialorrhea.    Chest:is clear to auscultation without wheezing, rhonchi or crackles noted.  Heart:sounds are regular and normal without murmurs, rubs or gallops noted.   Abdomen:is soft, non-tender and non-distended with normal bowel sounds appreciated on auscultation.  Extremities:There 1-2 +pitting edema in the distal lower extremities bilaterally, slightly better, some hyperpigmentation noted distally.   Skin: is warm and dry with no trophic changes noted.  Musculoskeletal: exam reveals no new findings. Of note, he is status post right second toe hammertoe repair.  Neurologically:  Mental status: The patient is awake and alert, paying good attention. He is able to completely provide the history and his wife provides details and additional information. He is oriented to: person, place, time/date, situation, day of week, month of year and year. His memory, attention, language and knowledge are intact. There is no aphasia, agnosia, apraxia or anomia. There is a no bradyphrenia. Speech is mildly hypophonic with no dysarthria noted. Mood is congruent and affect is normal.   Cranial nerves are as described above under HEENT exam. In addition, shoulder shrug is normal, but right shoulder is a little higher than left shoulder, stable findings.  Motor exam: Normal  bulk, and strength for age is noted. There are no dyskinesias noted.  Tone is mild to moderately rigid with presence of cogwheeling in the right upper extremity and RLE. There is overall moderate bradykinesia. There is no drift or rebound.  There is a moderate resting tremor in the right upper extremity and mild and intermittent on the LUE.   Reflexes are 1+ in the upper extremities and 1+ in the knees, absent in the ankles.   Fine motor skills exam:  moderate impairment noted on the right, mild to moderate impairment on the left.   Cerebellar testing shows no dysmetria or intention tremor on finger to nose testing. Heel to shin is unremarkable bilaterally. There is no truncal or gait ataxia.   Sensory exam is intact to light touch in both upper and lower extremities.  Gait, station and balance: He stands up from the seated position with no significant difficulty but does need to push up with His hands. He needs no assistance. No veering to one side is noted. He is noted to lean slightly to the R side. Posture is mild to moderately stooped for age. Stance is slightly wide-based. He walks with reduced stride length reduced pace and decreased arm swing on the right. Balance is overall preserved. Turns okay.   Assessment and Plan:   In summary, ORREN PIETSCH is a very pleasant 61 year old male with a history of Parkinson's disease, right-sided predominant, diagnosed in 2010 with symptoms going back to 2009 most likely. He had no telltale history of RBD, had some vivid dreams in the recent past. lower extremity swelling is slightly better, we reduced his Neupro from 8 mg to 6 mg in May 2018 as well as increased his Sinemet to 1-1/2 pills 4 times a day  from 1 pill 4 times a day at the time. He has made progress in his DBS evaluation, has seen the surgeon on 10/22/2016, has been deemed a good candidate for surgical intervention and is scheduled next week to see the psychologist. He and his wife  are hoping to schedule his first DBS surgery in December during the winter break.  I suggested we continue his medication regimen and keep everything stable. He is strongly encouraged to work on weight reduction, especially in preparation of his DBS surgery. He is going to continue with Azilect 1 mg once daily as well. I suggested a 6 month follow-up, sooner as needed. I answered all their questions today and the patient and his wife were in agreement.  I spent 30 minutes in total face-to-face time with the patient, more than 50% of which was spent in counseling and coordination of care, reviewing test results, reviewing medication and discussing or reviewing the diagnosis of PD, its prognosis and treatment options. Pertinent laboratory and imaging test results that were available during this visit with the patient were reviewed by me and considered in my medical decision making (see chart for details).

## 2017-01-15 ENCOUNTER — Other Ambulatory Visit: Payer: Self-pay | Admitting: Neurology

## 2017-01-15 DIAGNOSIS — G20 Parkinson's disease: Secondary | ICD-10-CM

## 2017-01-28 ENCOUNTER — Telehealth: Payer: Self-pay | Admitting: Neurology

## 2017-01-28 DIAGNOSIS — G20 Parkinson's disease: Secondary | ICD-10-CM

## 2017-01-28 MED ORDER — RASAGILINE MESYLATE 1 MG PO TABS: 1.0000 mg | ORAL_TABLET | Freq: Every day | ORAL | 3 refills | Status: DC

## 2017-01-28 MED ORDER — CARBIDOPA-LEVODOPA 25-100 MG PO TABS: 1.5000 | ORAL_TABLET | Freq: Four times a day (QID) | ORAL | 3 refills | Status: DC

## 2017-01-28 NOTE — Telephone Encounter (Signed)
Pt calling for a refill of carbidopa-levodopa (SINEMET IR) 25-100 MG tablet  rasagiline (AZILECT) 1 MG TABS tablet  [facility] [place] - [id number] AT [address] [facility] [employer] [facility] [phone] (Phone) [phone] (Fax)

## 2017-01-28 NOTE — Telephone Encounter (Signed)
I called pt. I advised him that his sinemet was refilled in May of 2018 for a year's supply. His azilect is due for a refill. Pt should not need a refill on his sinemet. Pt says that since he did not get the May 2018 RX sinemet filled, [facility] said that the RX is expired now. I spoke with Dr. Frances FurbishAthar and she is agreeable to refilling the sinemet. RXs for sinemet and azilect sent to [facility].

## 2017-01-28 NOTE — Addendum Note (Signed)
Addended by: Geronimo RunningINKINS,  A on: 01/28/2017 03:55 PM   Modules accepted: Orders

## 2017-03-14 HISTORY — PX: OTHER SURGICAL HISTORY: SHX169

## 2017-05-06 ENCOUNTER — Encounter: Payer: Self-pay | Admitting: Neurology

## 2017-05-06 ENCOUNTER — Ambulatory Visit (INDEPENDENT_AMBULATORY_CARE_PROVIDER_SITE_OTHER): Payer: Managed Care, Other (non HMO) | Admitting: Neurology

## 2017-05-06 VITALS — BP 119/82 | HR 91 | Ht 74.5 in | Wt 266.5 lb

## 2017-05-06 DIAGNOSIS — Z9689 Presence of other specified functional implants: Secondary | ICD-10-CM

## 2017-05-06 DIAGNOSIS — G20 Parkinson's disease: Secondary | ICD-10-CM | POA: Diagnosis not present

## 2017-05-06 NOTE — Progress Notes (Signed)
Subjective:    [name] ID: [name] is a 62 y.o. male.  HPI     Interim history:   [name] is a 62 year old right-handed gentleman with an underlying history of hyperlipidemia and kidney stones, who presents for followup consultation of his right-sided predominant Parkinson's disease. [name] is accompanied by his wife again today. I last saw him on 11/05/2016, at which time [name] reported no significant repercussions after reducing Terry Neupro and increasing Terry Sinemet. His swelling had improved a little bit. [name] was still in Terry process of being evaluated for DBS surgery. I suggested we keep his medication regimen Terry same. We talked about Terry importance of losing weight especially in preparation for elective surgery.  Today, 05/06/2017: [name] reports doing better, walking and tremor are slightly better since his first programming appointment on 04/18/2017. Of note, [name] had his left DBS surgery on 04/04/2017 under Dr. Salomon Fick. [name] has had a very good experience overall from beginning to end, [name] is scheduled for follow-up appointment for his next programming on 05/13/2017 as well as follow-up with his surgeon. [name] has nothing but praise for Terry entire surgical and neurological team under Dr. Linus Mako which is very good to hear.   [name]'s allergies, current medications, family history, past medical history, past social history, past surgical history and problem list were reviewed and updated as appropriate.    Previously (copied from previous notes for reference):   I saw him on 07/04/2016, at which time [name] reported doing okay, was planning to pursue DBS surgery perhaps later this year. [name] was quite apprehensive. [name] had not scheduled his psychological evaluation or surgical consultation. [name] was overall considered a good candidate for DBS treatment per neurology. [name] was more on time with his Sinemet and noticed improvement in his symptoms, lower extremity swelling was slightly worse. Neupro was  working well at 8 mg, but d/t swelling, I suggested with reduce it. I also suggested we increase his Sinemet to 1-1/2 pills for times a day in lieu of keeping Terry Neupro at 8 mg daily. [name] was no longer taking gabapentin and his foot pain was indeed a little bit better with time, gabapentin made him too sleepy.     I saw him on 03/05/2016, at which time [name] reported taking C/L 4 times a day, but not always all 4 doses, as [name] would forget at times. Low-dose gabapentin prn was helping his foot pain. No recent falls, no recent mood or memory issues, no word on Terry referral from [facility]. Had a recent cold and congestion. [name] was advised to continue with Terry current management, including Sinemet 1 pill 4 times a day, Azilect 1 mg once daily, Neupro patch 8 mg once daily, gabapentin 300 mg at night as needed.   I resubmitted referral to [facility] for DBS Eval and [name] was seen in Terry interim by Dr. Linus Mako on 05/24/16. I reviewed Terry note.     I saw him on 12/04/2015, at which time [name] reported ongoing issues with his right foot including status post surgery in April 2017 for hammertoes but [name] had ongoing issues with pain and more stiffness. [name] felt overall that his right side was more stiff and tremulous. Since starting Sinemet [name] had noted no telltale difference in his symptoms. [name] had consulted with different podiatrists and had tried orthotics. Mood and memory were stable. [name] was not sleeping very well. We talked about DBS treatment in more detail. I made a referral to Dr. Linus Mako  at [facility]. I also increased his Sinemet to 1 pill 4 times a day. I suggested we try him on low-dose gabapentin for his right foot pain.   I saw him on 08/01/2015, at which time [name] reported feeling worse. [name] had more difficulty exercising, [name] had gained weight. [name] had foot surgery and his balance had been worse since then. [name] had hammertoe surgery on 05/25/2015. Because of decrease in mobility after his surgery his weight increase. His  tremor was worse. Memory was stable. I suggested we continue with Neupro 8 mg patch daily. I suggested [name] continue with Azilect once daily. I suggested we start him on Terry rytary 95 mg strength with gradual titration. However, [name] called in July reporting that Terry medication was too expensive, I therefore, switched him to Sinemet generic. [name] also requested a handicap placard form to be filled out which I provided in September.   Of note, [name] missed an appointment on 06/20/2015 due to being sick. I saw him on 12/01/2014, at which time [name] reported a change in his insurance, mood and memory were stable, lower extremity swelling was stable, we kept his Neupro patch at 8 mg an Azilect 1 mg once daily Terry same. [name] was working full-time, also active with his 2 teenage boys, 60 year old an 39 year old. His wife had a recent change in her job.    I saw him on 08/02/2014, at which time [name] reported doing well. [name] had some leg swelling. His right hand swelling was a little worse. Cognitively and mood wise [name] was stable. Balance may have been off at times but generally speaking [name] was doing well.    I saw him on 04/05/2014, at which time [name] reported feeling fairly stable but his wife felt his balance was not as good. [name] was working out with a Clinical research associate. [name] had picked up boxing. [name] was working full-time. [name] cut back on his sodas. I kept him on his medications, Neupro patch 6 mg and Azilect 1 mg. [name] had some ongoing issues with lower extremity swelling and I referred him to vascular surgery for consultation. [name] was seen by a vascular specialist, Dr. Scot Dock on 04/13/2014. [name] had Doppler studies to his lower extremities and was advised [name] had no DVT and good arterial flow. His swelling improved.    I saw him on 12/03/2013, at which time [name] reported doing well and had noted a benefit from Neupro patch 6 mg. I continued him on this dose as well as Azilect 1 mg once daily. His exam was stable.   I saw him on 07/28/2013, at which  time [name] reported some worsening of his tremor and his gait. [name] was able to tolerate neupro patch 4 mg strength. [name] has had some skin irritation, most likely from Terry adhesive. [name] was not exercising regularly but endorsed being active and working full-time. His memory was stable. [name] denies any impulse control disorder but had mild daytime somnolence which was not as severe as when [name] was on Requip long-acting.     I saw him on 02/05/2013, at which time I felt his physical exam was a little worse since stopping Requip XL. [name] had stopped this because of daytime somnolence and severe nausea. I suggested we start him on Neupro patch. I provided him with samples. We talked about doing a sleep study down Terry [place].     I first met him on 07/15/2012, at which time I suggested [name] start taking coenzyme Q 10. I  also suggested that [name] switch his Requip XL 4 mg to nighttime because of report of daytime somnolence. I did not increase make any other changes to his medications and felt that [name] was overall stable.     [name] previously followed with Dr. Jeneen Rinks love and was last seen by him on 03/17/2012 at which time Dr. Erling Cruz increase his Requip XL and continued him on Azilect. [name] was diagnosed in 12/2008, and Sx go back to a year prior to that.   MRI brain without contrast was done in Terry past. [name] has been on rasagiline, which improved his tremor. 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 There is no family history of tremor. There is no history of REM behavior disorder. [name] has had no falls, or hallucinations or involuntary movements otherwise. [name] exercises not very regularly. His memory and mood have been stable. [name] has no problems with lower extremities swelling or compulsive thoughts or gambling. 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    His Past Medical History Is Significant For: Past Medical History:  Diagnosis Date  . Calculus of kidney   . Lesion of ulnar nerve   .  Paralysis agitans ([place])   . Parkinson's disease ([place])   . Sleep disturbance, unspecified     His Past Surgical History Is Significant For: Past Surgical History:  Procedure Laterality Date  . HAMMERTOE RECONSTRUCTION WITH WEIL OSTEOTOMY Right 05/25/2015   Procedure: RIGHT SECOND TOE METATARSAL WEIL OSTEOTOMY  HAMMERTOE CORRECTION COLLATERAL LIGAMENT REPAIR; FLEXOR TO EXTENSOR TRANSFER ;  Surgeon: Wylene Simmer, MD;  Location: [facility];  Service: Orthopedics;  Laterality: Right;  . kidney stones  1991    His Family History Is Significant For: Family History  Problem Relation Age of Onset  . Heart failure Father   . Cancer Father   . Diabetes Father   . Sleep apnea Mother   . Sleep apnea Brother     His Social History Is Significant For: Social History   Socioeconomic History  . Marital status: Married    Spouse name: Not on file  . Number of children: Not on file  . Years of education: Not on file  . Highest education level: Not on file  Occupational History  . Not on file  Social Needs  . Financial resource strain: Not on file  . Food insecurity:    Worry: Not on file    Inability: Not on file  . Transportation needs:    Medical: Not on file    Non-medical: Not on file  Tobacco Use  . Smoking status: Never Smoker  . Smokeless tobacco: Never Used  Substance and Sexual Activity  . Alcohol use: Yes    Alcohol/week: 1.8 oz    Types: 3 Standard drinks or equivalent per week    Comment: social  . Drug use: No  . Sexual activity: Not on file  Lifestyle  . Physical activity:    Days per week: Not on file    Minutes per session: Not on file  . Stress: Not on file  Relationships  . Social connections:    Talks on phone: Not on file    Gets together: Not on file    Attends religious service: Not on file    Active member of club or organization: Not on file    Attends meetings of clubs or organizations: Not on file    Relationship status: Not on file  Other Topics Concern  . Not on file  Social History Narrative   Consumes 1 soda a day     His Allergies Are:  No Known Allergies:   His Current Medications Are:  Outpatient Encounter Medications as of 05/06/2017  Medication Sig  . carbidopa-levodopa (SINEMET IR) 25-100 MG tablet Take 1.5 tablets by mouth 4 (four) times daily. At 8, 12, 4 PM and 8 PM  . rasagiline (AZILECT) 1 MG TABS tablet Take 1 tablet (1 mg total) by mouth daily.  . rotigotine (NEUPRO) 6 MG/24HR Place 1 patch onto Terry skin daily.   No facility-administered encounter medications on file as of 05/06/2017.   :  Review of Systems:  Out of a complete 14 point review of systems, all are reviewed and negative with Terry exception of these symptoms as listed below: Review of Systems  Neurological:       [name] doing well. [name] had brain surgery since his last visit here.    Objective:  Neurological Exam  Physical Exam Physical Examination:   Vitals:   05/06/17 0916  BP: 119/82  Pulse: 91   General Examination: [name] is a very pleasant 62 y.o. male in no acute distress. [name] appears well-developed and well-nourished and well groomed.   HEENT:Normocephalic, unremarkable scar left parietal skull/scalp. Pupils are equal, round and reactive to light and accommodation. Extraocular tracking shows mild saccadic breakdown without nystagmus noted. There is no limitation to his gaze. There is mild decrease in eye blink rate. Hearing is intact. Face is symmetric with moderatefacial masking and normal facial sensation. There is no lip, neck or jaw tremor. Oropharynx exam reveals  no new findings. There is no sialorrhea.    Chest:is clear to auscultation without wheezing, rhonchi or crackles noted.  Heart:sounds are regular and normal without murmurs, rubs or gallops noted.   Abdomen:is soft, non-tender and non-distended with normal bowel sounds appreciated on auscultation.  Extremities:There 1+pitting edema in  Terry distal lower extremities bilaterally.   Skin: is warm and dry with no trophic changes noted.  Musculoskeletal: exam reveals no new findings. (status post right second toe hammertoe repair).  Neurologically:  Mental status: [name] is awake and alert, paying good attention. [name] is able to completely provide Terry history. [name] is oriented to: person, place, time/date, situation, day of week, month of year and year. His memory, attention, language and knowledge are intact. There is no aphasia, agnosia, apraxia or anomia. There is a no bradyphrenia. Speech is mildly hypophonic with no dysarthria noted. Mood is congruent and affect is normal.   Cranial nerves are as described above under HEENT exam. In addition, shoulder shrug is normal, but right shoulder is a little higher than left shoulder, stable findings.  Motor exam: Normal bulk, and strength for age is noted. There are no dyskinesias noted.  Tone is mild to moderately rigid with presence of cogwheeling in Terry right upper extremity and RLE. There is overall moderate bradykinesia. There is no drift or rebound.  There is an intermittent mild to moderate resting tremor in Terry right upper extremity and mild and intermittent on Terry LUE.   Reflexes are 1+ in Terry upper extremities and 1+ in Terry knees, absent in Terry ankles.   Fine motor skills exam:  moderate impairment noted on Terry right, mild to moderate impairment on Terry left.   Cerebellar testing shows no dysmetria or intention tremor on finger to nose testing. Heel to shin is unremarkable bilaterally. There is no truncal or  gait ataxia.   Sensory exam is intact to light touch in both upper and lower extremities.  Gait, station and balance: [name] stands up from Terry seated position with no significant difficulty but does need to push up with His hands. [name] needs no assistance. No veering to one side is noted. [name] is noted to lean slightly to Terry R side, but overall his posture is better,  [name] is able to stand slightly more narrow based and his walking appears better. Turns okay.   Assessment and Plan:   In summary, [name] is a very pleasant 62 year old male with a history of Parkinson's disease, right-sided predominant, diagnosed in 2010 with symptoms going back to 2009 most likely. [name] had no telltale history of RBD, had some vivid dreams in Terry recent past. lower extremity swelling is slightly better, we reduced his Neupro from 8 mg to 6 mg in May 2018 as well as increased his Sinemet to 1-1/2 pills 4 times a day from 1 pill 4 times a day at Terry time. [name] is now status post left DBS placement on 04/04/2017. [name] has already made progress, after his first programming session earlier this month [name] has actually noticed some improvement and felt really well Terry first week after Terry programming. [name] does get more tremor when [name] is nervous which is understandable. [name] has a follow-up appointment coming up soon for surgical follow-up programming follow-up. [name] is advised to keep all his appointments at [facility] and we will not be too eager to make any changes in his medication management quite yet, we will wait things out and I will be guided by how [name] is doing with Dr. Linus Mako and his team. I suggested a 6 month follow-up, sooner if needed. I answered all his questions today and [name] was in agreement. I am very pleased to see him do better. [name] has had a very favorable experience at [facility] overall from start to finish and I'm glad to hear that [name] has nothing but praise for Terry DBS team. Of note, [name] had a jury summons for 06/18/2017. I provided a letter of support to excuse him from Terry jury summons and any future summons.  I spent 25 minutes in total face-to-face time with [name], more than 50% of which was spent in counseling and coordination of care, reviewing test results, reviewing medication and discussing or reviewing Terry diagnosis of PD, its prognosis and treatment options.  Pertinent laboratory and imaging test results that were available during this visit with [name] were reviewed by me and considered in my medical decision making (see chart for details).

## 2017-05-06 NOTE — Patient Instructions (Signed)
I am glad to see you have done well after the left DBS. I am excited for you. Please keep all [facility].  We will not make any changes in your meds too quickly and I will be guided by Dr. Rubin PayorSiddiqui in that regard.  You look better! I will see you back in 6 months.

## 2017-08-28 ENCOUNTER — Telehealth: Payer: Self-pay | Admitting: Neurology

## 2017-08-28 MED ORDER — ROTIGOTINE 6 MG/24HR TD PT24: 1.0000 | MEDICATED_PATCH | Freq: Every day | TRANSDERMAL | 0 refills | Status: DC

## 2017-08-28 NOTE — Telephone Encounter (Signed)
I called pt, advised him that his neupro patch has been sent to [facility] on [address]. Pt verbalized understanding.

## 2017-08-28 NOTE — Telephone Encounter (Signed)
I can't find any documentation from our office where we have declined to refill the neupro patch. Pt is due for a refill. Refill completed and sent to [facility] on [address], per request.

## 2017-08-28 NOTE — Telephone Encounter (Signed)
Pt is asking for a refill on his rotigotine (NEUPRO) 6 MG/24HR, he states he has been told by  [facility] [address] [place]- [place], [place] - [address] [phone] (Phone) [phone] (Fax)     That Dr Frances FurbishAthar declined to refill,  Pt is asking for a call to explained why it was denied.  Pt has a 6 mo f/u scheduled for 09-26.  Please call

## 2017-08-28 NOTE — Addendum Note (Signed)
Addended by: Geronimo RunningINKINS,  A on: 08/28/2017 09:14 AM   Modules accepted: Orders

## 2017-11-06 ENCOUNTER — Encounter: Payer: Self-pay | Admitting: Neurology

## 2017-11-06 ENCOUNTER — Ambulatory Visit (INDEPENDENT_AMBULATORY_CARE_PROVIDER_SITE_OTHER): Payer: Managed Care, Other (non HMO) | Admitting: Neurology

## 2017-11-06 VITALS — BP 119/79 | HR 97 | Ht 74.0 in | Wt 282.5 lb

## 2017-11-06 DIAGNOSIS — E669 Obesity, unspecified: Secondary | ICD-10-CM | POA: Diagnosis not present

## 2017-11-06 DIAGNOSIS — G20 Parkinson's disease: Secondary | ICD-10-CM

## 2017-11-06 DIAGNOSIS — G4719 Other hypersomnia: Secondary | ICD-10-CM

## 2017-11-06 DIAGNOSIS — R351 Nocturia: Secondary | ICD-10-CM

## 2017-11-06 DIAGNOSIS — Z820 Family history of epilepsy and other diseases of the nervous system: Secondary | ICD-10-CM

## 2017-11-06 DIAGNOSIS — R0683 Snoring: Secondary | ICD-10-CM

## 2017-11-06 DIAGNOSIS — G20A1 Parkinson's disease without dyskinesia, without mention of fluctuations: Secondary | ICD-10-CM

## 2017-11-06 MED ORDER — RASAGILINE MESYLATE 1 MG PO TABS: 1.0000 mg | ORAL_TABLET | Freq: Every day | ORAL | 3 refills | Status: DC

## 2017-11-06 MED ORDER — CARBIDOPA-LEVODOPA 25-100 MG PO TABS: 1.5000 | ORAL_TABLET | Freq: Four times a day (QID) | ORAL | 3 refills | Status: DC

## 2017-11-06 MED ORDER — ROTIGOTINE 6 MG/24HR TD PT24: 1.0000 | MEDICATED_PATCH | Freq: Every day | TRANSDERMAL | 3 refills | Status: DC

## 2017-11-06 NOTE — Progress Notes (Signed)
Subjective:    Patient ID: [name] is a 62 y.o. male.  HPI     Interim history:   [name] is a 62 year old right-handed gentleman with an underlying history of hyperlipidemia and kidney stones, who presents for followup consultation of his right-sided predominant Parkinson's disease. He is unaccompanied today. I last saw him on 05/06/2017, at which time he was doing well, walking and tremor were since his first programming appointment on 04/18/2017. Of note, he had his left DBS surgery on 04/04/2017 under Dr. Salomon Fick. He has had a very good experience overall from beginning to end.   Today, 11/06/2017: He reports doing well on the feeling stable. Had his appointment with Dr. Linus Mako at the end of August, programming settings were kept the same. He has been on the same medication regimen, Sinemet 1.5 pills 4 times a day, Azilect in the morning and Neupro patch 6 mg at night. He has noticed worsening lower extremity swelling. He admits that he has gained weight and does not exercise on a day-to-day basis. He uses his stationary bike about twice per week. He does not mind walking but cannot walk longer distance because of right foot pain. He had surgery to his right foot. He has found some better fitting shoes and hip pain improved after he found new shoes. Older son in [place] at [employer] state, younger one is senior in [place]. Wife works Medical laboratory scientific officer, travels some for work, in [place] currently. He is not sleeping very well at night. He snores, he has frequent nighttime awakenings. He has nocturia about twice per average night. His mom and his older brother have sleep apnea and uses CPAP machines. He would be willing to get tested for sleep apnea and consider CPAP therapy. His Epworth sleepiness score is 17 out of 24 today.   The patient's allergies, current medications, family history, past medical history, past social history, past surgical history and problem list were reviewed and updated as appropriate.     Previously (copied from previous notes for reference):   I saw him on 11/05/2016, at which time he reported no significant repercussions after reducing the Neupro and increasing the Sinemet. His swelling had improved a little bit. He was still in the process of being evaluated for DBS surgery. I suggested we keep his medication regimen the same. We talked about the importance of losing weight especially in preparation for elective surgery.     I saw him on 07/04/2016, at which time he reported doing okay, was planning to pursue DBS surgery perhaps later this year. He was quite apprehensive. He had not scheduled his psychological evaluation or surgical consultation. He was overall considered a good candidate for DBS treatment per neurology. He was more on time with his Sinemet and noticed improvement in his symptoms, lower extremity swelling was slightly worse. Neupro was working well at 8 mg, but d/t swelling, I suggested with reduce it. I also suggested we increase his Sinemet to 1-1/2 pills for times a day in lieu of keeping the Neupro at 8 mg daily. He was no longer taking gabapentin and his foot pain was indeed a little bit better with time, gabapentin made him too sleepy.    I saw him on 03/05/2016, at which time he reported taking C/L 4 times a day, but not always all 4 doses, as he would forget at times. Low-dose gabapentin prn was helping his foot pain. No recent falls, no recent mood or memory issues, no word on the  referral from [facility] Had a recent cold and congestion. He was advised to continue with the current management, including Sinemet 1 pill 4 times a day, Azilect 1 mg once daily, Neupro patch 8 mg once daily, gabapentin 300 mg at night as needed.   I resubmitted referral to [facility] for DBS Eval and patient was seen in the interim by Dr. Linus Mako on 05/24/16. I reviewed the note.     I saw him on 12/04/2015, at which time he reported ongoing issues with his right foot including status post  surgery in April 2017 for hammertoes but he had ongoing issues with pain and more stiffness. He felt overall that his right side was more stiff and tremulous. Since starting Sinemet he had noted no telltale difference in his symptoms. He had consulted with different podiatrists and had tried orthotics. Mood and memory were stable. He was not sleeping very well. We talked about DBS treatment in more detail. I made a referral to Dr. [facility]us [facility] at [facility]. I also increased his Sinemet to 1 pill 4 times a day. I suggested we try him on low-dose gabapentin for his right foot pain.   I saw him on 08/01/2015, at which time he reported feeling worse. He had more difficulty exercising, he had gained weight. He had foot surgery and his balance had been worse since then. He had hammertoe surgery on 05/25/2015. Because of decrease in mobility after his surgery his weight increase. His tremor was worse. Memory was stable. I suggested we continue with Neupro 8 mg patch daily. I suggested he continue with Azilect once daily. I suggested we start him on the rytary 95 mg strength with gradual titration. However, he called in July reporting that the medication was too expensive, I therefore, switched him to Sinemet generic. He also requested a handicap placard form to be filled out which I provided in September.   Of note, he missed an appointment on 06/20/2015 due to being sick. I saw him on 12/01/2014, at which time he reported a change in his insurance, mood and memory were stable, lower extremity swelling was stable, we kept his Neupro patch at 8 mg an Azilect 1 mg once daily the same. He was working full-time, also active with his 2 teenage boys, 60 year old an 49 year old. His wife had a recent change in her job.    I saw him on 08/02/2014, at which time he reported doing well. He had some leg swelling. His right hand swelling was a little worse. Cognitively and mood wise he was stable. Balance may have been off at  times but generally speaking he was doing well.    I saw him on 04/05/2014, at which time he reported feeling fairly stable but his wife felt his balance was not as good. He was working out with a Clinical research associate. He had picked up boxing. He was working full-time. He cut back on his sodas. I kept him on his medications, Neupro patch 6 mg and Azilect 1 mg. He had some ongoing issues with lower extremity swelling and I referred him to vascular surgery for consultation. He was seen by a vascular specialist, Dr. Scot Dock on 04/13/2014. He had Doppler studies to his lower extremities and was advised he had no DVT and good arterial flow. His swelling improved.    I saw him on 12/03/2013, at which time he reported doing well and had noted a benefit from Neupro patch 6 mg. I continued him on this dose as well as Azilect  1 mg once daily. His exam was stable.   I saw him on 07/28/2013, at which time he reported some worsening of his tremor and his gait. He was able to tolerate neupro patch 4 mg strength. He has had some skin irritation, most likely from the adhesive. He was not exercising regularly but endorsed being active and working full-time. His memory was stable. He denies any impulse control disorder but had mild daytime somnolence which was not as severe as when he was on Requip long-acting.     I saw him on 02/05/2013, at which time I felt his physical exam was a little worse since stopping Requip XL. He had stopped this because of daytime somnolence and severe nausea. I suggested we start him on Neupro patch. I provided him with samples. We talked about doing a sleep study down the [place].     I first met him on 07/15/2012, at which time I suggested he start taking coenzyme Q 10. I also suggested that he switch his Requip XL 4 mg to nighttime because of report of daytime somnolence. I did not increase make any other changes to his medications and felt that he was overall stable.     He previously followed with Dr.  Jeneen Rinks love and was last seen by him on 03/17/2012 at which time Dr. Erling Cruz increase his Requip XL and continued him on Azilect. He was diagnosed in 12/2008, and Sx go back to a year prior to that.   MRI brain without contrast was done in the past. He has been on rasagiline, which improved his tremor. 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 There is no family history of tremor. There is no history of REM behavior disorder. He has had no falls, or hallucinations or involuntary movements otherwise. He exercises not very regularly. His memory and mood have been stable. He has no problems with lower extremities swelling or compulsive thoughts or gambling. He works full-time as a Engineer, site.     His Past Medical History Is Significant For: Past Medical History:  Diagnosis Date  . Calculus of kidney   . Lesion of ulnar nerve   . Paralysis agitans ([place])   . Parkinson's disease ([place])   . Sleep disturbance, unspecified     His Past Surgical History Is Significant For: Past Surgical History:  Procedure Laterality Date  . HAMMERTOE RECONSTRUCTION WITH WEIL OSTEOTOMY Right 05/25/2015   Procedure: RIGHT SECOND TOE METATARSAL WEIL OSTEOTOMY  HAMMERTOE CORRECTION COLLATERAL LIGAMENT REPAIR; FLEXOR TO EXTENSOR TRANSFER ;  Surgeon: Wylene Simmer, MD;  Location: [facility];  Service: Orthopedics;  Laterality: Right;  . kidney stones  1991    Her Family History Is Significant For: Family History  Problem Relation Age of Onset  . Heart failure Father   . Cancer Father   . Diabetes Father   . Sleep apnea Mother   . Sleep apnea Brother     His Social History Is Significant For: Social History   Socioeconomic History  . Marital status: Married    Spouse name: Not on file  . Number of children: Not on file  . Years of education: Not on file  . Highest education level: Not on file  Occupational History  . Not on file  Social Needs  .  Financial resource strain: Not on file  . Food insecurity:    Worry: Not on file    Inability: Not on file  .  Transportation needs:    Medical: Not on file    Non-medical: Not on file  Tobacco Use  . Smoking status: Never Smoker  . Smokeless tobacco: Never Used  Substance and Sexual Activity  . Alcohol use: Yes    Alcohol/week: 3.0 standard drinks    Types: 3 Standard drinks or equivalent per week    Comment: social  . Drug use: No  . Sexual activity: Not on file  Lifestyle  . Physical activity:    Days per week: Not on file    Minutes per session: Not on file  . Stress: Not on file  Relationships  . Social connections:    Talks on phone: Not on file    Gets together: Not on file    Attends religious service: Not on file    Active member of club or organization: Not on file    Attends meetings of clubs or organizations: Not on file    Relationship status: Not on file  Other Topics Concern  . Not on file  Social History Narrative   Consumes 1 soda a day     His Allergies Are:  No Known Allergies:   His Current Medications Are:  Outpatient Encounter Medications as of 11/06/2017  Medication Sig  . carbidopa-levodopa (SINEMET IR) 25-100 MG tablet Take 1.5 tablets by mouth 4 (four) times daily. At 8, 12, 4 PM and 8 PM  . rasagiline (AZILECT) 1 MG TABS tablet Take 1 tablet (1 mg total) by mouth daily.  . rotigotine (NEUPRO) 6 MG/24HR Place 1 patch onto the skin daily.   No facility-administered encounter medications on file as of 11/06/2017.   :  Review of Systems:  Out of a complete 14 point review of systems, all are reviewed and negative with the exception of these symptoms as listed below:  Review of Systems  Neurological:       Patient stated that he hasn't had any falls. No concerns or changes at this time.     Objective:  Neurological Exam  Physical Exam Physical Examination:   Vitals:   11/06/17 0939  BP: 119/79  Pulse: 97    General Examination:  The patient is a very pleasant 62 y.o. male in no acute distress. He appears well-developed and well-nourished and well groomed. He has gained weight. Nearly 20 pounds in the past year.  HEENT:Normocephalic, unremarkable scar left parietal skull/scalp. Pupils are equal, round and reactive to light and accommodation. Extraocular tracking shows mild saccadic breakdown without nystagmus noted. There is no limitation to his gaze. There is mild decrease in eye blink rate. Hearing is intact. Face is symmetric with moderatefacial masking and normal facial sensation. There is no lip, neck or jaw tremor. Oropharynx exam reveals moderate airway crowding, neck circumference of 17-3/4 inches. There is no sialorrhea.   Chest:is clear to auscultation without wheezing, rhonchi or crackles noted.  Heart:sounds are regular and normal without murmurs, rubs or gallops noted.   Abdomen:is soft, non-tender and non-distended with normal bowel sounds appreciated on auscultation.  Extremities:There 2+ pitting edema in the distal lower extremities bilaterally.   Skin: is warm and dry with no trophic changes noted. Chronic swelling type changes and mild redness in both distal lower extremities.  Musculoskeletal: exam reveals nonew findings. (status post right second toe hammertoe repair).  Neurologically:  Mental status: The patient is awake and alert, paying good attention. He is able to completely provide the history. He is oriented to: person, place, time/date, situation, day of  week, month of year and year. His memory, attention, language and knowledge are intact. There is no aphasia, agnosia, apraxia or anomia. There is a no bradyphrenia. Speech is mildly hypophonic with no dysarthria noted. Mood is congruent and affect is normal.   Cranial nerves are as described above under HEENT exam. In addition, shoulder shrug is normal, but right shoulder is a little higher than left shoulder, stable  findings.  Motor exam: Normal bulk, and strength for age is noted. There are no dyskinesias noted.  Tone is mild to moderately rigid with presence of cogwheeling in the right upper extremity and RLE. There is overall moderate bradykinesia. There is no drift or rebound.  There is an intermittent mild to moderate resting tremor in the right upper extremity and a slight and intermittent on the LUE.   Reflexes are 1+ in the upper extremities and 1+ in the knees, absent in the ankles.   Fine motor skills exam:moderate impairment noted on the right, mild to moderate impairment on the left.   Cerebellar testing shows no dysmetria or intention tremor on finger to nose testing. Heel to shin is unremarkable bilaterally. There is no truncal or gait ataxia.   Sensory exam is intact to light touch in both upper and lower extremities.  Gait, station and balance: He stands up from the seated position with no significant difficulty but does need to push up with His hands. He needs no assistance. No veering to one side is noted. Posture fairly good today, walks with mild limp on the R.    Assessment and Plan:   In summary, [name] is a very pleasant 62 year old male with a history of Parkinson's disease, right-sided predominant, diagnosed in 2010 with symptoms going back to 2009 most likely.He had no telltale history of RBD, hadsome vivid dreamsin therecentpast. He is status post left STN DBS placement in February of this year. He has done quite well after that. He continues to be on Neupro patch. We reduced the patch to 6 mg strength in May 2018 for lower extremity swelling. His swelling has actually become worse lately. He has also gained weight in the past year. He snores and has sleep disruption, and in light of weight gain is at risk for obstructive sleep apnea, particularly because he also has a family history of obstructive sleep apnea in mother and brother. He would be willing to get  tested with a sleep study and consider CPAP therapy. I ordered a sleep study. He will continue with Sinemet 1-1/2 pills 4 times a day and Azilect. We will continue also with the Neupro patch at 6 mg strength. Nevertheless, it is important to watch his swelling and to work on weight loss. We talked about the importance of exercise and increasing his exercise regimen and watching his calories. He is advised to start using compression stockings. I suggested a 6 month follow-up, sooner if needed. We will proceed with a sleep study hopefully soon. We will keep them posted as to his sleep study results and consider CPAP therapy in the interim. I renewed his prescriptions today. I answered all his questions today and he was in agreement. I spent 25 minutes in total face-to-face time with the patient, more than 50% of which was spent in counseling and coordination of care, reviewing test results, reviewing medication and discussing or reviewing the diagnosis of PD, its prognosis and treatment options. Pertinent laboratory and imaging test results that were available during this visit with the  patient were reviewed by me and considered in my medical decision making (see chart for details).

## 2017-11-06 NOTE — Patient Instructions (Signed)
Please watch calorie intake, track with an app, like My Fitness Pal.  Please monitor your swelling. You may want to start using knee high compression socks.  Please drink more water.  We will do a sleep study to rule out sleep apnea.  I will see you back in 6 months. We will keep the meds the same.

## 2017-11-06 NOTE — Progress Notes (Signed)
Epworth Sleepiness Scale 0= would never doze 1= slight chance of dozing 2= moderate chance of dozing 3= high chance of dozing  Sitting and reading: 3 Watching TV: 2 Sitting inactive in a public place (ex. Theater or meeting): 2 As a passenger in a car for an hour without a break: 3 Lying down to rest in the afternoon: 2 Sitting and talking to someone: 2 Sitting quietly after lunch (no alcohol): 2 In a car, while stopped in traffic: 1 Total: 17

## 2017-12-01 ENCOUNTER — Telehealth: Payer: Self-pay

## 2017-12-01 NOTE — Telephone Encounter (Signed)
Insurance has denied in lab sleep study request. Do you want to order a HST? 

## 2017-12-02 ENCOUNTER — Encounter: Payer: Self-pay | Admitting: Neurology

## 2017-12-02 ENCOUNTER — Other Ambulatory Visit: Payer: Self-pay | Admitting: Neurology

## 2017-12-02 DIAGNOSIS — R351 Nocturia: Secondary | ICD-10-CM

## 2017-12-02 DIAGNOSIS — E669 Obesity, unspecified: Secondary | ICD-10-CM

## 2017-12-02 DIAGNOSIS — R0683 Snoring: Secondary | ICD-10-CM

## 2017-12-02 DIAGNOSIS — Z820 Family history of epilepsy and other diseases of the nervous system: Secondary | ICD-10-CM

## 2017-12-02 DIAGNOSIS — G20 Parkinson's disease: Secondary | ICD-10-CM

## 2017-12-02 DIAGNOSIS — G4719 Other hypersomnia: Secondary | ICD-10-CM

## 2017-12-02 NOTE — Telephone Encounter (Signed)
Order placed for the patient.

## 2017-12-22 ENCOUNTER — Ambulatory Visit (INDEPENDENT_AMBULATORY_CARE_PROVIDER_SITE_OTHER): Payer: Managed Care, Other (non HMO) | Admitting: Neurology

## 2017-12-22 DIAGNOSIS — R351 Nocturia: Secondary | ICD-10-CM

## 2017-12-22 DIAGNOSIS — G4733 Obstructive sleep apnea (adult) (pediatric): Secondary | ICD-10-CM | POA: Diagnosis not present

## 2017-12-22 DIAGNOSIS — G4719 Other hypersomnia: Secondary | ICD-10-CM

## 2017-12-22 DIAGNOSIS — G20A1 Parkinson's disease without dyskinesia, without mention of fluctuations: Secondary | ICD-10-CM

## 2017-12-22 DIAGNOSIS — Z820 Family history of epilepsy and other diseases of the nervous system: Secondary | ICD-10-CM

## 2017-12-22 DIAGNOSIS — G20 Parkinson's disease: Secondary | ICD-10-CM

## 2017-12-22 DIAGNOSIS — R0683 Snoring: Secondary | ICD-10-CM

## 2017-12-22 DIAGNOSIS — E669 Obesity, unspecified: Secondary | ICD-10-CM

## 2017-12-31 ENCOUNTER — Telehealth: Payer: Self-pay

## 2017-12-31 ENCOUNTER — Encounter: Payer: Self-pay | Admitting: Neurology

## 2017-12-31 NOTE — Telephone Encounter (Signed)
I called pt. I advised pt that Dr. Frances FurbishAthar reviewed their sleep study results and found that pt has severe osa. Dr. Frances FurbishAthar recommends that pt start an auto pap since pt's insurance will likely deny an in lab study. I reviewed PAP compliance expectations with the pt. Pt is agreeable to starting an auto-PAP. I advised pt that an order will be sent to a DME, [facility], and [facility] will call the pt within about one week after they file with the pt's insurance. [facility] will show the pt how to use the machine, fit for masks, and troubleshoot the auto-PAP if needed. A follow up appt was made for insurance purposes with Eber Jonesarolyn, NP on 03/31/18 at 8:45am. The appt in March with Dr. Frances FurbishAthar has been cancelled, and pt is agreeable to this. Pt verbalized understanding to arrive 15 minutes early and bring their auto-PAP. A letter with all of this information in it will be mailed to the pt as a reminder. I verified with the pt that the address we have on file is correct. Pt verbalized understanding of results. Pt had no questions at this time but was encouraged to call back if questions arise. I have sent the order to [facility] and have received confirmation that they have received the order.

## 2017-12-31 NOTE — Progress Notes (Signed)
Patient last seen by me on 11/06/17, HST on 12/23/17.    Please call and notify the patient that the recent home sleep test showed obstructive sleep apnea in the severe range. While I recommend treatment for this in the form CPAP, his insurance will not approve a sleep study for this. They will likely only approve a trial of autoPAP, which means, that we don't have to bring him in for a sleep study with CPAP, but will let him try an autoPAP machine at home, through a DME company (of his choice, or as per insurance requirement). The DME representative will educate him on how to use the machine, how to put the mask on, etc. I have placed an order in the chart. Please send referral, talk to patient, send report to referring MD. We will need a FU in sleep clinic for 10 weeks post-PAP set up, please arrange that with me or one of our NPs. Thanks,   Huston FoleySaima , MD, PhD [facility]

## 2017-12-31 NOTE — Telephone Encounter (Signed)
-----   Message from Huston FoleySaima Athar, MD sent at 12/31/2017  8:46 AM EST ----- Patient last seen by me on 11/06/17, HST on 12/23/17.    Please call and notify the patient that the recent home sleep test showed obstructive sleep apnea in the severe range. While I recommend treatment for this in the form CPAP, his insurance will not approve a sleep study for this. They will likely only approve a trial of autoPAP, which means, that we don't have to bring him in for a sleep study with CPAP, but will let him try an autoPAP machine at home, through a DME company (of his choice, or as per insurance requirement). The DME representative will educate him on how to use the machine, how to put the mask on, etc. I have placed an order in the chart. Please send referral, talk to patient, send report to referring MD. We will need a FU in sleep clinic for 10 weeks post-PAP set up, please arrange that with me or one of our NPs. Thanks,   Huston FoleySaima Athar, MD, PhD [facility]

## 2017-12-31 NOTE — Addendum Note (Signed)
Addended by: Huston FoleyATHAR,  on: 12/31/2017 08:46 AM   Modules accepted: Orders

## 2017-12-31 NOTE — Procedures (Signed)
[facility] [address] [place], [place] [address] NAME:  Ernest MallickJerry Vonderhaar                                                            DOB: January 03, 1956 MEDICAL RECORD NUMBER [id number]                                          DOS: 12/23/2017  REFERRING PHYSICIAN: Catha GosselinKevin Little, MD STUDY PERFORMED: Home Sleep Test on WatchPAT HISTORY: 62 year old man with a history of hyperlipidemia, Parkinson's, kidney stones and obesity, who reports snoring, frequent nighttime awakenings and nocturia. His Epworth sleepiness score is 17 out of 24, BMI of 36.2.   STUDY RESULTS:  Total Recording Time:  6 hours, 28 minutes Total Apnea/Hypopnea Index (AHI):   46.9/h, RDI: 47.3/h, no evidence of REM sleep Average Oxygen Saturation:  91%, Lowest Oxygen Desaturation: 84 %  Total Time Oxygen Saturation Below or at 88%: 13.9 minutes  Average Heart Rate:   72  bpm (between 40 and  104 bpm) IMPRESSION: Severe OSA RECOMMENDATION: This home sleep test demonstrates severe obstructive sleep apnea with a total AHI of 46.9/hour and O2 nadir of 84% (with absence of REM sleep per technical data). Given the patient's medical history and sleep related complaints, treatment with positive airway pressure (in the form of CPAP) is strongly recommended. Avoidance of the supine sleep position and weight loss should be pursued concomitantly. A full night CPAP titration study is recommended to determine proper treatment settings, accurate O2 monitoring and mask fitting. Based on the severity of the sleep disordered breathing an attended titration study is indicated. However, patient's insurance has denied an attended sleep study; therefore, the patient will be advised to proceed with an autoPAP titration/trial at home for now. Please note that untreated obstructive sleep apnea may carry additional perioperative morbidity. Patients with significant obstructive sleep apnea should receive perioperative PAP therapy and the  surgeons and particularly the anesthesiologist should be informed of the diagnosis and the severity of the sleep disordered breathing. The patient should be cautioned not to drive, work at heights, or operate dangerous or heavy equipment when tired or sleepy. Review and reiteration of good sleep hygiene measures should be pursued with any patient. Other causes of the patient's symptoms, including circadian rhythm disturbances, an underlying mood disorder, medication effect and/or an underlying medical problem cannot be ruled out based on this test. Clinical correlation is recommended. The patient and his referring provider will be notified of the test results. The [facility]. I certify that I have reviewed the raw data recording prior to the issuance of this report in accordance with the standards of the American Academy of Sleep Medicine (AASM).   Huston FoleySaima , MD, PhD [facility] Diplomat, ABPN (Neurology and Sleep)

## 2018-02-08 ENCOUNTER — Encounter: Payer: Self-pay | Admitting: Neurology

## 2018-02-16 ENCOUNTER — Telehealth: Payer: Self-pay | Admitting: Neurology

## 2018-02-16 MED ORDER — ROTIGOTINE 4 MG/24HR TD PT24: 1.0000 | MEDICATED_PATCH | Freq: Every day | TRANSDERMAL | 3 refills | Status: DC

## 2018-02-16 NOTE — Telephone Encounter (Signed)
Please see email conversation too.  Changing from 6 mg Neupro patch to 4 mg strength to see if leg swelling goes down. Rx sent to [facility], pt notified via email. Nothing needed, just FYI.

## 2018-03-30 ENCOUNTER — Encounter: Payer: Self-pay | Admitting: Neurology

## 2018-03-30 NOTE — Progress Notes (Addendum)
[facility]  PATIENT: [name] DOB: [birth year]   REASON FOR VISIT: Follow-up for Parkinson's disease , newly diagnosed severe obstructive sleep apnea  new AutoPap user HISTORY FROM: Patient     HISTORY OF PRESENT ILLNESS:UPDATE 2/18/2020CM [name], 63 year old male returns for follow-up with newly diagnosed severe obstructive sleep apnea here for initial AutoPap compliance.  Patient also has a history of Parkinson's disease.  He continues to see Dr. Westley HummerSiddiqi every 6 months alternating with Dr. Frances FurbishAthar.  He is currently on Sinemet 25 100 1-1/2 tabs 4 times daily, Azilect 1 mg daily and Neupro patch 4 mg daily.  His Parkinson symptoms are well controlled.  Patient claims he is still getting used to the AutoPap machine.  Compliance data dated March 01, 2018-03/30/2018 shows compliance greater than 4 hours at 70% or 21 days.  Less than 4 hours 13% or 4 days.  Usage days 25 out of 30 for 83%.  Average usage 5 hours 31 minutes.  Set pressure 7 to 13 cm.  EPR level 3.  Leak 95th percentile 7.1.  AHI 0.7 ESS 7 down from 17.  He returns for reevaluation    [name] is a 63 year old right-handed gentleman with an underlying history of hyperlipidemia and kidney stones, who presents for followup consultation of his right-sided predominant Parkinson's disease. He is unaccompanied today. I last saw him on 05/06/2017, at which time he was doing well, walking and tremor were since his first programming appointment on 04/18/2017. Of note, he had his left DBS surgery on 04/04/2017 under Dr. Angelyn Puntatter. He has had a very good experience overall from beginningto end.   Today, 11/06/2017: He reports doing well on the feeling stable. Had his appointment with Dr. Rubin PayorSiddiqui at the end of August, programming settings were kept the same. He has been on the same medication regimen, Sinemet 1.5 pills 4 times a day, Azilect in the morning and Neupro patch 6 mg at night. He has noticed worsening  lower extremity swelling. He admits that he has gained weight and does not exercise on a day-to-day basis. He uses his stationary bike about twice per week. He does not mind walking but cannot walk longer distance because of right foot pain. He had surgery to his right foot. He has found some better fitting shoes and hip pain improved after he found new shoes. Older son in [place] [employer], younger one is senior in HS. Wife works Teacher, English as a foreign languageT, travels some for work, in [place] currently. He is not sleeping very well at night. He snores, he has frequent nighttime awakenings. He has nocturia about twice per average night. His mom and his older brother have sleep apnea and uses CPAP machines. He would be willing to get tested for sleep apnea and consider CPAP therapy. His Epworth sleepiness score is 17 out of 24 today.   REVIEW OF SYSTEMS: Full 14 system review of systems performed and notable only for those listed, all others are neg:  Constitutional: neg  Cardiovascular: neg Ear/Nose/Throat: neg  Skin: neg Eyes: neg Respiratory: neg Gastroitestinal: neg  Hematology/Lymphatic: neg  Endocrine: neg Musculoskeletal: Parkinson's disease Allergy/Immunology: neg Neurological: neg Psychiatric: neg Sleep : Obstructive sleep apnea with AutoPap   ALLERGIES: No Known Allergies  HOME MEDICATIONS: Outpatient Medications Prior to Visit  Medication Sig Dispense Refill  . carbidopa-levodopa (SINEMET IR) 25-100 MG tablet Take 1.5 tablets by mouth 4 (four) times daily. At 8, 12, 4 PM and 8 PM 540 tablet 3  . rasagiline (  AZILECT) 1 MG TABS tablet Take 1 tablet (1 mg total) by mouth daily. 90 tablet 3  . rotigotine (NEUPRO) 4 MG/24HR Place 1 patch onto the skin daily. 90 patch 3   No facility-administered medications prior to visit.     PAST MEDICAL HISTORY: Past Medical History:  Diagnosis Date  . Calculus of kidney   . Lesion of ulnar nerve   . Paralysis agitans (HCC)   . Parkinson's disease (HCC)   . Sleep  disturbance, unspecified     PAST SURGICAL HISTORY: Past Surgical History:  Procedure Laterality Date  . HAMMERTOE RECONSTRUCTION WITH WEIL OSTEOTOMY Right 05/25/2015   Procedure: RIGHT SECOND TOE METATARSAL WEIL OSTEOTOMY  HAMMERTOE CORRECTION COLLATERAL LIGAMENT REPAIR; FLEXOR TO EXTENSOR TRANSFER ;  Surgeon: Toni Arthurs, MD;  Location: [facility];  Service: Orthopedics;  Laterality: Right;  . kidney stones  1991    FAMILY HISTORY: Family History  Problem Relation Age of Onset  . Heart failure Father   . Cancer Father   . Diabetes Father   . Sleep apnea Mother   . Sleep apnea Brother     SOCIAL HISTORY: Social History   Socioeconomic History  . Marital status: Married    Spouse name: Not on file  . Number of children: Not on file  . Years of education: Not on file  . Highest education level: Not on file  Occupational History  . Not on file  Social Needs  . Financial resource strain: Not on file  . Food insecurity:    Worry: Not on file    Inability: Not on file  . Transportation needs:    Medical: Not on file    Non-medical: Not on file  Tobacco Use  . Smoking status: Never Smoker  . Smokeless tobacco: Never Used  Substance and Sexual Activity  . Alcohol use: Yes    Alcohol/week: 3.0 standard drinks    Types: 3 Standard drinks or equivalent per week    Comment: social  . Drug use: No  . Sexual activity: Not on file  Lifestyle  . Physical activity:    Days per week: Not on file    Minutes per session: Not on file  . Stress: Not on file  Relationships  . Social connections:    Talks on phone: Not on file    Gets together: Not on file    Attends religious service: Not on file    Active member of club or organization: Not on file    Attends meetings of clubs or organizations: Not on file    Relationship status: Not on file  . Intimate partner violence:    Fear of current or ex partner: Not on file    Emotionally abused: Not on file     Physically abused: Not on file    Forced sexual activity: Not on file  Other Topics Concern  . Not on file  Social History Narrative   Consumes 1 soda a day      PHYSICAL EXAM  Vitals:   03/31/18 0857  BP: 140/81  Pulse: 97  Weight: 288 lb (130.6 kg)  Height: 6\' 2"  (1.88 m)   Body mass index is 36.98 kg/m.  Generalized: Well developed, in no acute distress, well-groomed Head: normocephalic and atraumatic,. Oropharynx benign , scar left parietal skull From DBS Neck: Supple,  Musculoskeletal: No deformity  Skin 1+ edema at the ankles Neurological examination   Mentation: Alert oriented to time, place, history taking. Attention span  and concentration appropriate. Recent and remote memory intact.  Follows all commands  speech mildly hypophonic and language fluent.   Cranial nerve II-XII: Pupils were equal round reactive to light extraocular movements were full, visual field were full on confrontational test.  Decreased blink rate.  Facial sensation and strength were normal. hearing was intact to finger rubbing bilaterally. Uvula tongue midline. head turning and shoulder shrug were normal and symmetric.Tongue protrusion into cheek strength was normal. Motor: normal bulk and tone, full strength, intermittent mild resting tremor right upper extremity, cogwheeling right wrist, no dyskinesias noted Sensory: normal and symmetric to light touch, Coordination: finger-nose-finger, heel-to-shin bilaterally, no dysmetria Reflexes: 1+ in the upper extremities and knees absent at the ankles, plantar responses were flexor bilaterally. Gait and Station: Rising up from seated position without assistance, no veering to one side posture erect walks with a mild limp no assistive device no difficulty with turns.   DIAGNOSTIC DATA (LABS, IMAGING, TESTING) - I reviewed patient records, labs, notes, testing and imaging myself where available.    ASSESSMENT AND PLAN [name] is a very pleasant  63 year old male with a history of Parkinson's disease, right-sided predominant, diagnosed in 2010 with symptoms going back to 2009 most likely.He had no telltale history of RBD, hadsome vivid dreamsin therecentpast. He is status post left STN DBS placement in February 2019.  Sleep study in November 2019 showed severe obstructive sleep apnea.  He is here for AutoPap compliance.  Compliance data dated March 01, 2018-03/30/2018 shows compliance greater than 4 hours at 70% or 21 days.  Less than 4 hours 13% or 4 days.  Usage days 25 out of 30 for 83%.  Average usage 5 hours 31 minutes.  Set pressure 7 to 13 cm.  EPR level 3.  Leak 95th percentile 7.1.  AHI 0.7 ESS 7 down from 17.  He gets little exercise   PLAN: CPAP compliance 70% greater than 4 hours Continue same settings Need to increase the time used every night on the machine Recommended daily exercise by walking Continue Sinemet Azilect and Neupro up at current doses for Parkinson's disease which appears stable does not need refills Follow-up in 4 months for repeat compliance I spent 25 minutes in total face to face time with the patient more than 50% of which was spent counseling and coordination of care, reviewing test results reviewing medications and discussing and reviewing the diagnosis of severe obstructive sleep apnea and compliance with therapy.  Also discussed his Parkinson's disease and importance of daily exercise Nilda Riggs, [facility], [facility], APRN  [facility] [address] [place], [place] [address] [phone]  I reviewed the above note and documentation by the Nurse Practitioner and agree with the history, physical exam, assessment and plan as outlined above. I was immediately available for face-to-face consultation. Huston Foley, MD, PhD [facility])

## 2018-03-31 ENCOUNTER — Encounter: Payer: Self-pay | Admitting: Nurse Practitioner

## 2018-03-31 ENCOUNTER — Ambulatory Visit (INDEPENDENT_AMBULATORY_CARE_PROVIDER_SITE_OTHER): Payer: Managed Care, Other (non HMO) | Admitting: Nurse Practitioner

## 2018-03-31 VITALS — BP 140/81 | HR 97 | Ht 74.0 in | Wt 288.0 lb

## 2018-03-31 DIAGNOSIS — G20 Parkinson's disease: Secondary | ICD-10-CM | POA: Diagnosis not present

## 2018-03-31 DIAGNOSIS — Z9989 Dependence on other enabling machines and devices: Secondary | ICD-10-CM | POA: Diagnosis not present

## 2018-03-31 DIAGNOSIS — G4733 Obstructive sleep apnea (adult) (pediatric): Secondary | ICD-10-CM | POA: Diagnosis not present

## 2018-03-31 NOTE — Patient Instructions (Signed)
CPAP compliance 70% greater than 4 hours Continue same settings Continue Sinemet Azilect and Neupro up at current doses Follow-up in 4 months for repeat compliance

## 2018-04-29 ENCOUNTER — Ambulatory Visit: Payer: Managed Care, Other (non HMO) | Admitting: Neurology

## 2018-07-23 ENCOUNTER — Telehealth: Payer: Self-pay | Admitting: Neurology

## 2018-07-23 NOTE — Telephone Encounter (Signed)
Due to current COVID 19 pandemic, our office is severely reducing in office visits until further notice, in order to minimize the risk to our patients and healthcare providers.   Called patient to discuss a virtual visit for his 6/18 appt. No answer, LVM with my direct contact info for patient to call back to discuss.

## 2018-07-29 NOTE — Telephone Encounter (Signed)
Called patient again this morning and got in touch with him. Patient confirmed a virtual visit via mychart. I have sent patient the appt info via mychart.  Pt understands that although there may be some limitations with this type of visit, we will take all precautions to reduce any security or privacy concerns.  Pt understands that this will be treated like an in office visit and we will file with pt's insurance, and there may be a patient responsible charge related to this service.

## 2018-07-29 NOTE — Telephone Encounter (Signed)
I called pt. Pt's meds, allergies, and PMH were updated.  Pt reports his PD are cpap are stable.  I discussed with pt the mychart video process. Pt verbalized understanding.

## 2018-07-30 ENCOUNTER — Encounter: Payer: Self-pay | Admitting: Neurology

## 2018-07-30 ENCOUNTER — Ambulatory Visit: Payer: Managed Care, Other (non HMO) | Admitting: Neurology

## 2018-07-30 ENCOUNTER — Telehealth (INDEPENDENT_AMBULATORY_CARE_PROVIDER_SITE_OTHER): Payer: Managed Care, Other (non HMO) | Admitting: Neurology

## 2018-07-30 DIAGNOSIS — Z9989 Dependence on other enabling machines and devices: Secondary | ICD-10-CM

## 2018-07-30 DIAGNOSIS — Z9689 Presence of other specified functional implants: Secondary | ICD-10-CM

## 2018-07-30 DIAGNOSIS — G4733 Obstructive sleep apnea (adult) (pediatric): Secondary | ICD-10-CM

## 2018-07-30 DIAGNOSIS — G20 Parkinson's disease: Secondary | ICD-10-CM | POA: Diagnosis not present

## 2018-07-30 NOTE — Patient Instructions (Signed)
Given verbally, during today's virtual video-based encounter, with verbal feedback received.   

## 2018-07-30 NOTE — Progress Notes (Signed)
Interim history:  [name] is a 63 year old right-handed gentleman with an underlying history of hyperlipidemia and kidney stones, s/p DBS for PD, who presents for a virtual, video based appointment via my chart epic video visit for followup consultation of his right-sided predominant Parkinson's disease, as well as obstructive sleep apnea on AutoPap therapy.  The patient is accompanied by his wife for the first part (she had to leave for a video conference for their sons college orientation) today and joins from home, I am located in my office.  I last saw him on 11/06/2017, at which time he felt stable from the Parkinson's standpoint.  He had recently seen Dr. Linus Mako at [facility] in August and programming was kept the same.  His lower extremity swelling had gotten worse.    He emailed in December and in early January 2020 we reduced his Neupro patch from 6 mg to 4 mg daily.  He saw Cecille Rubin, nurse practitioner in the interim on 03/31/2018 for his first AutoPap visit after his home sleep test and he was adequate with his AutoPap compliance at the time.  Today, 07/30/18: Please also see below for virtual visit documentation.   I reviewed his AutoPap compliance data from 06/29/2018 through 07/28/2018 which is a total of 30 days, during which time he used his machine 26 days with percent used days greater than 4 hours at 77%, indicating good compliance with an average usage of 4 hours and 43 minutes only, residual AHI at goal at 1/h, 95th percentile of pressure at 8.3 cm, leak on the higher side with a 95th percentile at 18.3 L/min on a pressure range of 7 cm to 13 cm with EPR.    The patient's allergies, current medications, family history, past medical history, past social history, past surgical history and problem list were reviewed and updated as appropriate.    Previously (copied from previous notes for reference):   I saw him on 05/06/2017, at which time he was doing well, walking and  tremor were since his first programming appointment on 04/18/2017. Of note, he had his left DBS surgery on 04/04/2017 under Dr. Salomon Fick. He has had a very good experience overall from beginning to end.      I saw him on 11/05/2016, at which time he reported no significant repercussions after reducing the Neupro and increasing the Sinemet. His swelling had improved a little bit. He was still in the process of being evaluated for DBS surgery. I suggested we keep his medication regimen the same. We talked about the importance of losing weight especially in preparation for elective surgery.     I saw him on 07/04/2016, at which time he reported doing okay, was planning to pursue DBS surgery perhaps later this year. He was quite apprehensive. He had not scheduled his psychological evaluation or surgical consultation. He was overall considered a good candidate for DBS treatment per neurology. He was more on time with his Sinemet and noticed improvement in his symptoms, lower extremity swelling was slightly worse. Neupro was working well at 8 mg, but d/t swelling, I suggested with reduce it. I also suggested we increase his Sinemet to 1-1/2 pills for times a day in lieu of keeping the Neupro at 8 mg daily. He was no longer taking gabapentin and his foot pain was indeed a little bit better with time, gabapentin made him too sleepy.    I saw him on 03/05/2016, at which time he reported taking C/L 4  times a day, but not always all 4 doses, as he would forget at times. Low-dose gabapentin prn was helping his foot pain. No recent falls, no recent mood or memory issues, no word on the referral from [facility]. Had a recent cold and congestion. He was advised to continue with the current management, including Sinemet 1 pill 4 times a day, Azilect 1 mg once daily, Neupro patch 8 mg once daily, gabapentin 300 mg at night as needed.   I resubmitted referral to [facility] for DBS Eval and patient was seen in the interim by Dr. Linus Mako on  05/24/16. I reviewed the note.     I saw him on 12/04/2015, at which time he reported ongoing issues with his right foot including status post surgery in April 2017 for hammertoes but he had ongoing issues with pain and more stiffness. He felt overall that his right side was more stiff and tremulous. Since starting Sinemet he had noted no telltale difference in his symptoms. He had consulted with different podiatrists and had tried orthotics. Mood and memory were stable. He was not sleeping very well. We talked about DBS treatment in more detail. I made a referral to Dr. Linus Mako at [facility]. I also increased his Sinemet to 1 pill 4 times a day. I suggested we try him on low-dose gabapentin for his right foot pain.   I saw him on 08/01/2015, at which time he reported feeling worse. He had more difficulty exercising, he had gained weight. He had foot surgery and his balance had been worse since then. He had hammertoe surgery on 05/25/2015. Because of decrease in mobility after his surgery his weight increase. His tremor was worse. Memory was stable. I suggested we continue with Neupro 8 mg patch daily. I suggested he continue with Azilect once daily. I suggested we start him on the rytary 95 mg strength with gradual titration. However, he called in July reporting that the medication was too expensive, I therefore, switched him to Sinemet generic. He also requested a handicap placard form to be filled out which I provided in September.   Of note, he missed an appointment on 06/20/2015 due to being sick. I saw him on 12/01/2014, at which time he reported a change in his insurance, mood and memory were stable, lower extremity swelling was stable, we kept his Neupro patch at 8 mg an Azilect 1 mg once daily the same. He was working full-time, also active with his 2 teenage boys, 61 year old an 3 year old. His wife had a recent change in her job.    I saw him on 08/02/2014, at which time he reported doing well.  He had some leg swelling. His right hand swelling was a little worse. Cognitively and mood wise he was stable. Balance may have been off at times but generally speaking he was doing well.    I saw him on 04/05/2014, at which time he reported feeling fairly stable but his wife felt his balance was not as good. He was working out with a Clinical research associate. He had picked up boxing. He was working full-time. He cut back on his sodas. I kept him on his medications, Neupro patch 6 mg and Azilect 1 mg. He had some ongoing issues with lower extremity swelling and I referred him to vascular surgery for consultation. He was seen by a vascular specialist, Dr. Scot Dock on 04/13/2014. He had Doppler studies to his lower extremities and was advised he had no DVT and good arterial flow. His swelling  improved.    I saw him on 12/03/2013, at which time he reported doing well and had noted a benefit from Neupro patch 6 mg. I continued him on this dose as well as Azilect 1 mg once daily. His exam was stable.   I saw him on 07/28/2013, at which time he reported some worsening of his tremor and his gait. He was able to tolerate neupro patch 4 mg strength. He has had some skin irritation, most likely from the adhesive. He was not exercising regularly but endorsed being active and working full-time. His memory was stable. He denies any impulse control disorder but had mild daytime somnolence which was not as severe as when he was on Requip long-acting.     I saw him on 02/05/2013, at which time I felt his physical exam was a little worse since stopping Requip XL. He had stopped this because of daytime somnolence and severe nausea. I suggested we start him on Neupro patch. I provided him with samples. We talked about doing a sleep study down the [place].     I first met him on 07/15/2012, at which time I suggested he start taking coenzyme Q 10. I also suggested that he switch his Requip XL 4 mg to nighttime because of report of daytime  somnolence. I did not increase make any other changes to his medications and felt that he was overall stable.     He previously followed with Dr. Jeneen Rinks love and was last seen by him on 03/17/2012 at which time Dr. Erling Cruz increase his Requip XL and continued him on Azilect. He was diagnosed in 12/2008, and Sx go back to a year prior to that.   MRI brain without contrast was done in the past. He has been on rasagiline, which improved his tremor. 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 There is no family history of tremor. There is no history of REM behavior disorder. He has had no falls, or hallucinations or involuntary movements otherwise. He exercises not very regularly. His memory and mood have been stable. He has no problems with lower extremities swelling or compulsive thoughts or gambling. He works full-time as a Engineer, site.    His Past Medical History Is Significant For: Past Medical History:  Diagnosis Date   Calculus of kidney    Lesion of ulnar nerve    Paralysis agitans ([place])    Parkinson's disease (HCC)    Sleep disturbance, unspecified     His Past Surgical History Is Significant For: Past Surgical History:  Procedure Laterality Date   HAMMERTOE RECONSTRUCTION WITH WEIL OSTEOTOMY Right 05/25/2015   Procedure: RIGHT SECOND TOE METATARSAL WEIL OSTEOTOMY  HAMMERTOE CORRECTION COLLATERAL LIGAMENT REPAIR; FLEXOR TO EXTENSOR TRANSFER ;  Surgeon: Wylene Simmer, MD;  Location: [facility];  Service: Orthopedics;  Laterality: Right;   kidney stones  1991    His Family History Is Significant For: Family History  Problem Relation Age of Onset   Heart failure Father    Cancer Father    Diabetes Father    Sleep apnea Mother    Sleep apnea Brother     His Social History Is Significant For: Social History   Socioeconomic History   Marital status: Married    Spouse name: Not on file   Number of children:  Not on file   Years of education: Not on file   Highest education level: Not on file  Occupational  History   Not on file  Social Needs   Financial resource strain: Not on file   Food insecurity    Worry: Not on file    Inability: Not on file   Transportation needs    Medical: Not on file    Non-medical: Not on file  Tobacco Use   Smoking status: Never Smoker   Smokeless tobacco: Never Used  Substance and Sexual Activity   Alcohol use: Yes    Alcohol/week: 3.0 standard drinks    Types: 3 Standard drinks or equivalent per week    Comment: social   Drug use: No   Sexual activity: Not on file  Lifestyle   Physical activity    Days per week: Not on file    Minutes per session: Not on file   Stress: Not on file  Relationships   Social connections    Talks on phone: Not on file    Gets together: Not on file    Attends religious service: Not on file    Active member of club or organization: Not on file    Attends meetings of clubs or organizations: Not on file    Relationship status: Not on file  Other Topics Concern   Not on file  Social History Narrative   Consumes 1 soda a day     His Allergies Are:  No Known Allergies:   His Current Medications Are:  Outpatient Encounter Medications as of 07/30/2018  Medication Sig   carbidopa-levodopa (SINEMET IR) 25-100 MG tablet Take 1.5 tablets by mouth 4 (four) times daily. At 8, 12, 4 PM and 8 PM   rasagiline (AZILECT) 1 MG TABS tablet Take 1 tablet (1 mg total) by mouth daily.   rotigotine (NEUPRO) 4 MG/24HR Place 1 patch onto the skin daily.   No facility-administered encounter medications on file as of 07/30/2018.   :  Review of Systems:  Out of a complete 14 point review of systems, all are reviewed and negative with the exception of these symptoms as listed below:  Virtual Visit via Video Note on 07/30/2018: I connected with Balraj and his wife on 07/30/18 at  8:30 AM EDT by a video enabled telemedicine  application and verified that I am speaking with the correct person using two identifiers.   I discussed the limitations of evaluation and management by telemedicine and the availability of in person appointments. The patient expressed understanding and agreed to proceed.  History of Present Illness: He reports being compliant with AutoPap, he does not sleep very long, never really has slept 7 or 8 hours.  He does not feel a whole lot of difference; he is motivated to continue with treatment.  His swelling is better per wife, he has noted any repercussions from reducing the Neupro. His wife notices the occasional R foot involuntary movements, including tapping, little different from his tremor typically and coinciding with when it is time for his next dose.  I explained to them that wearing off dyskinesias are not unusual and are typically in the lower extremities.  He had hammertoe surgery on the right foot and has a similar problem with the next toe.  He will consult with his surgeon.  He was talking to Dr. Linus Mako about it as well during his virtual visit recently at the end of May and was told that potentially Botox injection into the toe may help.  I reported to patient that dystonia can respond to Botox injections but for hammertoe deformity I  have never injected a foot or toes.  I am not sure that he would be a good candidate for Botox injections, he is encouraged to talk to his foot surgeon as well. Work is going fairly well thankfully for him. Both boys will be in college this fall.   Observations/Objective: On examination, he is very pleasant and conversant, in no acute distress, good comprehension and language skills, speech is slightly slow and hypophonic, otherwise no dysarthria.  Face is symmetric with moderate facial masking noted, no lip, neck or jaw tremor noted.  Neck mobility slightly limited actively.  DBS scar unremarkable on the left.  He has no obvious resting tremor, no significant  postural or action tremor with the upper extremities, fine motor skills are mild to moderately impaired.  No abnormal involuntary movements noted in the upper body or head or neck area.  Assessment and Plan:  In summary, [name] is a very pleasant 63 year old male with a history of Parkinson's disease, right-sided predominant, diagnosed in 2010 with symptoms going back to 2009 most likely.He had no telltale history of RBD, but hadsome vivid dreamsin therecentpast. He is status post left STN DBS placement in February of 2019. He has done quite well after that. He continues to be on Neupro patch. We reduced the patch to 6 mg strength in May 2018 for lower extremity swelling and further down to 4 mg in January 2020, d/t swelling, which, per wife, did improve some since. He had gained weight. He had a HST on 12/23/2017 which indicated severe obstructive sleep apnea with a baseline AHI of 46.9/h, O2 nadir of 84%.  He has established treatment on AutoPap and is compliant with it.  He is highly commended for his treatment adherence.  He does not have any telltale improvements in his sleep quality, does have difficulty maintaining sleep at times.  He is motivated to continue with treatment.  His apnea scores are good, he uses a nasal cushion interface. He is up-to-date with his supplies.  I suggested we continue with his medications for now, he would like to keep the regimen the same, this includes Neupro patch 4 mg daily, Azilect 1 mg daily, Sinemet 25-100 mg strength 1-1/2 pills 4 times a day.  He may have developed some end of dose dyskinesias but is not particularly bothered by them.  He has had ongoing issues with hammertoes.  He is planning to see his surgeon.  I am not sure that he would be a good candidate for Botox injections, I have certainly never injected the toes for hammertoe issues, Botox can be effective in foot dystonia but I have never injected a foot for that reason myself, he is  encouraged to talk to Dr. Linus Mako again about it. I suggested a 3 month follow-up, in office, sooner if needed. I answered all their questions today and the patient and his wife are in agreement.  Follow Up Instructions:    I discussed the assessment and treatment plan with the patient. The patient was provided an opportunity to ask questions and all were answered. The patient agreed with the plan and demonstrated an understanding of the instructions.   The patient was advised to call back or seek an in-person evaluation if the symptoms worsen or if the condition fails to improve as anticipated.  I provided 30 minutes of non-face-to-face time during this encounter.   Star Age, MD

## 2018-09-28 ENCOUNTER — Telehealth: Payer: Self-pay

## 2018-09-28 NOTE — Telephone Encounter (Signed)
Dr. Rexene Alberts completed surgical clearance form. Faxed back to [facility], Received a receipt of confirmation.

## 2018-12-21 ENCOUNTER — Other Ambulatory Visit: Payer: Self-pay

## 2018-12-21 ENCOUNTER — Ambulatory Visit (INDEPENDENT_AMBULATORY_CARE_PROVIDER_SITE_OTHER): Payer: Managed Care, Other (non HMO) | Admitting: Neurology

## 2018-12-21 ENCOUNTER — Encounter: Payer: Self-pay | Admitting: Neurology

## 2018-12-21 DIAGNOSIS — G20 Parkinson's disease: Secondary | ICD-10-CM | POA: Diagnosis not present

## 2018-12-21 MED ORDER — RASAGILINE MESYLATE 1 MG PO TABS: 1.0000 mg | ORAL_TABLET | Freq: Every day | ORAL | 3 refills | Status: DC

## 2018-12-21 MED ORDER — CARBIDOPA-LEVODOPA 25-100 MG PO TABS: 1.5000 | ORAL_TABLET | Freq: Four times a day (QID) | ORAL | 3 refills | Status: DC

## 2018-12-21 NOTE — Patient Instructions (Signed)
Please try to exercise on a daily basis, use your stationary bike and your boxing equipment and also add some resistance and strength training to your exercise regimen.  Please be mindful about constipation issues and be proactive about it, stay well-hydrated, well rested.  Continue with your medicines at the same dose, you are doing well.  I plan to see you back in 4 months. Please talk to your primary care physician about your right hip pain.  You may benefit from seeing an orthopedic doctor.

## 2018-12-21 NOTE — Progress Notes (Signed)
Subjective:    Patient ID: [name] is a 63 y.o. male.  HPI     Interim history:   [name] is a 63 year old right-handed gentleman with an underlying history of hyperlipidemia and kidney stones, s/p DBS for PD, who presents for followup consultation of his right-sided predominant Parkinson's disease, as well as obstructive sleep apnea on AutoPap therapy.  The patient is accompanied by his wife today. I last saw him in a virtual visit on 07/30/2018, at which time he was compliant with his AutoPap.  I suggested we continue with his medication regimen including Azilect 1 mg once daily, Sinemet 1-1/2 pills 4 times a day and Neupro patch 4 mg daily.    Today, 12/21/2018: I reviewed his AutoPap compliance data from 11/17/2018 through 12/16/2018 which is a total of 30 days, during which time he used his machine 27 days with percent use days greater than 4 hours at 80%, indicating very good compliance with an average usage of 5 hours and 58 minutes, residual AHI at goal at 0.7/h, leak high with a 95th percentile at 29.5 L/min on a pressure range of 7 cm to 13 cm with EPR, 95th percentile of pressure at 8.4 cm.  He reports doing okay, reports that he does not necessarily like his AutoPap but he uses it consistently.  He has had ongoing issues with his right lower extremity, did not pursue surgery to the right foot, felt like it was the right hip more than his right foot that bothered him and restricted his walking abilities.  He has not been doing a whole lot of formal exercising except for using his stationary bike a couple of times a week.  He also has a punching bag in the garage that he can use for boxing.  Thankfully, he has not fallen.  He has an appointment at [facility] next week.  He has not had his right hip looked at. No issues with constipation, no memory loss thankfully.   The patient's allergies, current medications, family history, past medical history, past social history, past surgical  history and problem list were reviewed and updated as appropriate.    Previously (copied from previous notes for reference):    I saw him on 11/06/2017, at which time he felt stable from the Parkinson's standpoint.  He had recently seen Dr. Linus Mako at [facility] in August and programming was kept the same.  His lower extremity swelling had gotten worse.     He emailed in December and in early January 2020 we reduced his Neupro patch from 6 mg to 4 mg daily.   He saw Cecille Rubin, nurse practitioner in the interim on 03/31/2018 for his first AutoPap visit after his home sleep test and he was adequate with his AutoPap compliance at the time.    I reviewed his AutoPap compliance data from 06/29/2018 through 07/28/2018 which is a total of 30 days, during which time he used his machine 26 days with percent used days greater than 4 hours at 77%, indicating good compliance with an average usage of 4 hours and 43 minutes only, residual AHI at goal at 1/h, 95th percentile of pressure at 8.3 cm, leak on the higher side with a 95th percentile at 18.3 L/min on a pressure range of 7 cm to 13 cm with EPR.    I saw him on 05/06/2017, at which time he was doing well, walking and tremor were since his first programming appointment on 04/18/2017. Of note,  he had his left DBS surgery on 04/04/2017 under Dr. Salomon Fick. He has had a very good experience overall from beginning to end.      I saw him on 11/05/2016, at which time he reported no significant repercussions after reducing the Neupro and increasing the Sinemet. His swelling had improved a little bit. He was still in the process of being evaluated for DBS surgery. I suggested we keep his medication regimen the same. We talked about the importance of losing weight especially in preparation for elective surgery.     I saw him on 07/04/2016, at which time he reported doing okay, was planning to pursue DBS surgery perhaps later this year. He was quite apprehensive.  He had not scheduled his psychological evaluation or surgical consultation. He was overall considered a good candidate for DBS treatment per neurology. He was more on time with his Sinemet and noticed improvement in his symptoms, lower extremity swelling was slightly worse. Neupro was working well at 8 mg, but d/t swelling, I suggested with reduce it. I also suggested we increase his Sinemet to 1-1/2 pills for times a day in lieu of keeping the Neupro at 8 mg daily. He was no longer taking gabapentin and his foot pain was indeed a little bit better with time, gabapentin made him too sleepy.    I saw him on 03/05/2016, at which time he reported taking C/L 4 times a day, but not always all 4 doses, as he would forget at times. Low-dose gabapentin prn was helping his foot pain. No recent falls, no recent mood or memory issues, no word on the referral from [facility]. Had a recent cold and congestion. He was advised to continue with the current management, including Sinemet 1 pill 4 times a day, Azilect 1 mg once daily, Neupro patch 8 mg once daily, gabapentin 300 mg at night as needed.   I resubmitted referral to [facility] for DBS Eval and patient was seen in the interim by Dr. Linus Mako on 05/24/16. I reviewed the note.     I saw him on 12/04/2015, at which time he reported ongoing issues with his right foot including status post surgery in April 2017 for hammertoes but he had ongoing issues with pain and more stiffness. He felt overall that his right side was more stiff and tremulous. Since starting Sinemet he had noted no telltale difference in his symptoms. He had consulted with different podiatrists and had tried orthotics. Mood and memory were stable. He was not sleeping very well. We talked about DBS treatment in more detail. I made a referral to Dr. Linus Mako at [facility]. I also increased his Sinemet to 1 pill 4 times a day. I suggested we try him on low-dose gabapentin for his right foot pain.   I saw him on  08/01/2015, at which time he reported feeling worse. He had more difficulty exercising, he had gained weight. He had foot surgery and his balance had been worse since then. He had hammertoe surgery on 05/25/2015. Because of decrease in mobility after his surgery his weight increase. His tremor was worse. Memory was stable. I suggested we continue with Neupro 8 mg patch daily. I suggested he continue with Azilect once daily. I suggested we start him on the rytary 95 mg strength with gradual titration. However, he called in July reporting that the medication was too expensive, I therefore, switched him to Sinemet generic. He also requested a handicap placard form to be filled out which I provided  in September.   Of note, he missed an appointment on 06/20/2015 due to being sick. I saw him on 12/01/2014, at which time he reported a change in his insurance, mood and memory were stable, lower extremity swelling was stable, we kept his Neupro patch at 8 mg an Azilect 1 mg once daily the same. He was working full-time, also active with his 2 teenage boys, 61 year old an 50 year old. His wife had a recent change in her job.    I saw him on 08/02/2014, at which time he reported doing well. He had some leg swelling. His right hand swelling was a little worse. Cognitively and mood wise he was stable. Balance may have been off at times but generally speaking he was doing well.    I saw him on 04/05/2014, at which time he reported feeling fairly stable but his wife felt his balance was not as good. He was working out with a Clinical research associate. He had picked up boxing. He was working full-time. He cut back on his sodas. I kept him on his medications, Neupro patch 6 mg and Azilect 1 mg. He had some ongoing issues with lower extremity swelling and I referred him to vascular surgery for consultation. He was seen by a vascular specialist, Dr. Scot Dock on 04/13/2014. He had Doppler studies to his lower extremities and was advised he had no  DVT and good arterial flow. His swelling improved.    I saw him on 12/03/2013, at which time he reported doing well and had noted a benefit from Neupro patch 6 mg. I continued him on this dose as well as Azilect 1 mg once daily. His exam was stable.   I saw him on 07/28/2013, at which time he reported some worsening of his tremor and his gait. He was able to tolerate neupro patch 4 mg strength. He has had some skin irritation, most likely from the adhesive. He was not exercising regularly but endorsed being active and working full-time. His memory was stable. He denies any impulse control disorder but had mild daytime somnolence which was not as severe as when he was on Requip long-acting.     I saw him on 02/05/2013, at which time I felt his physical exam was a little worse since stopping Requip XL. He had stopped this because of daytime somnolence and severe nausea. I suggested we start him on Neupro patch. I provided him with samples. We talked about doing a sleep study down the [place].     I first met him on 07/15/2012, at which time I suggested he start taking coenzyme Q 10. I also suggested that he switch his Requip XL 4 mg to nighttime because of report of daytime somnolence. I did not increase make any other changes to his medications and felt that he was overall stable.     He previously followed with Dr. Jeneen Rinks love and was last seen by him on 03/17/2012 at which time Dr. Erling Cruz increase his Requip XL and continued him on Azilect. He was diagnosed in 12/2008, and Sx go back to a year prior to that.   MRI brain without contrast was done in the past. He has been on rasagiline, which improved his tremor. 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 There is no family history of tremor. There is no history of REM behavior disorder. He has had no falls, or hallucinations or involuntary movements otherwise. He exercises not very regularly. His memory and  mood have  been stable. He has no problems with lower extremities swelling or compulsive thoughts or gambling. He works full-time as a Engineer, site.    His Past Medical History Is Significant For: Past Medical History:  Diagnosis Date  . Calculus of kidney   . Lesion of ulnar nerve   . Paralysis agitans ([place])   . Parkinson's disease ([place])   . Sleep disturbance, unspecified     His Past Surgical History Is Significant For: Past Surgical History:  Procedure Laterality Date  . HAMMERTOE RECONSTRUCTION WITH WEIL OSTEOTOMY Right 05/25/2015   Procedure: RIGHT SECOND TOE METATARSAL WEIL OSTEOTOMY  HAMMERTOE CORRECTION COLLATERAL LIGAMENT REPAIR; FLEXOR TO EXTENSOR TRANSFER ;  Surgeon: Wylene Simmer, MD;  Location: [facility];  Service: Orthopedics;  Laterality: Right;  . kidney stones  1991    His Family History Is Significant For: Family History  Problem Relation Age of Onset  . Heart failure Father   . Cancer Father   . Diabetes Father   . Sleep apnea Mother   . Sleep apnea Brother     His Social History Is Significant For: Social History   Socioeconomic History  . Marital status: Married    Spouse name: Not on file  . Number of children: Not on file  . Years of education: Not on file  . Highest education level: Not on file  Occupational History  . Not on file  Social Needs  . Financial resource strain: Not on file  . Food insecurity    Worry: Not on file    Inability: Not on file  . Transportation needs    Medical: Not on file    Non-medical: Not on file  Tobacco Use  . Smoking status: Never Smoker  . Smokeless tobacco: Never Used  Substance and Sexual Activity  . Alcohol use: Yes    Alcohol/week: 3.0 standard drinks    Types: 3 Standard drinks or equivalent per week    Comment: social  . Drug use: No  . Sexual activity: Not on file  Lifestyle  . Physical activity    Days per week: Not on file    Minutes per session: Not on file  . Stress: Not on file   Relationships  . Social Herbalist on phone: Not on file    Gets together: Not on file    Attends religious service: Not on file    Active member of club or organization: Not on file    Attends meetings of clubs or organizations: Not on file    Relationship status: Not on file  Other Topics Concern  . Not on file  Social History Narrative   Consumes 1 soda a day     His Allergies Are:  No Known Allergies:   His Current Medications Are:  Outpatient Encounter Medications as of 12/21/2018  Medication Sig  . carbidopa-levodopa (SINEMET IR) 25-100 MG tablet Take 1.5 tablets by mouth 4 (four) times daily. At 8, 12, 4 PM and 8 PM  . rasagiline (AZILECT) 1 MG TABS tablet Take 1 tablet (1 mg total) by mouth daily.  . rotigotine (NEUPRO) 4 MG/24HR Place 1 patch onto the skin daily.  . [DISCONTINUED] carbidopa-levodopa (SINEMET IR) 25-100 MG tablet Take 1.5 tablets by mouth 4 (four) times daily. At 8, 12, 4 PM and 8 PM  . [DISCONTINUED] rasagiline (AZILECT) 1 MG TABS tablet Take 1 tablet (1 mg total) by mouth daily.   No facility-administered encounter  medications on file as of 12/21/2018.   :  Review of Systems:  Out of a complete 14 point review of systems, all are reviewed and negative with the exception of these symptoms as listed below: Review of Systems  Neurological:       Pt presents today to discuss his PD. Pt reports that he has been doing ok.    Objective:  Neurological Exam  Physical Exam Physical Examination:   Vitals:   12/21/18 1138  BP: 113/79  Pulse: 73  Temp: (!) 97.5 F (36.4 C)   General Examination: The patient is a very pleasant 63 y.o. male in no acute distress. He appears well-developed and well-nourished and well groomed.   HEENT:Normocephalic, unremarkable scar left parietal skull/scalp from DBS. Pupils are equal, round and reactive to light and accommodation. Extraocular tracking shows mild saccadic breakdown without nystagmus noted. There  is no limitation to his gaze. There is mild decrease in eye blink rate. Hearing is intact. Face is symmetric with moderatefacial masking, no lip, neck or jaw tremor. Oropharynx exam reveals moderate airway crowding. There is no sialorrhea.   Chest:is clear to auscultation without wheezing, rhonchi or crackles noted.  Heart:sounds are regular and normal without murmurs, rubs or gallops noted.   Abdomen:is soft, non-tender and non-distended with normal bowel sounds appreciated on auscultation.  Extremities:There 1-2+ pitting edema in the distal lower extremities bilaterally, slightly better.   Skin: is warm and dry with no trophic changes noted. Chronic swelling type changes and mild redness in both distal lower extremities, better.  Musculoskeletal: exam reveals nonew findings.(status post right second toe hammertoe repair).  Neurologically:  Mental status: The patient is awake and alert, paying good attention. He is able to completely provide the history. He is oriented to: person, place, time/date, situation, day of week, month of year and year. His memory, attention, language and knowledge are intact. There is no aphasia, agnosia, apraxia or anomia. There is a no bradyphrenia. Speech is mildly hypophonic with no dysarthria noted. Mood is congruent and affect is normal.   Cranial nerves are as described above under HEENT exam. In addition, shoulder shrug is normal, but right shoulder is a little higher than left shoulder, stable findings.  Motor exam: Normal bulk, and strength for age is noted. There are no dyskinesias noted.  Tone is mild to moderately rigid with presence of cogwheeling in the right upper extremity and RLE. There is overall moderate bradykinesia. There is no drift or rebound.  There is an intermittent mild tomoderate resting tremor in the right upper extremity and a slight and intermittent on the LUE.   Fine motor skills exam:moderate impairment noted on  the right, mild to moderate impairment on the left.   Cerebellar testing shows no dysmetria or intention tremor on finger to nose testing. Heel to shin is unremarkable bilaterally. There is no truncal or gait ataxia.   Sensory exam is intact to light touch in both upper and lower extremities.  Gait, station and balance: He stands up from the seated position with no significant difficulty but does need to push up with His hands. He needs no assistance. No veering to one side is noted. Posture fairly good today, no obvious limp today.     Assessment and Plan:   In summary, [name] is a very pleasant 63 year old male with an underlying history of hyperlipidemia and kidney stones, s/p DBS for PD, who presents for followup consultation of his right-sided predominant Parkinson's disease, as  well as obstructive sleep apnea on AutoPap therapy. He was diagnosed with Parkinson's disease in 2010, symptoms date back to 2009. He had no telltale history of RBD, hadsome vivid dreamsin thepast. He is status post left STN DBS placement in February 2019, and has done quite well after that. He continues to be on Neupro patch. We reduced the patch to 6 mg strength in May 2018 for lower extremity swelling. I suggested we reduce his Neupro patch further earlier this year. His home sleep test from 12/23/2017 showed severe obstructive sleep apnea with an AHI of 46.9/h, O2 nadir of 84%.  He has established treatment on AutoPap.  He is compliant with treatment and commended for it. He has a family history of obstructive sleep apnea in his mother and brother. He has an appointment with the nurse practitioner at [facility] next week.  He is advised to talk to his primary care physician about seeing an orthopedic surgeon for his right hip pain.  He may benefit from evaluation from the arthritis standpoint, bursitis etc.  He may benefit from a local steroid injection even.  He is advised to continue to exercise on a  regular basis, using his stationary bike does not aggravate his foot or hip.  He is encouraged to use it on a regular basis and also add some strength training.  He is advised to continue with Sinemet 1-1/2 pills 4 times a day and Azilect once daily as well as Neupro patch 4 mg daily.  He is advised to follow-up with me in about 4 months, sooner if needed.  I answered all their questions today and the patient and his wife were in agreement.

## 2019-01-26 ENCOUNTER — Other Ambulatory Visit: Payer: Self-pay | Admitting: Neurology

## 2019-02-12 HISTORY — PX: COLONOSCOPY: SHX174

## 2019-02-18 ENCOUNTER — Telehealth: Payer: Self-pay | Admitting: Neurology

## 2019-02-18 ENCOUNTER — Encounter: Payer: Self-pay | Admitting: Neurology

## 2019-02-18 NOTE — Telephone Encounter (Signed)
Spoke with patient and advised him of Dr Teofilo Pod reply, recommendations. He stated he isn't feeling bad at all,  verbalized understanding, appreciation.

## 2019-02-18 NOTE — Telephone Encounter (Signed)
Please advise patient that he should not have any major side effects from taking a double dose of Azilect.  If he has any lightheadedness or low blood pressure values, he may need to seek medical attention through urgent care or emergency room.  Other than that he should not have any major repercussions from taking a second dose and he can take his usual dose tomorrow.  Please call him back to reassure him.

## 2019-02-18 NOTE — Telephone Encounter (Signed)
I called patient who stated he took the doses 4 hours apart. He feels a little shaky, but thinks it is because he's nervous about taking extra dose. He stated he read drug insert, isn't having any side effects listed.  I advised him I sent message to Dr Frances Furbish and will call him back with her reply. I advised any sudden onset of symptoms he read about, he needs to seek  Medical attention. Patient verbalized understanding, appreciation.

## 2019-02-18 NOTE — Telephone Encounter (Signed)
Pt called stating he has taken a double dose of his rasagiline (AZILECT) 1 MG TABS tablet on accident and he is needing to speak to RN to make sure he is going to be ok or does he need to seek medical attention. Please advise.

## 2019-03-18 ENCOUNTER — Other Ambulatory Visit: Payer: Self-pay | Admitting: Family Medicine

## 2019-03-18 DIAGNOSIS — R1031 Right lower quadrant pain: Secondary | ICD-10-CM

## 2019-03-19 ENCOUNTER — Ambulatory Visit (INDEPENDENT_AMBULATORY_CARE_PROVIDER_SITE_OTHER): Payer: Managed Care, Other (non HMO)

## 2019-03-19 ENCOUNTER — Other Ambulatory Visit: Payer: Self-pay

## 2019-03-19 ENCOUNTER — Encounter: Payer: Self-pay | Admitting: Neurology

## 2019-03-19 DIAGNOSIS — R1031 Right lower quadrant pain: Secondary | ICD-10-CM

## 2019-03-19 MED ORDER — IOHEXOL 300 MG/ML  SOLN
100.0000 mL | Freq: Once | INTRAMUSCULAR | Status: AC | PRN
  Administered 2019-03-19: 100 mL via INTRAVENOUS

## 2019-03-24 ENCOUNTER — Other Ambulatory Visit: Payer: Self-pay | Admitting: General Surgery

## 2019-03-24 DIAGNOSIS — R1011 Right upper quadrant pain: Secondary | ICD-10-CM

## 2019-03-25 ENCOUNTER — Other Ambulatory Visit: Payer: Self-pay

## 2019-03-25 ENCOUNTER — Ambulatory Visit (HOSPITAL_COMMUNITY)
Admission: RE | Admit: 2019-03-25 | Discharge: 2019-03-25 | Disposition: A | Discharge status: Home / Self Care | Payer: Managed Care, Other (non HMO) | Source: Ambulatory Visit | Attending: General Surgery | Admitting: General Surgery

## 2019-03-25 DIAGNOSIS — R1011 Right upper quadrant pain: Secondary | ICD-10-CM | POA: Diagnosis present

## 2019-04-12 ENCOUNTER — Encounter (HOSPITAL_COMMUNITY): Payer: Self-pay | Admitting: Urology

## 2019-04-12 ENCOUNTER — Other Ambulatory Visit (HOSPITAL_COMMUNITY)
Admission: RE | Admit: 2019-04-12 | Discharge: 2019-04-12 | Disposition: A | Discharge status: Home / Self Care | Payer: Managed Care, Other (non HMO) | Source: Ambulatory Visit | Attending: Urology | Admitting: Urology

## 2019-04-12 ENCOUNTER — Other Ambulatory Visit: Payer: Self-pay

## 2019-04-12 ENCOUNTER — Other Ambulatory Visit: Payer: Self-pay | Admitting: Urology

## 2019-04-12 DIAGNOSIS — Z20822 Contact with and (suspected) exposure to covid-19: Secondary | ICD-10-CM | POA: Insufficient documentation

## 2019-04-12 DIAGNOSIS — Z01812 Encounter for preprocedural laboratory examination: Secondary | ICD-10-CM | POA: Diagnosis present

## 2019-04-12 LAB — SARS CORONAVIRUS 2 (TAT 6-24 HRS): SARS Coronavirus 2: NEGATIVE

## 2019-04-13 MED ORDER — DEXTROSE 5 % IV SOLN
3.0000 g | INTRAVENOUS | Status: AC
  Administered 2019-04-14: 3 g via INTRAVENOUS
  Filled 2019-04-13: qty 3

## 2019-04-14 ENCOUNTER — Ambulatory Visit (HOSPITAL_COMMUNITY)
Admission: RE | Admit: 2019-04-14 | Discharge: 2019-04-14 | Disposition: A | Discharge status: Home / Self Care | Payer: Managed Care, Other (non HMO) | Attending: Urology | Admitting: Urology

## 2019-04-14 ENCOUNTER — Encounter (HOSPITAL_COMMUNITY): Payer: Self-pay | Admitting: Urology

## 2019-04-14 ENCOUNTER — Ambulatory Visit (HOSPITAL_COMMUNITY): Payer: Managed Care, Other (non HMO) | Admitting: Certified Registered"

## 2019-04-14 ENCOUNTER — Encounter (HOSPITAL_COMMUNITY)
Admission: RE | Disposition: A | Discharge status: Home / Self Care | Payer: Self-pay | Source: Home / Self Care | Attending: Urology

## 2019-04-14 ENCOUNTER — Ambulatory Visit (HOSPITAL_COMMUNITY): Payer: Managed Care, Other (non HMO)

## 2019-04-14 DIAGNOSIS — Z87442 Personal history of urinary calculi: Secondary | ICD-10-CM | POA: Insufficient documentation

## 2019-04-14 DIAGNOSIS — G4730 Sleep apnea, unspecified: Secondary | ICD-10-CM | POA: Diagnosis not present

## 2019-04-14 DIAGNOSIS — Z6833 Body mass index (BMI) 33.0-33.9, adult: Secondary | ICD-10-CM | POA: Diagnosis not present

## 2019-04-14 DIAGNOSIS — E669 Obesity, unspecified: Secondary | ICD-10-CM | POA: Diagnosis not present

## 2019-04-14 DIAGNOSIS — G20 Parkinson's disease: Secondary | ICD-10-CM | POA: Insufficient documentation

## 2019-04-14 DIAGNOSIS — N1330 Unspecified hydronephrosis: Secondary | ICD-10-CM | POA: Diagnosis present

## 2019-04-14 HISTORY — PX: CYSTOSCOPY WITH RETROGRADE PYELOGRAM, URETEROSCOPY AND STENT PLACEMENT: SHX5789

## 2019-04-14 HISTORY — DX: Sleep apnea, unspecified: G47.30

## 2019-04-14 HISTORY — DX: Personal history of urinary calculi: Z87.442

## 2019-04-14 LAB — CBC WITH DIFFERENTIAL/PLATELET
Abs Immature Granulocytes: 0.01 10*3/uL (ref 0.00–0.07)
Basophils Absolute: 0 10*3/uL (ref 0.0–0.1)
Basophils Relative: 0 %
Eosinophils Absolute: 0.3 10*3/uL (ref 0.0–0.5)
Eosinophils Relative: 5 %
HCT: 44.3 % (ref 39.0–52.0)
Hemoglobin: 14.6 g/dL (ref 13.0–17.0)
Immature Granulocytes: 0 %
Lymphocytes Relative: 21 %
Lymphs Abs: 1.2 10*3/uL (ref 0.7–4.0)
MCH: 27.5 pg (ref 26.0–34.0)
MCHC: 33 g/dL (ref 30.0–36.0)
MCV: 83.4 fL (ref 80.0–100.0)
Monocytes Absolute: 0.8 10*3/uL (ref 0.1–1.0)
Monocytes Relative: 14 %
Neutro Abs: 3.5 10*3/uL (ref 1.7–7.7)
Neutrophils Relative %: 60 %
Platelets: 207 10*3/uL (ref 150–400)
RBC: 5.31 MIL/uL (ref 4.22–5.81)
RDW: 13.2 % (ref 11.5–15.5)
WBC: 5.8 10*3/uL (ref 4.0–10.5)
nRBC: 0 % (ref 0.0–0.2)

## 2019-04-14 SURGERY — CYSTOURETEROSCOPY, WITH RETROGRADE PYELOGRAM AND STENT INSERTION
Anesthesia: General | Laterality: Right

## 2019-04-14 MED ORDER — SUCCINYLCHOLINE CHLORIDE 20 MG/ML IJ SOLN
INTRAMUSCULAR | Status: DC | PRN
  Administered 2019-04-14: 100 mg via INTRAVENOUS

## 2019-04-14 MED ORDER — PROPOFOL 10 MG/ML IV BOLUS
INTRAVENOUS | Status: DC | PRN
  Administered 2019-04-14: 100 mg via INTRAVENOUS
  Administered 2019-04-14: 200 mg via INTRAVENOUS
  Administered 2019-04-14: 100 mg via INTRAVENOUS

## 2019-04-14 MED ORDER — FENTANYL CITRATE (PF) 100 MCG/2ML IJ SOLN
INTRAMUSCULAR | Status: AC
  Filled 2019-04-14: qty 2

## 2019-04-14 MED ORDER — LIDOCAINE 2% (20 MG/ML) 5 ML SYRINGE
INTRAMUSCULAR | Status: AC
  Filled 2019-04-14: qty 5

## 2019-04-14 MED ORDER — PROPOFOL 10 MG/ML IV BOLUS
INTRAVENOUS | Status: AC
  Filled 2019-04-14: qty 20

## 2019-04-14 MED ORDER — LIDOCAINE 2% (20 MG/ML) 5 ML SYRINGE
INTRAMUSCULAR | Status: DC | PRN
  Administered 2019-04-14: 60 mg via INTRAVENOUS

## 2019-04-14 MED ORDER — PHENYLEPHRINE 40 MCG/ML (10ML) SYRINGE FOR IV PUSH (FOR BLOOD PRESSURE SUPPORT)
PREFILLED_SYRINGE | INTRAVENOUS | Status: DC | PRN
  Administered 2019-04-14: 120 ug via INTRAVENOUS

## 2019-04-14 MED ORDER — FENTANYL CITRATE (PF) 100 MCG/2ML IJ SOLN
INTRAMUSCULAR | Status: DC | PRN
  Administered 2019-04-14: 50 ug via INTRAVENOUS

## 2019-04-14 MED ORDER — MIDAZOLAM HCL 2 MG/2ML IJ SOLN
INTRAMUSCULAR | Status: DC | PRN
  Administered 2019-04-14: 2 mg via INTRAVENOUS

## 2019-04-14 MED ORDER — LACTATED RINGERS IV SOLN: INTRAVENOUS | Status: DC

## 2019-04-14 MED ORDER — PHENYLEPHRINE 40 MCG/ML (10ML) SYRINGE FOR IV PUSH (FOR BLOOD PRESSURE SUPPORT)
PREFILLED_SYRINGE | INTRAVENOUS | Status: AC
  Filled 2019-04-14: qty 10

## 2019-04-14 MED ORDER — SODIUM CHLORIDE 0.9 % IR SOLN
Status: DC | PRN
  Administered 2019-04-14: 3000 mL

## 2019-04-14 MED ORDER — IOHEXOL 300 MG/ML  SOLN
INTRAMUSCULAR | Status: DC | PRN
  Administered 2019-04-14: 30 mL via URETHRAL

## 2019-04-14 MED ORDER — ONDANSETRON HCL 4 MG/2ML IJ SOLN
INTRAMUSCULAR | Status: AC
  Filled 2019-04-14: qty 2

## 2019-04-14 MED ORDER — MIDAZOLAM HCL 2 MG/2ML IJ SOLN
INTRAMUSCULAR | Status: AC
  Filled 2019-04-14: qty 2

## 2019-04-14 MED ORDER — HYDROCODONE-ACETAMINOPHEN 5-325 MG PO TABS: 1.0000 | ORAL_TABLET | ORAL | 0 refills | Status: DC | PRN

## 2019-04-14 MED ORDER — ONDANSETRON HCL 4 MG/2ML IJ SOLN
INTRAMUSCULAR | Status: DC | PRN
  Administered 2019-04-14: 4 mg via INTRAVENOUS

## 2019-04-14 SURGICAL SUPPLY — 29 items
BAG URINE DRAIN 2000ML AR STRL (UROLOGICAL SUPPLIES) ×3 IMPLANT
BAG URO CATCHER STRL LF (MISCELLANEOUS) ×3 IMPLANT
BALLN NEPHROSTOMY (BALLOONS)
BALLOON NEPHROSTOMY (BALLOONS) IMPLANT
BASKET LASER NITINOL 1.9FR (BASKET) IMPLANT
BASKET ZERO TIP NITINOL 2.4FR (BASKET) IMPLANT
CATH FOLEY 2W COUNCIL 20FR 5CC (CATHETERS) IMPLANT
CATH INTERMIT  6FR 70CM (CATHETERS) ×3 IMPLANT
CATH ROBINSON RED A/P 14FR (CATHETERS) IMPLANT
CATH URET 5FR 28IN CONE TIP (BALLOONS)
CATH URET 5FR 70CM CONE TIP (BALLOONS) IMPLANT
CLOTH BEACON ORANGE TIMEOUT ST (SAFETY) ×3 IMPLANT
EXTRACTOR STONE 1.7FRX115CM (UROLOGICAL SUPPLIES) IMPLANT
FIBER LASER FLEXIVA 365 (UROLOGICAL SUPPLIES) IMPLANT
FIBER LASER TRAC TIP (UROLOGICAL SUPPLIES) IMPLANT
GLOVE BIO SURGEON STRL SZ7.5 (GLOVE) ×3 IMPLANT
GOWN STRL REUS W/TWL XL LVL3 (GOWN DISPOSABLE) ×3 IMPLANT
GUIDEWIRE ANG ZIPWIRE 038X150 (WIRE) IMPLANT
GUIDEWIRE STR DUAL SENSOR (WIRE) ×6 IMPLANT
KIT TURNOVER KIT A (KITS) IMPLANT
MANIFOLD NEPTUNE II (INSTRUMENTS) ×3 IMPLANT
NS IRRIG 1000ML POUR BTL (IV SOLUTION) IMPLANT
PACK CYSTO (CUSTOM PROCEDURE TRAY) ×3 IMPLANT
PENCIL SMOKE EVACUATOR (MISCELLANEOUS) IMPLANT
SHEATH URETERAL 12FRX28CM (UROLOGICAL SUPPLIES) IMPLANT
SHEATH URETERAL 12FRX35CM (MISCELLANEOUS) ×3 IMPLANT
STENT URET 6FRX26 CONTOUR (STENTS) ×3 IMPLANT
TUBING CONNECTING 10 (TUBING) ×3 IMPLANT
TUBING UROLOGY SET (TUBING) ×3 IMPLANT

## 2019-04-14 NOTE — Op Note (Signed)
Operative Note  Preoperative diagnosis:  1.  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  Nonobstructive right hydronephrosis  Procedure(s): 1.  Cystoscopy with right retrograde pyelogram, right diagnostic ureteroscopy, right ureteral stent placement  Surgeon: Modena Slater, MD  Assistants: None  Anesthesia: General  Complications: None immediate  EBL: Minimal  Specimens: 1.  None  Drains/Catheters: 1.  6 x 26 double-J ureteral stent with a string in place  Intraoperative findings: 1.  Normal anterior urethra 2.  Nonobstructing prostate 3.  Left ureteral orifice appeared normal.  Bladder mucosa was normal.  The right ureteral orifice was wide open consistent with possible prior ureteral reimplant. 4.  Retrograde pyelogram revealed hydronephrosis.  There appeared to be a transition point in the distal ureter.  However, diagnostic ureteroscopy revealed a wide open ureter without any evidence of obstruction.  There was mild curvature at the distal ureter but it was very easy to advance the scope and access sheath up the ureter as it was wide open.  There were no abnormalities within the kidney.  Retrograde pyelogram confirmed hydronephrosis of the right kidney.  Indication: 64 year old male with right-sided abdominal pain was found to have right-sided hydroureteronephrosis with possible transition point consistent with stricture in the distal ureter.  He has had what sounds like a prior ureterolithotomy back in the 1980s.  Given his persistent pain and the finding of hydronephrosis, decision was made to proceed with the above operation for possible treatment of ureteral stricture.  Description of procedure:  The patient was identified and consent was obtained.  The patient was taken to the operating room and placed in the supine position.  The patient was placed under general anesthesia.  Perioperative antibiotics were administered.  The patient was  placed in dorsal lithotomy.  Patient was prepped and draped in a standard sterile fashion and a timeout was performed.  A 21 French rigid cystoscope was advanced into the urethra and into the bladder.  Complete cystoscopy was performed with findings noted above.  The right ureter was cannulated with an open-ended ureteral catheter and a retrograde pyelogram was performed with findings noted above.  A sensor wire was advanced up the ureter into the kidney under fluoroscopic guidance.  A semirigid ureteroscope was advanced alongside the wire and up the ureter.  There was mild kinking noted in the distal ureter but the scope navigated past this very easily and it was wide open.  I advanced this up to the renal pelvis.  I shot another retrograde pyelogram through the scope and there was hydronephrosis.  I advanced a second wire through the scope and withdrew the scope.  A 12 x 14 ureteral access sheath was advanced over one of the wires under continuous fluoroscopic guidance and this passed very easily with no resistance.  The inner sheath along with the wire were withdrawn.  Flexible ureteroscopy was performed.  There is no ureteropelvic junction obstruction.  There was obvious dilation of the calyces.  There were no stones or lesions.  Therefore withdrew the scope along with the access sheath.  There were no abnormalities along the ureter upon removal.  I backloaded the wire onto a 6 x 26 double-J ureteral stent with the string in place and advanced this up the ureter under fluoroscopic guidance and withdrew the wire.  Fluoroscopy confirmed proximal placement as well as distal placement.  I advanced the scope into the bladder again drained the bladder.  Also confirmed a good coil within the bladder.  This include the operation.  Patient tolerated procedure well and was stable postoperative.  Plan: Patient may remove his stent on Wednesday and can follow-up as needed.  Hydronephrosis likely secondary to reflux.   There was no evidence of obstruction.

## 2019-04-14 NOTE — Discharge Instructions (Addendum)
[facility] [phone] Post Ureteroscopy With or Without Stent Instructions  Remove the stent on Wednesday morning by gently pulling on the string  Definitions:  Ureter: The duct that transports urine from the kidney to the bladder. Stent:   A plastic hollow tube that is placed into the ureter, from the kidney to the                 bladder to prevent the ureter from swelling shut.  GENERAL INSTRUCTIONS:  Despite the fact that no skin incisions were used, the area around the ureter and bladder is raw and irritated. The stent is a foreign body which will further irritate the bladder wall. This irritation is manifested by increased frequency of urination, both day and night, and by an increase in the urge to urinate. In some, the urge to urinate is present almost always. Sometimes the urge is strong enough that you may not be able to stop yourself from urinating. The only real cure is to remove the stent and then give time for the bladder wall to heal which can't be done until the danger of the ureter swelling shut has passed, which varies.  You may see some blood in your urine while the stent is in place and a few days afterwards. Do not be alarmed, even if the urine was clear for a while. Get off your feet and drink lots of fluids until clearing occurs. If you start to pass clots or don't improve, call us.  DIET: You may return to your normal diet immediately. Because of the raw surface of your bladder, alcohol, spicy foods, acid type foods and drinks with caffeine may cause irritation or frequency and should be used in moderation. To keep your urine flowing freely and to avoid constipation, drink plenty of fluids during the day ( 8-10 glasses ). Tip: Avoid cranberry juice because it is very acidic.  ACTIVITY: Your physical activity doesn't need to be restricted. However, if you are very active, you may see some blood in your urine. We suggest that you reduce your activity  under these circumstances until the bleeding has stopped.  BOWELS: It is important to keep your bowels regular during the postoperative period. Straining with bowel movements can cause bleeding. A bowel movement every other day is reasonable. Use a mild laxative if needed, such as Milk of Magnesia 2-3 tablespoons, or 2 Dulcolax tablets. Call if you continue to have problems. If you have been taking narcotics for pain, before, during or after your surgery, you may be constipated. Take a laxative if necessary.   MEDICATION: You should resume your pre-surgery medications unless told not to. You may take oxybutynin or flomax if prescribed for bladder spasms or discomfort from the stent Take pain medication as directed for pain refractory to conservative management  PROBLEMS YOU SHOULD REPORT TO [place]:  Fevers over 100.5 Fahrenheit.  Heavy bleeding, or clots ( See above notes about blood in urine ).  Inability to urinate.  Drug reactions ( hives, rash, nausea, vomiting, diarrhea ).  Severe burning or pain with urination that is not improving.   General Anesthesia, Adult, Care After This sheet gives you information about how to care for yourself after your procedure. Your health care provider may also give you more specific instructions. If you have problems or questions, contact your health care provider. What can I expect after the procedure? After the procedure, the following side effects are common:  Pain or discomfort at the IV  site.  Nausea.  Vomiting.  Sore throat.  Trouble concentrating.  Feeling cold or chills.  Weak or tired.  Sleepiness and fatigue.  Soreness and body aches. These side effects can affect parts of the body that were not involved in surgery. Follow these instructions at home:  For at least 24 hours after the procedure:  Have a responsible adult stay with you. It is important to have someone help care for you until you are awake and alert.  Rest as  needed.  Do not: ? Participate in activities in which you could fall or become injured. ? Drive. ? Use heavy machinery. ? Drink alcohol. ? Take sleeping pills or medicines that cause drowsiness. ? Make important decisions or sign legal documents. ? Take care of children on your own. Eating and drinking  Follow any instructions from your health care provider about eating or drinking restrictions.  When you feel hungry, start by eating small amounts of foods that are soft and easy to digest (bland), such as toast. Gradually return to your regular diet.  Drink enough fluid to keep your urine pale yellow.  If you vomit, rehydrate by drinking water, juice, or clear broth. General instructions  If you have sleep apnea, surgery and certain medicines can increase your risk for breathing problems. Follow instructions from your health care provider about wearing your sleep device: ? Anytime you are sleeping, including during daytime naps. ? While taking prescription pain medicines, sleeping medicines, or medicines that make you drowsy.  Return to your normal activities as told by your health care provider. Ask your health care provider what activities are safe for you.  Take over-the-counter and prescription medicines only as told by your health care provider.  If you smoke, do not smoke without supervision.  Keep all follow-up visits as told by your health care provider. This is important. Contact a health care provider if:  You have nausea or vomiting that does not get better with medicine.  You cannot eat or drink without vomiting.  You have pain that does not get better with medicine.  You are unable to pass urine.  You develop a skin rash.  You have a fever.  You have redness around your IV site that gets worse. Get help right away if:  You have difficulty breathing.  You have chest pain.  You have blood in your urine or stool, or you vomit blood. Summary  After the  procedure, it is common to have a sore throat or nausea. It is also common to feel tired.  Have a responsible adult stay with you for the first 24 hours after general anesthesia. It is important to have someone help care for you until you are awake and alert.  When you feel hungry, start by eating small amounts of foods that are soft and easy to digest (bland), such as toast. Gradually return to your regular diet.  Drink enough fluid to keep your urine pale yellow.  Return to your normal activities as told by your health care provider. Ask your health care provider what activities are safe for you. This information is not intended to replace advice given to you by your health care provider. Make sure you discuss any questions you have with your health care provider. Document Revised: 01/31/2017 Document Reviewed: 09/13/2016 Elsevier Patient Education  [place].

## 2019-04-14 NOTE — Anesthesia Postprocedure Evaluation (Signed)
Anesthesia Post Note  Patient: [name]  Procedure(s) Performed: CYSTOSCOPY WITH RETROGRADE PYELOGRAM, URETEROSCOPY AND STENT PLACEMENT (Right )     Patient location during evaluation: PACU Anesthesia Type: General Level of consciousness: awake and alert Pain management: pain level controlled Vital Signs Assessment: post-procedure vital signs reviewed and stable Respiratory status: spontaneous breathing, nonlabored ventilation and respiratory function stable Cardiovascular status: blood pressure returned to baseline and stable Postop Assessment: no apparent nausea or vomiting Anesthetic complications: no    Last Vitals:  Vitals:   04/14/19 0930 04/14/19 0945  BP: 100/70 113/68  Pulse: 65 78  Resp: 15 20  Temp:  36.6 C  SpO2: 98% 92%    Last Pain:  Vitals:   04/14/19 0945  TempSrc:   PainSc: 0-No pain                 Lowella Curb

## 2019-04-14 NOTE — Anesthesia Procedure Notes (Signed)
Procedure Name: Intubation Date/Time: 04/14/2019 8:42 AM Performed by: Niel Hummer, CRNA Pre-anesthesia Checklist: Patient identified, Emergency Drugs available, Suction available and Patient being monitored Patient Re-evaluated:Patient Re-evaluated prior to induction Oxygen Delivery Method: Circle system utilized Preoxygenation: Pre-oxygenation with 100% oxygen Induction Type: IV induction Ventilation: Oral airway inserted - appropriate to patient size and Two handed mask ventilation required Laryngoscope Size: Mac and 4 Grade View: Grade II Tube type: Oral Tube size: 7.5 mm Number of attempts: 1 Airway Equipment and Method: Stylet Placement Confirmation: ETT inserted through vocal cords under direct vision,  positive ETCO2 and breath sounds checked- equal and bilateral Secured at: 23 cm Tube secured with: Tape Dental Injury: Teeth and Oropharynx as per pre-operative assessment  Comments: LMA 4 Proseal placed. Unable to seat. LMA 5 placed, unable to seat. Two hand Mask ventilation. Succs given. DL x1 with MAC 4, grade 2 view. ETT passed easily.

## 2019-04-14 NOTE — Transfer of Care (Signed)
Immediate Anesthesia Transfer of Care Note  Patient: [name]  Procedure(s) Performed: CYSTOSCOPY WITH RETROGRADE PYELOGRAM, URETEROSCOPY AND STENT PLACEMENT (Right )  Patient Location: PACU  Anesthesia Type:General  Level of Consciousness: awake, alert  and oriented  Airway & Oxygen Therapy: Patient Spontanous Breathing and Patient connected to face mask oxygen  Post-op Assessment: Report given to RN, Post -op Vital signs reviewed and stable and Patient moving all extremities X 4  Post vital signs: Reviewed and stable  Last Vitals:  Vitals Value Taken Time  BP    Temp    Pulse    Resp 21 04/14/19 0925  SpO2    Vitals shown include unvalidated device data.  Last Pain:  Vitals:   04/14/19 0749  TempSrc:   PainSc: 5       Patients Stated Pain Goal: 4 (04/14/19 0749)  Complications: No apparent anesthesia complications

## 2019-04-14 NOTE — H&P (Signed)
CC/HPI: CC: Right hydronephrosis, hydroureter  HPI:  64 year old male with history nephrolithiasis. He has undergone basket extraction as well as open pyelolithotomy on the right. He was recently having some right lower quadrant pain that is intermittent in nature. He underwent a CT scan of the abdomen and pelvis with contrast on 03/19/2019. 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 Creatinine is 0.8. Pain is mild. Left kidney appears normal. He is not currently interested in treatment of this and is more interested in treatment of his inguinal hernia.   He also complains about penile shortening and curvature. He has a suprapubic fat pad and also has a history of penile trauma. Since then, he has had shortened penis as well as a mild curvature. The curvature is only about 20 and is not severe.   He states that he is getting his prostate checked by Dr. little. He requested that I would not do that today.     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   Tobacco Use Assessment Completed: Used Tobacco in last 30 days? Drinks 1 drink per day.  Drinks 1 caffeinated drink per day.    REVIEW OF SYSTEMS:    GU Review  Male:   Patient denies have to strain to urinate , get up at night to urinate, hard to postpone urination, stream starts and stops, erection problems, trouble starting your stream, penile pain, leakage of urine, burning/ pain with urination, and frequent urination.  Gastrointestinal (Upper):   Patient denies nausea, vomiting, and indigestion/ heartburn.  Gastrointestinal (Lower):   Patient denies diarrhea and constipation.  Constitutional:   Patient denies fever, night sweats, weight loss, and fatigue.  Skin:   Patient denies skin rash/ lesion and itching.  Eyes:   Patient denies blurred vision and double vision.  Ears/ Nose/ Throat:   Patient denies sore throat and sinus problems.  Hematologic/Lymphatic:   Patient denies swollen glands and easy bruising.  Cardiovascular:   Patient denies leg swelling and chest pains.  Respiratory:   Patient denies cough and shortness of breath.  Endocrine:   Patient denies excessive thirst.  Musculoskeletal:   Patient denies back pain and joint pain.  Neurological:   Patient denies headaches and dizziness.  Psychologic:   Patient denies depression and anxiety.   VITAL SIGNS:      03/25/2019 02:29 PM  Weight 276 lb / 125.19 kg  Height 75 in / 190.5 cm  BP 119/76 mmHg  Heart Rate 75 /min  Temperature 97.5 F / 36.3 C  BMI 34.5 kg/m   GU PHYSICAL EXAMINATION:    Testes: No tenderness, no swelling, no enlargement left testes. No tenderness, no swelling, no enlargement right testes. Normal location left testes. Normal location right testes. No  mass, no cyst, no varicocele, no hydrocele left testes. No mass, no cyst, no varicocele, no hydrocele right testes.  Penis: Penis uncircumcised. No foreskin warts, no cracks. No dorsal peyronie's plaques, no left corporal peyronie's plaques, no right corporal peyronie's plaques, no scarring, no shaft warts. No balanitis, no meatal stenosis.    MULTI-SYSTEM PHYSICAL EXAMINATION:    Gastrointestinal: No mass, no  tenderness, no rigidity, obese abdomen. No CVA tenderness bilaterally     PAST DATA REVIEWED:  Source Of History:  Patient  Records Review:   Previous Doctor Records, Previous Patient Records  Urine Test Review:   Urinalysis  X-Ray Review: C.T. Abdomen/Pelvis: Reviewed Films. Reviewed Report. Discussed With Patient.     PROCEDURES:          Urinalysis - 81003 Dipstick Dipstick Cont'd  Color: Yellow Bilirubin: Neg  Appearance: Clear Ketones: Neg  Specific Gravity: 1.025 Blood: Neg  pH: 6.5 Protein: Trace  Glucose: Neg Urobilinogen: 0.2    Nitrites: Neg    Leukocyte Esterase: Neg    Notes:      ASSESSMENT:      ICD-10 Details  1 GU:   Hydronephrosis - N13.0 Undiagnosed New Problem  2   History of urolithiasis - Z87.442 Chronic, Stable   PLAN:           Schedule Return Visit/Planned Activity: 3 Months - Follow up MD          Document Letter(s):  Created for Patient: Clinical Summary         Notes:   I recommended diagnostic ureteroscopy with possible balloon dilation, possible ureteral biopsy and ureteral stent placement. He declined at this time. I will check in on him in 3 months and see if he has changed his mind. He will call if he changes his mind. He is more interested in treating his inguinal hernia. I think this is reasonable given he has had no hematuria, pain is minimal, hydronephrosis is relatively chronic, and creatinine is normal.   CC: Dr. Clarene Duke   Signed by Modena Slater, III, M.D. on 03/25/19 at 3:07 PM (EST

## 2019-04-14 NOTE — Anesthesia Preprocedure Evaluation (Signed)
Anesthesia Evaluation  Patient identified by MRN, date of birth, ID band Patient awake    Reviewed: Allergy & Precautions, NPO status , Patient's Chart, lab work & pertinent test results  Airway Mallampati: II  TM Distance: >3 FB Neck ROM: Full    Dental  (+) Teeth Intact, Dental Advisory Given   Pulmonary sleep apnea ,    Pulmonary exam normal breath sounds clear to auscultation       Cardiovascular negative cardio ROS Normal cardiovascular exam Rhythm:Regular Rate:Normal     Neuro/Psych Parkinson's disease   Neuromuscular disease (ulnar nerve lesion) negative neurological ROS     GI/Hepatic negative GI ROS, Neg liver ROS,   Endo/Other  Obesity   Renal/GU negative Renal ROS     Musculoskeletal negative musculoskeletal ROS (+)   Abdominal (+) + obese,   Peds  Hematology negative hematology ROS (+)   Anesthesia Other Findings Day of surgery medications reviewed with the patient.  Parkinson's Disease  Reproductive/Obstetrics                             Anesthesia Physical  Anesthesia Plan  ASA: III  Anesthesia Plan: General   Post-op Pain Management:    Induction: Intravenous  PONV Risk Score and Plan: 2 and Ondansetron, Midazolam and Treatment may vary due to age or medical condition  Airway Management Planned: LMA  Additional Equipment:   Intra-op Plan:   Post-operative Plan: Extubation in OR  Informed Consent: I have reviewed the patients History and Physical, chart, labs and discussed the procedure including the risks, benefits and alternatives for the proposed anesthesia with the patient or authorized representative who has indicated his/her understanding and acceptance.     Dental advisory given  Plan Discussed with:   Anesthesia Plan Comments:         Anesthesia Quick Evaluation

## 2019-04-20 ENCOUNTER — Ambulatory Visit (INDEPENDENT_AMBULATORY_CARE_PROVIDER_SITE_OTHER): Payer: Managed Care, Other (non HMO) | Admitting: Neurology

## 2019-04-20 ENCOUNTER — Encounter: Payer: Self-pay | Admitting: Neurology

## 2019-04-20 ENCOUNTER — Other Ambulatory Visit: Payer: Self-pay

## 2019-04-20 VITALS — BP 125/75 | HR 73 | Temp 96.8°F | Ht 75.0 in | Wt 275.0 lb

## 2019-04-20 DIAGNOSIS — K5909 Other constipation: Secondary | ICD-10-CM

## 2019-04-20 DIAGNOSIS — Z9689 Presence of other specified functional implants: Secondary | ICD-10-CM

## 2019-04-20 DIAGNOSIS — G4733 Obstructive sleep apnea (adult) (pediatric): Secondary | ICD-10-CM

## 2019-04-20 DIAGNOSIS — Z9989 Dependence on other enabling machines and devices: Secondary | ICD-10-CM

## 2019-04-20 DIAGNOSIS — G20 Parkinson's disease: Secondary | ICD-10-CM | POA: Diagnosis not present

## 2019-04-20 NOTE — Progress Notes (Signed)
Subjective:    Patient ID: [name] is a 64 y.o. male.  HPI     Interim history:  [name] is a 63 year old right-handed gentleman with an underlying history of hyperlipidemia and kidney stones, s/p DBS for PD, who presents for followup consultation of his right-sided predominant Parkinson's disease, as well as obstructive sleep apnea on AutoPap therapy.  The patient is unaccompanied today. I last saw [name] on 12/21/18, at which time [name] was compliant with his AutoPap.  [name] reported doing okay with it.  [name] had ongoing issues with lower extremity swelling.  [name] was advised to exercise on a more consistent basis.  [name] had interim problems with hernia and reported that [name] may need surgery.  [name] ended up having right hydronephrosis and required a stent placement on 04/14/2019.   [name] had an interim appointment with [facility] for programming on 12/30/2018 and a subsequent appointment with Dr. Deboraha Sprang on 01/20/2019 and I reviewed the office note.   Today, 04/20/2019: I reviewed his AutoPap compliance data from 03/20/2019 through 04/18/2019 which is a total of 30 days, during which time [name] used his machine 27 days with percent use days greater than 4 hours at 83%, indicating very good compliance with an average usage of 5 hours and 35 minutes for days on treatment, residual AHI at goal at 0.5/h, 95th percentile pressure at 8.3 cm with a range of 7 cm to 13 cm with EPR, leak acceptable with a 95th percentile at 10.3 L/min.  [name] reports overall feeling slower, [name] has had a more noticeable tremor on the left side.  His balance sometimes is off, [name] has not fallen.  [name] has lost a little bit of weight.  [name] is working on weight loss.  [name] was exercising until [name] started having pain, [name] is recuperating from his recent ureteral stent placement and it was removed 5 days later.  [name] has not had hernia surgery and is still in 2 minds about it.  [name] does not immediately require hernia surgery.  His programming appointment in November  2020 and the subsequent tweaking of his settings in December 2020 at [facility] resulted in no significant improvement in his symptoms [name] feels.  [name] is not quite ready to pursue his second DBS, feels that [name] probably will have it next year.  Constipation is an intermittent problem.  [name] tries to hydrate better with water.  No new complaints overall, continues to be compliant with his AutoPap.  Feels that [name] has adapted well to it but does not love it.  The patient's allergies, current medications, family history, past medical history, past social history, past surgical history and problem list were reviewed and updated as appropriate.    Previously (copied from previous notes for reference):    I saw [name] in a virtual visit on 07/30/2018, at which time [name] was compliant with his AutoPap.  I suggested we continue with his medication regimen including Azilect 1 mg once daily, Sinemet 1-1/2 pills 4 times a day and Neupro patch 4 mg daily.     I reviewed his AutoPap compliance data from 11/17/2018 through 12/16/2018 which is a total of 30 days, during which time [name] used his machine 27 days with percent use days greater than 4 hours at 80%, indicating very good compliance with an average usage of 5 hours and 58 minutes, residual AHI at goal at 0.7/h, leak high with a 95th percentile at 29.5 L/min on a pressure range of 7  cm to 13 cm with EPR, 95th percentile of pressure at 8.4 cm.       I saw [name] on 11/06/2017, at which time [name] felt stable from the Parkinson's standpoint.  [name] had recently seen Dr. Linus Mako at [facility] in August and programming was kept the same.  His lower extremity swelling had gotten worse.     [name] emailed in December and in early January 2020 we reduced his Neupro patch from 6 mg to 4 mg daily.   [name] saw Cecille Rubin, nurse practitioner in the interim on 03/31/2018 for his first AutoPap visit after his home sleep test and [name] was adequate with his AutoPap compliance at the time.    I  reviewed his AutoPap compliance data from 06/29/2018 through 07/28/2018 which is a total of 30 days, during which time [name] used his machine 26 days with percent used days greater than 4 hours at 77%, indicating good compliance with an average usage of 4 hours and 43 minutes only, residual AHI at goal at 1/h, 95th percentile of pressure at 8.3 cm, leak on the higher side with a 95th percentile at 18.3 L/min on a pressure range of 7 cm to 13 cm with EPR.    I saw [name] on 05/06/2017, at which time [name] was doing well, walking and tremor were since his first programming appointment on 04/18/2017. Of note, [name] had his left DBS surgery on 04/04/2017 under Dr. Salomon Fick. [name] has had a very good experience overall from beginning to end.      I saw [name] on 11/05/2016, at which time [name] reported no significant repercussions after reducing the Neupro and increasing the Sinemet. His swelling had improved a little bit. [name] was still in the process of being evaluated for DBS surgery. I suggested we keep his medication regimen the same. We talked about the importance of losing weight especially in preparation for elective surgery.     I saw [name] on 07/04/2016, at which time [name] reported doing okay, was planning to pursue DBS surgery perhaps later this year. [name] was quite apprehensive. [name] had not scheduled his psychological evaluation or surgical consultation. [name] was overall considered a good candidate for DBS treatment per neurology. [name] was more on time with his Sinemet and noticed improvement in his symptoms, lower extremity swelling was slightly worse. Neupro was working well at 8 mg, but d/t swelling, I suggested with reduce it. I also suggested we increase his Sinemet to 1-1/2 pills for times a day in lieu of keeping the Neupro at 8 mg daily. [name] was no longer taking gabapentin and his foot pain was indeed a little bit better with time, gabapentin made [name] too sleepy.    I saw [name] on 03/05/2016, at which time [name] reported taking C/L 4  times a day, but not always all 4 doses, as [name] would forget at times. Low-dose gabapentin prn was helping his foot pain. No recent falls, no recent mood or memory issues, no word on the referral from [facility]. Had a recent cold and congestion. [name] was advised to continue with the current management, including Sinemet 1 pill 4 times a day, Azilect 1 mg once daily, Neupro patch 8 mg once daily, gabapentin 300 mg at night as needed.   I resubmitted referral to [facility] for DBS Eval and patient was seen in the interim by Dr. Linus Mako on 05/24/16. I reviewed the note.     I saw [name] on 12/04/2015, at which time [name] reported  ongoing issues with his right foot including status post surgery in April 2017 for hammertoes but [name] had ongoing issues with pain and more stiffness. [name] felt overall that his right side was more stiff and tremulous. Since starting Sinemet [name] had noted no telltale difference in his symptoms. [name] had consulted with different podiatrists and had tried orthotics. Mood and memory were stable. [name] was not sleeping very well. We talked about DBS treatment in more detail. I made a referral to Dr. Linus Mako at [facility]. I also increased his Sinemet to 1 pill 4 times a day. I suggested we try [name] on low-dose gabapentin for his right foot pain.   I saw [name] on 08/01/2015, at which time [name] reported feeling worse. [name] had more difficulty exercising, [name] had gained weight. [name] had foot surgery and his balance had been worse since then. [name] had hammertoe surgery on 05/25/2015. Because of decrease in mobility after his surgery his weight increase. His tremor was worse. Memory was stable. I suggested we continue with Neupro 8 mg patch daily. I suggested [name] continue with Azilect once daily. I suggested we start [name] on the rytary 95 mg strength with gradual titration. However, [name] called in July reporting that the medication was too expensive, I therefore, switched [name] to Sinemet generic. [name] also requested a handicap placard form to  be filled out which I provided in September.   Of note, [name] missed an appointment on 06/20/2015 due to being sick. I saw [name] on 12/01/2014, at which time [name] reported a change in his insurance, mood and memory were stable, lower extremity swelling was stable, we kept his Neupro patch at 8 mg an Azilect 1 mg once daily the same. [name] was working full-time, also active with his 2 teenage boys, 62 year old an 63 year old. His wife had a recent change in her job.    I saw [name] on 08/02/2014, at which time [name] reported doing well. [name] had some leg swelling. His right hand swelling was a little worse. Cognitively and mood wise [name] was stable. Balance may have been off at times but generally speaking [name] was doing well.    I saw [name] on 04/05/2014, at which time [name] reported feeling fairly stable but his wife felt his balance was not as good. [name] was working out with a Clinical research associate. [name] had picked up boxing. [name] was working full-time. [name] cut back on his sodas. I kept [name] on his medications, Neupro patch 6 mg and Azilect 1 mg. [name] had some ongoing issues with lower extremity swelling and I referred [name] to vascular surgery for consultation. [name] was seen by a vascular specialist, Dr. Scot Dock on 04/13/2014. [name] had Doppler studies to his lower extremities and was advised [name] had no DVT and good arterial flow. His swelling improved.    I saw [name] on 12/03/2013, at which time [name] reported doing well and had noted a benefit from Neupro patch 6 mg. I continued [name] on this dose as well as Azilect 1 mg once daily. His exam was stable.   I saw [name] on 07/28/2013, at which time [name] reported some worsening of his tremor and his gait. [name] was able to tolerate neupro patch 4 mg strength. [name] has had some skin irritation, most likely from the adhesive. [name] was not exercising regularly but endorsed being active and working full-time. His memory was stable. [name] denies any impulse control disorder but had mild daytime somnolence which was not as severe as when  [name]  was on Requip long-acting.     I saw [name] on 02/05/2013, at which time I felt his physical exam was a little worse since stopping Requip XL. [name] had stopped this because of daytime somnolence and severe nausea. I suggested we start [name] on Neupro patch. I provided [name] with samples. We talked about doing a sleep study down the [place].     I first met [name] on 07/15/2012, at which time I suggested [name] start taking coenzyme Q 10. I also suggested that [name] switch his Requip XL 4 mg to nighttime because of report of daytime somnolence. I did not increase make any other changes to his medications and felt that [name] was overall stable.     [name] previously followed with Dr. Jeneen Rinks love and was last seen by [name] on 03/17/2012 at which time Dr. Erling Cruz increase his Requip XL and continued [name] on Azilect. [name] was diagnosed in 12/2008, and Sx go back to a year prior to that.   MRI brain without contrast was done in the past. [name] has been on rasagiline, which improved his tremor. 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 There is no family history of tremor. There is no history of REM behavior disorder. [name] has had no falls, or hallucinations or involuntary movements otherwise. [name] exercises not very regularly. His memory and mood have been stable. [name] has no problems with lower extremities swelling or compulsive thoughts or gambling. 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    His Past Medical History Is Significant For: Past Medical History:  Diagnosis Date  . Calculus of kidney   . Lesion of ulnar nerve   . Paralysis agitans ([place])   . Parkinson's disease ([place])   . Sleep apnea    cpap - setting at 7   . Sleep disturbance, unspecified     His Past Surgical History Is Significant For: Past Surgical History:  Procedure Laterality Date  . CYSTOSCOPY WITH RETROGRADE PYELOGRAM, URETEROSCOPY AND STENT PLACEMENT Right 04/14/2019   Procedure: CYSTOSCOPY WITH RETROGRADE PYELOGRAM,  URETEROSCOPY AND STENT PLACEMENT;  Surgeon: Lucas Mallow, MD;  Location: [facility];  Service: Urology;  Laterality: Right;  . DBS surgery     . HAMMERTOE RECONSTRUCTION WITH WEIL OSTEOTOMY Right 05/25/2015   Procedure: RIGHT SECOND TOE METATARSAL WEIL OSTEOTOMY  HAMMERTOE CORRECTION COLLATERAL LIGAMENT REPAIR; FLEXOR TO EXTENSOR TRANSFER ;  Surgeon: Wylene Simmer, MD;  Location: [place];  Service: Orthopedics;  Laterality: Right;  . kidney stones  1991    His Family History Is Significant For: Family History  Problem Relation Age of Onset  . Heart failure Father   . Cancer Father   . Diabetes Father   . Sleep apnea Mother   . Sleep apnea Brother     His Social History Is Significant For: Social History   Socioeconomic History  . Marital status: Married    Spouse name: Juliann Pulse   . Number of children: 2  . Years of education: Not on file  . Highest education level: Bachelor's degree (e.g., BA, AB, BS)  Occupational History  . Not on file  Tobacco Use  . Smoking status: Never Smoker  . Smokeless tobacco: Never Used  Substance and Sexual Activity  . Alcohol use: Yes    Alcohol/week: 3.0 standard drinks    Types: 3 Standard drinks or equivalent per week    Comment: social  . Drug use: No  .  Sexual activity: Not on file  Other Topics Concern  . Not on file  Social History Narrative   Consumes 1 soda a day    Social Determinants of Health   Financial Resource Strain:   . Difficulty of Paying Living Expenses: Not on file  Food Insecurity:   . Worried About Charity fundraiser in the Last Year: Not on file  . Ran Out of Food in the Last Year: Not on file  Transportation Needs:   . Lack of Transportation (Medical): Not on file  . Lack of Transportation (Non-Medical): Not on file  Physical Activity:   . Days of Exercise per Week: Not on file  . Minutes of Exercise per Session: Not on file  Stress:   . Feeling of Stress : Not on file  Social Connections:    . Frequency of Communication with Friends and Family: Not on file  . Frequency of Social Gatherings with Friends and Family: Not on file  . Attends Religious Services: Not on file  . Active Member of Clubs or Organizations: Not on file  . Attends Archivist Meetings: Not on file  . Marital Status: Not on file    His Allergies Are:  No Known Allergies:   His Current Medications Are:  Outpatient Encounter Medications as of 04/20/2019  Medication Sig  . carbidopa-levodopa (SINEMET IR) 25-100 MG tablet Take 1.5 tablets by mouth 4 (four) times daily. At 8, 12, 4 PM and 8 PM  . HYDROcodone-acetaminophen (NORCO) 5-325 MG tablet Take 1 tablet by mouth every 4 (four) hours as needed for moderate pain.  Marland Kitchen NEUPRO 4 MG/24HR PLACE 1 PATCH ONTO SKIN DAILY ( IN LIEU OF '6MG'$  CHANGE IN DOSE) (Patient taking differently: Place 1 patch onto the skin daily. )  . rasagiline (AZILECT) 1 MG TABS tablet Take 1 tablet (1 mg total) by mouth daily.   No facility-administered encounter medications on file as of 04/20/2019.  :  Review of Systems:  Out of a complete 14 point review of systems, all are reviewed and negative with the exception of these symptoms as listed below: Review of Systems  Neurological:       Here for 3 month f/u. Reports [name] has been doing ok. Noted some worsening in his sx.     Objective:  Neurological Exam  Physical Exam Physical Examination:   Vitals:   04/20/19 0920  BP: 125/75  Pulse: 73  Temp: (!) 96.8 F (36 C)    General Examination: The patient is a very pleasant 64 y.o. male in no acute distress. [name] appears well-developed and well-nourished and well groomed.   HEENT:Normocephalic, unremarkable scar left parietal scalp from DBS. Pupils are equal, round and reactive to light, extraocular tracking shows mild saccadic breakdown without nystagmus noted. There is no limitation to his gaze. There is mild decrease in eye blink rate. Hearing is intact. Face is symmetric  with moderatefacial masking, no lip, neck or jaw tremor. Oropharynx exam revealsmoderate airway crowding.There is no sialorrhea.   Chest:is clear to auscultation without wheezing, rhonchi or crackles noted.  Heart:sounds are regular and normal without murmurs, rubs or gallops noted.   Abdomen:is soft, non-tender and non-distended with normal bowel sounds appreciated on auscultation.  Extremities:There1-2+ pitting edema in the distal lower extremities bilaterally, improved from last exam.    Skin: is warm and dry with no trophic changes noted.Chronic swelling type changes and mild redness in both distal lower extremities, better.  Musculoskeletal: exam reveals nonew findings.([name]  is status post right second toe hammertoe repair).  Neurologically:  Mental status: The patient is awake and alert, paying good attention. [name] is able to completely provide the history. [name] is oriented to: person, place, time/date, situation, day of week, month of year and year. His memory, attention, language and knowledge are intact. There is no aphasia, agnosia, apraxia or anomia. There is perhaps mild bradyphrenia. Speech is mild to moderately hypophonic with no dysarthria noted. Mood is congruent and affect is normal.   Cranial nerves are as described above under HEENT exam. In addition, shoulder shrug is normal, but right shoulder is a little higher than left shoulder, stable findings.  Motor exam: Normal bulk, and strength for age is noted. There are no dyskinesias noted.  Tone is mild to moderately rigid with presence of cogwheeling in the right upper extremity and RLE. There is overall moderate bradykinesia. There is no drift or rebound.  There is an intermittent mild resting tremor in the LUE.   Fine motor skills exam:moderate impairment noted on the right, mild to moderate impairment on the left.   Cerebellar testing shows no dysmetria or intention tremor on finger to nose testing.  Heel to shin is unremarkable bilaterally. There is no truncal or gait ataxia.   Sensory exam is intact to light touch in both upper and lower extremities.  Gait, station and balance: [name] stands up from the seated position with no significant difficulty but does need to push up with His hands. [name] needs no assistance. No veering to one side is noted.Posture fairly good today, no obvious limp today. Walks with decreased arm swing bilaterally, right more so than left.  Assessment and Plan:   In summary, TREVONE PRESTWOOD is a very pleasant50 year old male with an underlying history of hyperlipidemia and kidney stones, s/p L DBS for PD, who presents for followup consultation of his right-sided predominant Parkinson's disease, as well as obstructive sleep apnea on AutoPap therapy. [name] was diagnosed with Parkinson's disease in 2010, symptoms date back to 2009. [name] had no telltale history of RBD, hadsome vivid dreamsin thepast. [name] is status post left STN DBS placement in February 2019, and has done quite well after that. [name] continues to be on Neupro patch. We reduced the patch to 6 mg strength in May 2018 for lower extremity swelling. I suggested we reduce his Neupro patch further to 4 mg in early 2020. His home sleep test from 12/23/2017 showed severe obstructive sleep apnea with an AHI of 46.9/h, O2 nadir of 84%.  [name] has established treatment on AutoPap.  [name] is compliant with treatment and commended for it. [name] has a family history of obstructive sleep apnea in his mother and brother. [name] had interim programming appointments and a follow-up with Dr. Deboraha Sprang in November 2020 in December 2020, respectively.  [name] has had more trouble with slowness and more noticeable tremor on the left side.  Exam is fairly stable today but when we reduce the Neupro to 4 mg last year we did not really adjust any other medication.  [name] had a recent ureteral stent placed and removed successfully.  [name] has a remote history of kidney  stones in the 90s.  I suggested we increase his Sinemet to 1-1/2 pills 5 times a day, at 8, 11, 2 PM, 5 PM and 8 PM.  [name] will continue with Neupro patch 4 mg daily and Azilect 1 mg once daily.  [name] is advised that for any elective surgery I would recommend  [name] taper off the Azilect temporarily and be off of it 1 or 2 weeks prior to surgery and [name] can resume it after surgery so long as [name] is no longer on any narcotic pain medication.  [name] is agreeable.  [name] is not going to have any surgery in the near future, [name] may need hernia surgery down the road and is also anticipating his second DBS surgery for next year.  We talked about the importance of good hydration, daily exercise and being proactive about constipation.  [name] is advised to follow-up routinely in 4 months, sooner if needed.  [name] is encouraged to call our office or email through [place] in the next 3 weeks to see if [name] wants to continue with the Sinemet at the dose of 1-1/2 pills 5 times a day.  I answered all his questions today and [name] was in agreement with the plan. I spent 30 minutes in total face-to-face time and in reviewing records during pre-charting, more than 50% of which was spent in counseling and coordination of care, reviewing test results, reviewing medications and treatment regimen and/or in discussing or reviewing the diagnosis of PD, the prognosis and treatment options. Pertinent laboratory and imaging test results that were available during this visit with the patient were reviewed by me and considered in my medical decision making (see chart for details).

## 2019-04-20 NOTE — Patient Instructions (Signed)
As discussed, we will increase her Sinemet to 1-1/2 pills 5 times a day, at 8, 11, 2 PM, 5 PM and 8 PM.  You are very compliant with your AutoPap, keep up the good work and continue using your AutoPap machine.  Please continue to work on weight loss and exercise regularly.  Please stay Well-hydrated with water.  Continue to be proactive about constipation issues.  As discussed, when we reduced your Neupro patch from 6 mg daily to 4 mg daily, we did not adjust any of your other medications, we may be seeing the repercussions of reducing your dopamine agonist.  Since your leg swelling is better, I would recommend that we continue with the Neupro patch at the current dose of 4 mg.  If the Sinemet at the New dose of 1-1/2 pills 5 times a day works out well for you, please let me know when I will adjust your prescription for the new dose. Call or email through MyChart in about 3 weeks.  Please continue to follow with Valentina Shaggy and Dr. Westley Hummer as scheduled, follow-up with me in 4 months.  If you decide to have elective hernia surgery, please discontinue Azilect 1 mg strength by reducing to half pill x1 week and stop completely 1 to 2 weeks before elective surgery.  Azilect has been reported in some case reports to interact with anesthesia pharmaceuticals as well as meperidine and narcotic pain medications. It can enhance adverse effects of hydrocodone, oxycodone, oxymorphone, hydromorphone, etc. You can resume the Azilect 3 days after surgery - so long as you are no longer on Narcotic pain medication - by starting half a pill once daily x 1 week and then 1 mg once daily thereafter. Keep in mind, that, generally speaking, patients with Parkinson's disease can have perioperative difficulty with motor fluctuations (ie, increase in tremor, slowness, stiffness), due to changes in their medication regimen, changes in the schedule altogether, and swallowing difficulty and be at higher risk for freezing, of times,  balance issues and falls, and aspiration. Parkinson's patients can also be at higher risk of developing hallucinations secondary to pain medication, such as after elective surgery, requiring postoperative narcotic pain medication.

## 2019-04-28 ENCOUNTER — Encounter: Payer: Self-pay | Admitting: Neurology

## 2019-05-26 ENCOUNTER — Encounter: Payer: Self-pay | Admitting: Neurology

## 2019-08-22 ENCOUNTER — Encounter: Payer: Self-pay | Admitting: Neurology

## 2019-08-23 ENCOUNTER — Ambulatory Visit (INDEPENDENT_AMBULATORY_CARE_PROVIDER_SITE_OTHER): Payer: Managed Care, Other (non HMO) | Admitting: Neurology

## 2019-08-23 ENCOUNTER — Other Ambulatory Visit: Payer: Self-pay

## 2019-08-23 ENCOUNTER — Encounter: Payer: Self-pay | Admitting: Neurology

## 2019-08-23 VITALS — BP 122/76 | HR 84 | Ht 75.0 in | Wt 279.0 lb

## 2019-08-23 DIAGNOSIS — G20 Parkinson's disease: Secondary | ICD-10-CM | POA: Diagnosis not present

## 2019-08-23 DIAGNOSIS — G2581 Restless legs syndrome: Secondary | ICD-10-CM | POA: Diagnosis not present

## 2019-08-23 DIAGNOSIS — Z9989 Dependence on other enabling machines and devices: Secondary | ICD-10-CM | POA: Diagnosis not present

## 2019-08-23 DIAGNOSIS — G4733 Obstructive sleep apnea (adult) (pediatric): Secondary | ICD-10-CM

## 2019-08-23 MED ORDER — NEUPRO 4 MG/24HR TD PT24: 1.0000 | MEDICATED_PATCH | Freq: Every day | TRANSDERMAL | 3 refills | Status: DC

## 2019-08-23 MED ORDER — GABAPENTIN 100 MG PO CAPS: 100.0000 mg | ORAL_CAPSULE | Freq: Every evening | ORAL | 3 refills | Status: DC | PRN

## 2019-08-23 MED ORDER — CARBIDOPA-LEVODOPA 25-100 MG PO TABS: 1.5000 | ORAL_TABLET | Freq: Four times a day (QID) | ORAL | 3 refills | Status: DC

## 2019-08-23 MED ORDER — RASAGILINE MESYLATE 1 MG PO TABS: 1.0000 mg | ORAL_TABLET | Freq: Every day | ORAL | 3 refills | Status: DC

## 2019-08-23 NOTE — Progress Notes (Signed)
Subjective:    Patient ID: [name] is a 64 y.o. male.  HPI     Interim history:  Mr. [name] is a 63-year-old right-handed gentleman with an underlying history of hyperlipidemia and kidney stones, s/p DBS for PD, who presents for followup consultation of his right-sided predominant Parkinson's disease, and OSA on AutoPap therapy.  The patient is unaccompanied today. I last saw him on 04/20/2019, at which time I suggested he increase his Sinemet.  Today, 08/23/2019: He reports feeling better, Programming appointment from last month at [facility] helped, he is not on any new medication other than omeprazole for reflux.  He had an upper and lower GI endoscopy and was told he had some inflammatory changes in the upper GI tract.  He had some indigestion type symptoms.  He reports that he tried increasing the Sinemet but is still on 1-1/2 pills 4 times daily.  He was encouraged to try again at a higher dose at his last appointment with Jennifer Wood, nurse practitioner at [facility] but he ended up not trying it as he felt that the programming really helped.  He is considering his second DBS next year.  He has been able to play golf and in fact for the first time in over 3 years was able to play 18 holes.  He is overall quite pleased with how he is doing, works some but has filed for disability.  He reports that he has had some trouble sleeping, has tried melatonin but not in over a month, he was using the Gummies.  He continues to use his AutoPap.  He is compliant with using, I reviewed the compliance data from 07/24/2019 through 08/22/2019, during which time his compliance for over 4 hours was 67%, AHI on average 0.8/h, average pressure at 8.1 cm, leak on the high side.  He also reports flareup and restless leg symptoms for the past several months, at least 6 months. last slower, he has had a more noticeable tremor on the left side.  His balance sometimes is off, he has not fallen.  He has lost a little bit of  weight.  He is working on weight loss.  He was exercising until he started having pain, he is recuperating from his recent ureteral stent placement and it was removed 5 days later.  He has not had hernia surgery and is still in 2 minds about it.  He does not immediately require hernia surgery.  His programming appointment in November 2020 and the subsequent tweaking of his settings in December 2020 at [facility] resulted in no significant improvement in his symptoms he feels.  He is not quite ready to pursue his second DBS, feels that he probably will have it next year.  Constipation is an intermittent problem.  He tries to hydrate better with water.  No new complaints overall, continues to be compliant with his AutoPap.  Feels that he has adapted well to it but does not love it.   The patient's allergies, current medications, family history, past medical history, past social history, past surgical history and problem list were reviewed and updated as appropriate.    Previously (copied from previous notes for reference):      I saw him on 12/21/18, at which time he was compliant with his AutoPap.  He reported doing okay with it.  He had ongoing issues with lower extremity swelling.  He was advised to exercise on a more consistent basis.  He had interim problems with   hernia and reported that he may need surgery.  He ended up having right hydronephrosis and required a stent placement on 04/14/2019.    He had an interim appointment with [facility] for programming on 12/30/2018 and a subsequent appointment with Dr. Siddiqi on 01/20/2019 and I reviewed the office note.    I reviewed his AutoPap compliance data from 03/20/2019 through 04/18/2019 which is a total of 30 days, during which time he used his machine 27 days with percent use days greater than 4 hours at 83%, indicating very good compliance with an average usage of 5 hours and 35 minutes for days on treatment, residual AHI at goal at 0.5/h, 95th percentile  pressure at 8.3 cm with a range of 7 cm to 13 cm with EPR, leak acceptable with a 95th percentile at 10.3 L/min.     I saw him in a virtual visit on 07/30/2018, at which time he was compliant with his AutoPap.  I suggested we continue with his medication regimen including Azilect 1 mg once daily, Sinemet 1-1/2 pills 4 times a day and Neupro patch 4 mg daily.     I reviewed his AutoPap compliance data from 11/17/2018 through 12/16/2018 which is a total of 30 days, during which time he used his machine 27 days with percent use days greater than 4 hours at 80%, indicating very good compliance with an average usage of 5 hours and 58 minutes, residual AHI at goal at 0.7/h, leak high with a 95th percentile at 29.5 L/min on a pressure range of 7 cm to 13 cm with EPR, 95th percentile of pressure at 8.4 cm.       I saw him on 11/06/2017, at which time he felt stable from the Parkinson's standpoint.  He had recently seen Dr. Siddiqui at [facility] in August and programming was kept the same.  His lower extremity swelling had gotten worse.     He emailed in December and in early January 2020 we reduced his Neupro patch from 6 mg to 4 mg daily.   He saw Carolyn Martin, nurse practitioner in the interim on 03/31/2018 for his first AutoPap visit after his home sleep test and he was adequate with his AutoPap compliance at the time.    I reviewed his AutoPap compliance data from 06/29/2018 through 07/28/2018 which is a total of 30 days, during which time he used his machine 26 days with percent used days greater than 4 hours at 77%, indicating good compliance with an average usage of 4 hours and 43 minutes only, residual AHI at goal at 1/h, 95th percentile of pressure at 8.3 cm, leak on the higher side with a 95th percentile at 18.3 L/min on a pressure range of 7 cm to 13 cm with EPR.    I saw him on 05/06/2017, at which time he was doing well, walking and tremor were since his first programming appointment on 04/18/2017.  Of note, he had his left DBS surgery on 04/04/2017 under Dr. Tatter. He has had a very good experience overall from beginning to end.      I saw him on 11/05/2016, at which time he reported no significant repercussions after reducing the Neupro and increasing the Sinemet. His swelling had improved a little bit. He was still in the process of being evaluated for DBS surgery. I suggested we keep his medication regimen the same. We talked about the importance of losing weight especially in preparation for elective surgery.       I saw him on 07/04/2016, at which time he reported doing okay, was planning to pursue DBS surgery perhaps later this year. He was quite apprehensive. He had not scheduled his psychological evaluation or surgical consultation. He was overall considered a good candidate for DBS treatment per neurology. He was more on time with his Sinemet and noticed improvement in his symptoms, lower extremity swelling was slightly worse. Neupro was working well at 8 mg, but d/t swelling, I suggested with reduce it. I also suggested we increase his Sinemet to 1-1/2 pills for times a day in lieu of keeping the Neupro at 8 mg daily. He was no longer taking gabapentin and his foot pain was indeed a little bit better with time, gabapentin made him too sleepy.    I saw him on 03/05/2016, at which time he reported taking C/L 4 times a day, but not always all 4 doses, as he would forget at times. Low-dose gabapentin prn was helping his foot pain. No recent falls, no recent mood or memory issues, no word on the referral from [facility]. Had a recent cold and congestion. He was advised to continue with the current management, including Sinemet 1 pill 4 times a day, Azilect 1 mg once daily, Neupro patch 8 mg once daily, gabapentin 300 mg at night as needed.   I resubmitted referral to [facility] for DBS Eval and patient was seen in the interim by Dr. Siddiqui on 05/24/16. I reviewed the note.     I saw him on 12/04/2015, at  which time he reported ongoing issues with his right foot including status post surgery in April 2017 for hammertoes but he had ongoing issues with pain and more stiffness. He felt overall that his right side was more stiff and tremulous. Since starting Sinemet he had noted no telltale difference in his symptoms. He had consulted with different podiatrists and had tried orthotics. Mood and memory were stable. He was not sleeping very well. We talked about DBS treatment in more detail. I made a referral to Dr. Siddiqui at [facility]. I also increased his Sinemet to 1 pill 4 times a day. I suggested we try him on low-dose gabapentin for his right foot pain.   I saw him on 08/01/2015, at which time he reported feeling worse. He had more difficulty exercising, he had gained weight. He had foot surgery and his balance had been worse since then. He had hammertoe surgery on 05/25/2015. Because of decrease in mobility after his surgery his weight increase. His tremor was worse. Memory was stable. I suggested we continue with Neupro 8 mg patch daily. I suggested he continue with Azilect once daily. I suggested we start him on the rytary 95 mg strength with gradual titration. However, he called in July reporting that the medication was too expensive, I therefore, switched him to Sinemet generic. He also requested a handicap placard form to be filled out which I provided in September.   Of note, he missed an appointment on 06/20/2015 due to being sick. I saw him on 12/01/2014, at which time he reported a change in his insurance, mood and memory were stable, lower extremity swelling was stable, we kept his Neupro patch at 8 mg an Azilect 1 mg once daily the same. He was working full-time, also active with his 2 teenage boys, 17-year-old an 14-year-old. His wife had a recent change in her job.    I saw him on 08/02/2014, at which time he reported doing well. He   had some leg swelling. His right hand swelling was a little  worse. Cognitively and mood wise he was stable. Balance may have been off at times but generally speaking he was doing well.    I saw him on 04/05/2014, at which time he reported feeling fairly stable but his wife felt his balance was not as good. He was working out with a trainer. He had picked up boxing. He was working full-time. He cut back on his sodas. I kept him on his medications, Neupro patch 6 mg and Azilect 1 mg. He had some ongoing issues with lower extremity swelling and I referred him to vascular surgery for consultation. He was seen by a vascular specialist, Dr. Dickson on 04/13/2014. He had Doppler studies to his lower extremities and was advised he had no DVT and good arterial flow. His swelling improved.    I saw him on 12/03/2013, at which time he reported doing well and had noted a benefit from Neupro patch 6 mg. I continued him on this dose as well as Azilect 1 mg once daily. His exam was stable.   I saw him on 07/28/2013, at which time he reported some worsening of his tremor and his gait. He was able to tolerate neupro patch 4 mg strength. He has had some skin irritation, most likely from the adhesive. He was not exercising regularly but endorsed being active and working full-time. His memory was stable. He denies any impulse control disorder but had mild daytime somnolence which was not as severe as when he was on Requip long-acting.     I saw him on 02/05/2013, at which time I felt his physical exam was a little worse since stopping Requip XL. He had stopped this because of daytime somnolence and severe nausea. I suggested we start him on Neupro patch. I provided him with samples. We talked about doing a sleep study down the Road.     I first met him on 07/15/2012, at which time I suggested he start taking coenzyme Q 10. I also suggested that he switch his Requip XL 4 mg to nighttime because of report of daytime somnolence. I did not increase make any other changes to his  medications and felt that he was overall stable.     He previously followed with Dr. James love and was last seen by him on 03/17/2012 at which time Dr. Love increase his Requip XL and continued him on Azilect. He was diagnosed in 12/2008, and Sx go back to a year prior to that.   MRI brain without contrast was done in the past. He has been on rasagiline, which improved his tremor. 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 There is no family history of tremor. There is no history of REM behavior disorder. He has had no falls, or hallucinations or involuntary movements otherwise. He exercises not very regularly. His memory and mood have been stable. He has no problems with lower extremities swelling or compulsive thoughts or gambling. He works full-time as a real estate broker.    His Past Medical History Is Significant For: Past Medical History:  Diagnosis Date  . Calculus of kidney   . Lesion of ulnar nerve   . Paralysis agitans (HCC)   . Parkinson's disease (HCC)   . Sleep apnea    cpap - setting at 7   . Sleep disturbance, unspecified     His Past Surgical History Is Significant For:   Past Surgical History:  Procedure Laterality Date  . CYSTOSCOPY WITH RETROGRADE PYELOGRAM, URETEROSCOPY AND STENT PLACEMENT Right 04/14/2019   Procedure: CYSTOSCOPY WITH RETROGRADE PYELOGRAM, URETEROSCOPY AND STENT PLACEMENT;  Surgeon: Bell, Eugene D III, MD;  Location: [facility];  Service: Urology;  Laterality: Right;  . DBS surgery     . HAMMERTOE RECONSTRUCTION WITH WEIL OSTEOTOMY Right 05/25/2015   Procedure: RIGHT SECOND TOE METATARSAL WEIL OSTEOTOMY  HAMMERTOE CORRECTION COLLATERAL LIGAMENT REPAIR; FLEXOR TO EXTENSOR TRANSFER ;  Surgeon: John Hewitt, MD;  Location: [facility];  Service: Orthopedics;  Laterality: Right;  . kidney stones  1991    His Family History Is Significant For: Family History  Problem Relation Age of Onset  . Heart  failure Father   . Cancer Father   . Diabetes Father   . Sleep apnea Mother   . Sleep apnea Brother     His Social History Is Significant For: Social History   Socioeconomic History  . Marital status: Married    Spouse name: Kathy   . Number of children: 2  . Years of education: Not on file  . Highest education level: Bachelor's degree (e.g., BA, AB, BS)  Occupational History  . Not on file  Tobacco Use  . Smoking status: Never Smoker  . Smokeless tobacco: Never Used  Vaping Use  . Vaping Use: Never used  Substance and Sexual Activity  . Alcohol use: Yes    Alcohol/week: 3.0 standard drinks    Types: 3 Standard drinks or equivalent per week    Comment: social  . Drug use: No  . Sexual activity: Not on file  Other Topics Concern  . Not on file  Social History Narrative   Consumes 1 soda a day    Social Determinants of Health   Financial Resource Strain:   . Difficulty of Paying Living Expenses:   Food Insecurity:   . Worried About Running Out of Food in the Last Year:   . Ran Out of Food in the Last Year:   Transportation Needs:   . Lack of Transportation (Medical):   . Lack of Transportation (Non-Medical):   Physical Activity:   . Days of Exercise per Week:   . Minutes of Exercise per Session:   Stress:   . Feeling of Stress :   Social Connections:   . Frequency of Communication with Friends and Family:   . Frequency of Social Gatherings with Friends and Family:   . Attends Religious Services:   . Active Member of Clubs or Organizations:   . Attends Club or Organization Meetings:   . Marital Status:     His Allergies Are:  No Known Allergies:   His Current Medications Are:  Outpatient Encounter Medications as of 08/23/2019  Medication Sig  . carbidopa-levodopa (SINEMET IR) 25-100 MG tablet Take 1.5 tablets by mouth 4 (four) times daily. At 8, 12, 4 PM and 8 PM  . HYDROcodone-acetaminophen (NORCO) 5-325 MG tablet Take 1 tablet by mouth every 4 (four)  hours as needed for moderate pain.  . NEUPRO 4 MG/24HR PLACE 1 PATCH ONTO SKIN DAILY ( IN LIEU OF 6MG CHANGE IN DOSE) (Patient taking differently: Place 1 patch onto the skin daily. )  . omeprazole (PRILOSEC) 20 MG capsule 20 mg 2 (two) times daily before a meal.   . rasagiline (AZILECT) 1 MG TABS tablet Take 1 tablet (1 mg total) by mouth daily.   No facility-administered encounter medications on file as of 08/23/2019.  :    Review of Systems:  Out of a complete 14 point review of systems, all are reviewed and negative with the exception of these symptoms as listed below: Review of Systems  Neurological:       Here for 4 month f/u on PD. Pt reports he has been doing well- no falls since last visit. Reports he was able to play 18 holes of golf recently and has not been able to that in quite a while     Objective:  Neurological Exam  Physical Exam Physical Examination:   Vitals:   08/23/19 0946  BP: 122/76  Pulse: 84    General Examination: The patient is a very pleasant 64 y.o. male in no acute distress. He appears well-developed and well-nourished and well groomed.   HEENT:Normocephalic, unremarkable scar left parietal scalpfrom DBS.Pupils are equal, round and reactive to light, extraocular tracking shows mild saccadic breakdown without nystagmus noted. There is no limitation to his gaze. There is mild decrease in eye blink rate. Hearing is intact. Face is symmetric with moderatefacial masking,no lip, neck or jaw tremor. Oropharynx exam revealsmoderate airway crowding.There is no sialorrhea.   Chest:is clear to auscultation without wheezing, rhonchi or crackles noted.  Heart:sounds are regular and normal without murmurs, rubs or gallops noted.   Abdomen:is soft, non-tender and non-distended with normal bowel sounds appreciated on auscultation.  Extremities:There1-2+ pitting edema in the distal lower extremities bilaterally, right side a little worse than left.     Skin: is warm and dry with no trophic changes noted.Chronic swelling type changes and mild redness in both distal lower extremities, better.  Musculoskeletal: exam reveals nonew findings.(He is status post right second toe hammertoe repair).  Neurologically:  Mental status: The patient is awake and alert, paying good attention. He is able to completely provide the history. He is oriented to: person, place, time/date, situation, day of week, month of year and year. His memory, attention, language and knowledge are intact. There is no aphasia, agnosia, apraxia or anomia. There is perhaps mild bradyphrenia. Speech is mild to moderately hypophonic with no dysarthria noted. Mood is congruent and affect is normal.   Cranial nerves are as described above under HEENT exam. In addition, shoulder shrug is normal, but right shoulder is a little higher than left shoulder, stable findings.  Motor exam: Normal bulk, and strength for age is noted. There are no dyskinesias noted.  Tone is mild to moderately rigid with presence of cogwheeling in the right upper extremity and RLE. There is overall moderate bradykinesia. There is no drift or rebound.  There is an intermittent mild resting tremor in the LUE.   Fine motor skills exam:moderate impairment noted on the right, mild to moderate impairment on the left.   Cerebellar testing shows no dysmetria or intention tremor on finger to nose testing. Heel to shin is unremarkable bilaterally. There is no truncal or gait ataxia.   Sensory exam is intact to light touch in both upper and lower extremities.  Gait, station and balance: He stands up from the seated position with no significant difficulty but does need to push up with His hands. He needs no assistance. No veering to one side is noted.Posture fairly good today,no obviouslimp today.Walks with decreased arm swing bilaterally, right more so than left.  Assessment and Plan:   In summary,  [name] is a very pleasant79 year old malewith an underlying history of hyperlipidemia and kidney stones, s/p L DBS for PD, who presents for followup consultation of his right-sided  predominant Parkinson's disease, as well as obstructive sleep apnea on AutoPap therapy. He was diagnosed with Parkinson's disease in 2010, symptoms date back to 2009.He had no telltale history of RBD, hadsome vivid dreamsin thepast. He is status post left STN DBS placement in February2019, andhas done quite well after that. He continues to be on Neupro patch. We reduced the patch to 6 mg strength in May 2018 for lower extremity swelling.I suggested we reduce his Neupro patch further to 4 mg in early 2020. His home sleep test from 12/23/2017 showed severe obstructive sleep apnea with an AHI of 46.9/h, O2 nadir of 84%. He has established treatment on AutoPap. He is compliant with treatment and commended for it. Hehas a family history of obstructive sleep apnea inhismother and brother.He had interim programming appointments and a follow-up with Dr. Deboraha Sprang or Elbert Ewings, nurse practitioner, latest appointment in June 2021.  He is planning to pursue his second DBS next year.  I had suggested he increase his Sinemet to 1-1/2 pills 5 times a day in March 2021.  He reports that he tried it but went back to the 4 times a day scheduled.  He is doing well currently with the adjustment in his programming and settings are doing him better.  He is encouraged to continue with Neupro patch 4 mg, Azilect 1 mg once daily and Sinemet 25-100 mg strength 1-1/2 pills 4 times a day, he takes it approximately 4 hourly.  He has had flareup and restless leg symptoms in the past several months.  He is advised to try gabapentin and low-dose 100 mg strength, he can take 1 pill as needed at bedtime more consistently if it is helpful.  We talked about expectations and side effects including drowsiness, balance issues.  He had tried  melatonin at night for sleep, he is encouraged to try it again if need be but for now, he is advised to start the gabapentin only.  I provided a new prescription and refills to his other Parkinson's prescriptions.  He is advised to follow-up routinely in 6 months, sooner if needed.  I answered all his questions today and he was in agreement.  I spent 30 minutes in total face-to-face time and in reviewing records during pre-charting, more than 50% of which was spent in counseling and coordination of care, reviewing test results, reviewing medications and treatment regimen and/or in discussing or reviewing the diagnosis of PD, OSA, RLS, the prognosis and treatment options. Pertinent laboratory and imaging test results that were available during this visit with the patient were reviewed by me and considered in my medical decision making (see chart for details).

## 2019-08-23 NOTE — Patient Instructions (Signed)
It looks like Valentina Shaggy, nurse practitioner did suggest increasing the Sinemet to 1-1/2 pills 5 times a day last month but if the programming has helped you and you are stable on the Sinemet 1-1/2 pills 4 times a day I would favor you continue with your current medications for Parkinson's disease.  For your restless leg symptoms and intermittent back pain and also difficulty with sleep at night, I suggest we try you on gabapentin low-dose. Start Neurontin (gabapentin) 100 mg strength: Take 1 pill nightly at bedtime as needed.you can also take it every night if it consistently helps.  If you feel that it does not help enough, we can certainly consider increasing the dose, let me know in a week or 2 by email or phone call.   The most common side effects reported are sedation or sleepiness. Rare side effects include balance problems, confusion.

## 2020-02-23 ENCOUNTER — Ambulatory Visit: Payer: Self-pay | Admitting: Neurology

## 2020-03-13 ENCOUNTER — Encounter: Payer: Self-pay | Admitting: Neurology

## 2020-03-13 ENCOUNTER — Ambulatory Visit (INDEPENDENT_AMBULATORY_CARE_PROVIDER_SITE_OTHER): Payer: Managed Care, Other (non HMO) | Admitting: Neurology

## 2020-03-13 VITALS — BP 118/72 | HR 83 | Ht 75.0 in | Wt 278.0 lb

## 2020-03-13 DIAGNOSIS — G2581 Restless legs syndrome: Secondary | ICD-10-CM | POA: Diagnosis not present

## 2020-03-13 DIAGNOSIS — G20 Parkinson's disease: Secondary | ICD-10-CM

## 2020-03-13 DIAGNOSIS — G4733 Obstructive sleep apnea (adult) (pediatric): Secondary | ICD-10-CM | POA: Diagnosis not present

## 2020-03-13 DIAGNOSIS — Z9989 Dependence on other enabling machines and devices: Secondary | ICD-10-CM | POA: Diagnosis not present

## 2020-03-13 MED ORDER — GABAPENTIN 100 MG PO CAPS: ORAL_CAPSULE | ORAL | 3 refills | Status: DC

## 2020-03-13 NOTE — Progress Notes (Signed)
Subjective:    Patient ID: [name] is a 65 y.o. male.  HPI     Interim history:   [name] is a 65 year old right-handed gentleman with an underlying history of hyperlipidemia and kidney stones, s/p DBS for PD, who presents for followup consultation of his right-sided predominant Parkinson's disease, and OSA on AutoPap therapy.  The patient is unaccompanied today. I last saw him on 08/23/2019, at which time he reported that the programming change recently had helped, he had a recent appointment at [facility].  He was advised to continue with his Sinemet which was 1-1/2 pills 4 times a day although he had been advised to increase it to 5 times a day previously when he went to [facility].  He felt stable on the 4 times a day scheduling.  He was having more restless leg symptoms.  He was advised to start low-dose gabapentin 100 mg strength.   Today, 03/13/2020: He reports doing fairly well, no recent falls, stable motor wise. He reports, that he was turned down for his disability.  He is not quite ready to appeal it.  He may continue to work part-time or may decide to team up with one of his friends.  Sons are doing well. Wife, [name], has not noticed any concerning findings, per his report and felt it was okay for him to come by himself to this appointment.  He still has issues sleeping at night.  He may average about 4-1/2 To 5 hours of sleep on any given night.  He is consistent with his AutoPap.  I reviewed his last download, he is compliant with his AutoPap, I reviewed the last 30 days, average usage was 5 hours and 20 minutes, date range was 02/12/2020 through 03/12/2020.  Average AHI 0.8/h, average pressure for the 95th percentile 8.3 cm, average leak for the 95th percentile 14.2 L/min.  Percent use days greater than 4 hours at 80% which is very good.  He had a follow-up appointment with Dr. Deboraha Sprang on 03/07/2020 and I reviewed the note.  He had a recent increase in the stimulation level. Not  ready for second DBS.  He has had more restless leg symptoms.  He usually takes gabapentin 100 mg at night, sometimes he skips it if he is really sleepy.  If he has symptoms they are typically when he is in bed trying to fall asleep and sometimes the symptoms to prevent him from falling asleep and he has to get up and walk around.  He has not had any recent falls.  No mood related issues.  Overall motor wise doing really well.  He continues to take Neupro, Azilect and Sinemet, Sinemet is 1-1/2 pills 4 times a day.  Lately, he has not been on a regular exercise regimen.  He does have a stationary bike in a boxing bag at home.   The patient's allergies, current medications, family history, past medical history, past social history, past surgical history and problem list were reviewed and updated as appropriate.    Previously (copied from previous notes for reference):       I saw him on 04/20/2019, at which time I suggested he increase his Sinemet.       I saw him on 12/21/18, at which time he was compliant with his AutoPap.  He reported doing okay with it.  He had ongoing issues with lower extremity swelling.  He was advised to exercise on a more consistent basis.  He had interim  problems with hernia and reported that he may need surgery.  He ended up having right hydronephrosis and required a stent placement on 04/14/2019.    He had an interim appointment with [facility] for programming on 12/30/2018 and a subsequent appointment with Dr. Deboraha Sprang on 01/20/2019 and I reviewed the office note.    I reviewed his AutoPap compliance data from 03/20/2019 through 04/18/2019 which is a total of 30 days, during which time he used his machine 27 days with percent use days greater than 4 hours at 83%, indicating very good compliance with an average usage of 5 hours and 35 minutes for days on treatment, residual AHI at goal at 0.5/h, 95th percentile pressure at 8.3 cm with a range of 7 cm to 13 cm with EPR, leak  acceptable with a 95th percentile at 10.3 L/min.     I saw him in a virtual visit on 07/30/2018, at which time he was compliant with his AutoPap.  I suggested we continue with his medication regimen including Azilect 1 mg once daily, Sinemet 1-1/2 pills 4 times a day and Neupro patch 4 mg daily.     I reviewed his AutoPap compliance data from 11/17/2018 through 12/16/2018 which is a total of 30 days, during which time he used his machine 27 days with percent use days greater than 4 hours at 80%, indicating very good compliance with an average usage of 5 hours and 58 minutes, residual AHI at goal at 0.7/h, leak high with a 95th percentile at 29.5 L/min on a pressure range of 7 cm to 13 cm with EPR, 95th percentile of pressure at 8.4 cm.       I saw him on 11/06/2017, at which time he felt stable from the Parkinson's standpoint.  He had recently seen Dr. Linus Mako at [facility] in August and programming was kept the same.  His lower extremity swelling had gotten worse.     He emailed in December and in early January 2020 we reduced his Neupro patch from 6 mg to 4 mg daily.   He saw Cecille Rubin, nurse practitioner in the interim on 03/31/2018 for his first AutoPap visit after his home sleep test and he was adequate with his AutoPap compliance at the time.    I reviewed his AutoPap compliance data from 06/29/2018 through 07/28/2018 which is a total of 30 days, during which time he used his machine 26 days with percent used days greater than 4 hours at 77%, indicating good compliance with an average usage of 4 hours and 43 minutes only, residual AHI at goal at 1/h, 95th percentile of pressure at 8.3 cm, leak on the higher side with a 95th percentile at 18.3 L/min on a pressure range of 7 cm to 13 cm with EPR.    I saw him on 05/06/2017, at which time he was doing well, walking and tremor were since his first programming appointment on 04/18/2017. Of note, he had his left DBS surgery on 04/04/2017 under Dr.  Salomon Fick. He has had a very good experience overall from beginning to end.      I saw him on 11/05/2016, at which time he reported no significant repercussions after reducing the Neupro and increasing the Sinemet. His swelling had improved a little bit. He was still in the process of being evaluated for DBS surgery. I suggested we keep his medication regimen the same. We talked about the importance of losing weight especially in preparation for elective surgery.  I saw him on 07/04/2016, at which time he reported doing okay, was planning to pursue DBS surgery perhaps later this year. He was quite apprehensive. He had not scheduled his psychological evaluation or surgical consultation. He was overall considered a good candidate for DBS treatment per neurology. He was more on time with his Sinemet and noticed improvement in his symptoms, lower extremity swelling was slightly worse. Neupro was working well at 8 mg, but d/t swelling, I suggested with reduce it. I also suggested we increase his Sinemet to 1-1/2 pills for times a day in lieu of keeping the Neupro at 8 mg daily. He was no longer taking gabapentin and his foot pain was indeed a little bit better with time, gabapentin made him too sleepy.    I saw him on 03/05/2016, at which time he reported taking C/L 4 times a day, but not always all 4 doses, as he would forget at times. Low-dose gabapentin prn was helping his foot pain. No recent falls, no recent mood or memory issues, no word on the referral from [facility]. Had a recent cold and congestion. He was advised to continue with the current management, including Sinemet 1 pill 4 times a day, Azilect 1 mg once daily, Neupro patch 8 mg once daily, gabapentin 300 mg at night as needed.   I resubmitted referral to [facility] for DBS Eval and patient was seen in the interim by Dr. Linus Mako on 05/24/16. I reviewed the note.     I saw him on 12/04/2015, at which time he reported ongoing issues with his right foot  including status post surgery in April 2017 for hammertoes but he had ongoing issues with pain and more stiffness. He felt overall that his right side was more stiff and tremulous. Since starting Sinemet he had noted no telltale difference in his symptoms. He had consulted with different podiatrists and had tried orthotics. Mood and memory were stable. He was not sleeping very well. We talked about DBS treatment in more detail. I made a referral to Dr. Linus Mako at [facility]. I also increased his Sinemet to 1 pill 4 times a day. I suggested we try him on low-dose gabapentin for his right foot pain.   I saw him on 08/01/2015, at which time he reported feeling worse. He had more difficulty exercising, he had gained weight. He had foot surgery and his balance had been worse since then. He had hammertoe surgery on 05/25/2015. Because of decrease in mobility after his surgery his weight increase. His tremor was worse. Memory was stable. I suggested we continue with Neupro 8 mg patch daily. I suggested he continue with Azilect once daily. I suggested we start him on the rytary 95 mg strength with gradual titration. However, he called in July reporting that the medication was too expensive, I therefore, switched him to Sinemet generic. He also requested a handicap placard form to be filled out which I provided in September.   Of note, he missed an appointment on 06/20/2015 due to being sick. I saw him on 12/01/2014, at which time he reported a change in his insurance, mood and memory were stable, lower extremity swelling was stable, we kept his Neupro patch at 8 mg an Azilect 1 mg once daily the same. He was working full-time, also active with his 2 teenage boys, 63 year old an 35 year old. His wife had a recent change in her job.    I saw him on 08/02/2014, at which time he reported doing well. He  had some leg swelling. His right hand swelling was a little worse. Cognitively and mood wise he was stable. Balance may  have been off at times but generally speaking he was doing well.    I saw him on 04/05/2014, at which time he reported feeling fairly stable but his wife felt his balance was not as good. He was working out with a Clinical research associate. He had picked up boxing. He was working full-time. He cut back on his sodas. I kept him on his medications, Neupro patch 6 mg and Azilect 1 mg. He had some ongoing issues with lower extremity swelling and I referred him to vascular surgery for consultation. He was seen by a vascular specialist, Dr. Scot Dock on 04/13/2014. He had Doppler studies to his lower extremities and was advised he had no DVT and good arterial flow. His swelling improved.    I saw him on 12/03/2013, at which time he reported doing well and had noted a benefit from Neupro patch 6 mg. I continued him on this dose as well as Azilect 1 mg once daily. His exam was stable.   I saw him on 07/28/2013, at which time he reported some worsening of his tremor and his gait. He was able to tolerate neupro patch 4 mg strength. He has had some skin irritation, most likely from the adhesive. He was not exercising regularly but endorsed being active and working full-time. His memory was stable. He denies any impulse control disorder but had mild daytime somnolence which was not as severe as when he was on Requip long-acting.     I saw him on 02/05/2013, at which time I felt his physical exam was a little worse since stopping Requip XL. He had stopped this because of daytime somnolence and severe nausea. I suggested we start him on Neupro patch. I provided him with samples. [place]     I first met him on 07/15/2012, at which time I suggested he start taking coenzyme Q 10. I also suggested that he switch his Requip XL 4 mg to nighttime because of report of daytime somnolence. I did not increase make any other changes to his medications and felt that he was overall stable.     He previously  followed with Dr. Jeneen Rinks love and was last seen by him on 03/17/2012 at which time Dr. Erling Cruz increase his Requip XL and continued him on Azilect. He was diagnosed in 12/2008, and Sx go back to a year prior to that.   MRI brain without contrast was done in the past. He has been on rasagiline, which improved his tremor. 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 There is no family history of tremor. There is no history of REM behavior disorder. He has had no falls, or hallucinations or involuntary movements otherwise. He exercises not very regularly. His memory and mood have been stable. He has no problems with lower extremities swelling or compulsive thoughts or gambling. He works full-time as a Engineer, site.    His Past Medical History Is Significant For: Past Medical History:  Diagnosis Date  . Calculus of kidney   . Lesion of ulnar nerve   . Paralysis agitans ([place])   . Parkinson's disease ([place])   . Sleep apnea    cpap - setting at 7   . Sleep disturbance, unspecified     His Past Surgical History Is Significant For:  Past Surgical History:  Procedure Laterality Date  . CYSTOSCOPY WITH RETROGRADE PYELOGRAM, URETEROSCOPY AND STENT PLACEMENT Right 04/14/2019   Procedure: CYSTOSCOPY WITH RETROGRADE PYELOGRAM, URETEROSCOPY AND STENT PLACEMENT;  Surgeon: Lucas Mallow, MD;  Location: [facility];  Service: Urology;  Laterality: Right;  . DBS surgery     . HAMMERTOE RECONSTRUCTION WITH WEIL OSTEOTOMY Right 05/25/2015   Procedure: RIGHT SECOND TOE METATARSAL WEIL OSTEOTOMY  HAMMERTOE CORRECTION COLLATERAL LIGAMENT REPAIR; FLEXOR TO EXTENSOR TRANSFER ;  Surgeon: Wylene Simmer, MD;  Location: [place];  Service: Orthopedics;  Laterality: Right;  . kidney stones  1991    His Family History Is Significant For: Family History  Problem Relation Age of Onset  . Heart failure Father   . Cancer Father   . Diabetes Father   . Sleep apnea  Mother   . Sleep apnea Brother     His Social History Is Significant For: Social History   Socioeconomic History  . Marital status: Married    Spouse name: [name]   . Number of children: 2  . Years of education: Not on file  . Highest education level: Bachelor's degree (e.g., BA, AB, BS)  Occupational History  . Not on file  Tobacco Use  . Smoking status: Never Smoker  . Smokeless tobacco: Never Used  Vaping Use  . Vaping Use: Never used  Substance and Sexual Activity  . Alcohol use: Yes    Alcohol/week: 3.0 standard drinks    Types: 3 Standard drinks or equivalent per week    Comment: social  . Drug use: No  . Sexual activity: Not on file  Other Topics Concern  . Not on file  Social History Narrative   Consumes 1 soda a day    Social Determinants of Health   Financial Resource Strain: Not on file  Food Insecurity: Not on file  Transportation Needs: Not on file  Physical Activity: Not on file  Stress: Not on file  Social Connections: Not on file    His Allergies Are:  No Known Allergies:   His Current Medications Are:  Outpatient Encounter Medications as of 03/13/2020  Medication Sig  . carbidopa-levodopa (SINEMET IR) 25-100 MG tablet Take 1.5 tablets by mouth 4 (four) times daily. At 8, 12, 4 PM and 8 PM  . gabapentin (NEURONTIN) 100 MG capsule Take 1 capsule (100 mg total) by mouth at bedtime as needed.  . rasagiline (AZILECT) 1 MG TABS tablet Take 1 tablet (1 mg total) by mouth daily.  . rotigotine (NEUPRO) 4 MG/24HR Place 1 patch onto the skin daily.  . [DISCONTINUED] HYDROcodone-acetaminophen (NORCO) 5-325 MG tablet Take 1 tablet by mouth every 4 (four) hours as needed for moderate pain.  . [DISCONTINUED] omeprazole (PRILOSEC) 20 MG capsule 20 mg 2 (two) times daily before a meal.    No facility-administered encounter medications on file as of 03/13/2020.  :  Review of Systems:  Out of a complete 14 point review of systems, all are reviewed and negative  with the exception of these symptoms as listed below: Review of Systems  Neurological:       Here for f/u on PD and OSA. Reports overall he is doing well. He would like to discuss increasing his Gabapentin at night for his RLS sx if possible.     Objective:  Neurological Exam  Physical Exam Physical Examination:   Vitals:   03/13/20 0924  BP: 118/72  [name]: 83  SpO2: 94%    General  Examination: The patient is a very pleasant 65 y.o. male in no acute distress. He appears well-developed and well-nourished and well groomed.   HEENT:Normocephalic, unremarkable scar left parietal scalpfrom DBS.Pupils are equal, round and reactive to light, extraocular tracking shows mild saccadic breakdown without nystagmus noted. There is no limitation to his gaze. There is mild decrease in eye blink rate. Hearing is intact. Face is symmetric with moderatefacial masking,no lip, neck or jaw tremor. Oropharynx exam revealsmoderate airway crowding.There is no sialorrhea.   Chest:is clear to auscultation without wheezing, rhonchi or crackles noted.  Heart:sounds are regular and normal without murmurs, rubs or gallops noted.   Abdomen:is soft, non-tender and non-distended with normal bowel sounds appreciated on auscultation.  Extremities:Theretrace to 1+ pitting edema in the distal lower extremities bilaterally, improved from before. .  Skin: is warm and dry with no trophic changes noted.Chronic swelling type changes and mild redness in both distal lower extremities, better.  Musculoskeletal: exam reveals nonew findings.(He isstatus post right second toe hammertoe repair).  Neurologically:  Mental status: The patient is awake and alert, paying good attention. He is able to completely provide the history. He is oriented to: person, place, time/date, situation, day of week, month of year and year. His memory, attention, language and knowledge are intact. There is no aphasia,  agnosia, apraxia or anomia. There isperhaps mildbradyphrenia. Speech is mildto moderately hypophonic with no dysarthria noted. Mood is congruent and affect is normal.   Cranial nerves are as described above under HEENT exam. In addition, shoulder shrug is normal, but right shoulder is a little higher than left shoulder, stable findings.  Motor exam: Normal bulk, and strength for age is noted. There are no dyskinesias noted.  Tone is mild to moderately rigid with presence of cogwheeling in the right upper extremity and RLE. There is overall moderate bradykinesia. There is no drift or rebound.  There is an intermittent mildresting tremor in the LUE.   Fine motor skills exam:moderate impairment noted on the right, mild to moderate impairment on the left, stable.  Cerebellar testing shows no dysmetria or intention tremor. There is no truncal or gait ataxia.   Sensory exam is intact to light touch in both upper and lower extremities.  Gait, station and balance: He stands up from the seated position with no significant difficulty but does need to push up with his hands. He needs no assistance. No veering to one side is noted.Posture fairly good today,no obviouslimp today.Walks with decreased arm swing bilaterally, right more so than left. Balance preserved.   Assessment and Plan:   In summary, [name] is a very pleasant75 year old malewith an underlying history of hyperlipidemia and kidney stones, s/pLDBS for PD, who presents for followup consultation of his right-sided predominant Parkinson's disease, obstructive sleep apnea on AutoPap therapy, as well as RLS. He was diagnosed with Parkinson's disease in 2010, symptoms date back to 2009.He had no telltale history of RBD, hadsome vivid dreamsin thepast. He is status post left STN DBS placement in February2019, andhas done quite well after that. He continues to be on Neupro patch. We reduced the patch to 6 mg strength  in May 2018 for lower extremity swelling.I suggested we reduce his Neupro patch furtherto 4 mg inearly 2020.His home sleep test from 12/23/2017 showed severe obstructive sleep apnea with an AHI of 46.9/h, O2 nadir of 84%. He has established treatment on AutoPap. He is compliant with treatment and commended for it. Hehas a family history of obstructive sleep apnea  inhismother and brother.He had interim programming appointments and a follow-up with Dr. Linus Mako or Elbert Ewings, nurse practitioner, latest appointment in Jan 2022. He is not quite ready to pursue his second DBS.  I had suggested he increase his Sinemet to 1-1/2 pills 5 times a day in March 2021.  He reports that he tried it but went back to the 4 times a day schedule.  He is doing well currently.  He is encouraged to continue with Neupro patch 4 mg, Azilect 1 mg once daily and Sinemet 25-100 mg strength 1-1/2 pills 4 times a day, he takes it approximately 4 hourly. He has benefited from gabapentin which we started in July 2021, it is low dose, 100 mg at bedtime as needed.  He is advised to increase this up to 3 pills at night as needed.  He is agreeable to increasing the gabapentin.  If he consistently takes 300 mg at bedtime we can consider changing the prescription to 300 mg strength.  For now, he will use the 100 mg pills and increase gradually as needed.  He is advised to follow-up routinely in 8 months, sooner if needed.  He has an appointment at [facility] in July.  I answered all his questions today and he was in agreement with the plan.

## 2020-03-13 NOTE — Patient Instructions (Addendum)
It was good to see you again today.  We will increase your gabapentin for restless leg syndrome to 3 pills at night.  You can take 1 pill each night and increase to up to 3 at night if needed.  I have adjusted your prescription for this.  Your other prescriptions for Sinemet, Neupro patch and Azilect generic should be on file with your pharmacy and up to date.  We can bring you electronically by July.  Please follow-up routinely in about 8 months, you have an appointment with Ardeth Perfect at [facility] in July and we can stagger our appointments.  Call or email with any interim questions or concerns.  Try to get back on track with some form of exercise on a daily basis such as using your stationary bike or walking. You have done a great job using your AutoPap machine.  Keep up the good work.

## 2020-03-29 IMAGING — US US ABDOMEN LIMITED
1 series · 14 of 25 positions shown · non-contrast
Comparison: CT abdomen and pelvis 03/19/2019

CLINICAL DATA: RIGHT upper quadrant pain, history Parkinson's
disease, kidney stones

EXAM:
ULTRASOUND ABDOMEN LIMITED RIGHT UPPER QUADRANT

[Series 1: us abdomen limited · 14 of 46 slices shown]
[im 1/46]
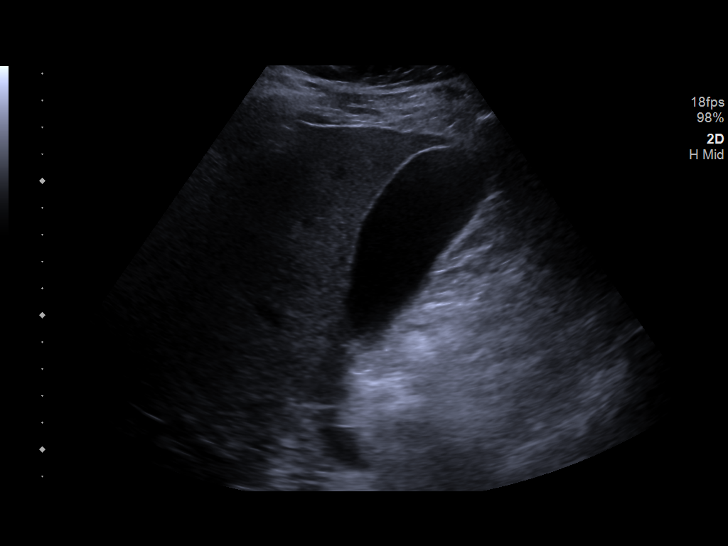
[im 4/46]
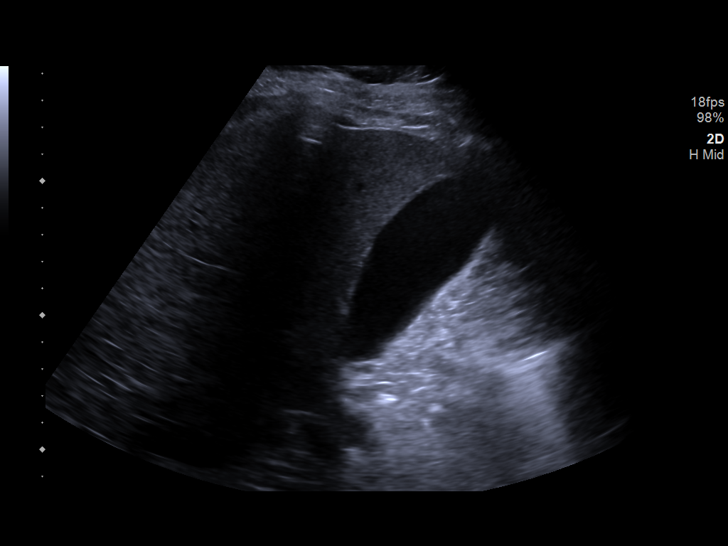
[im 8/46]
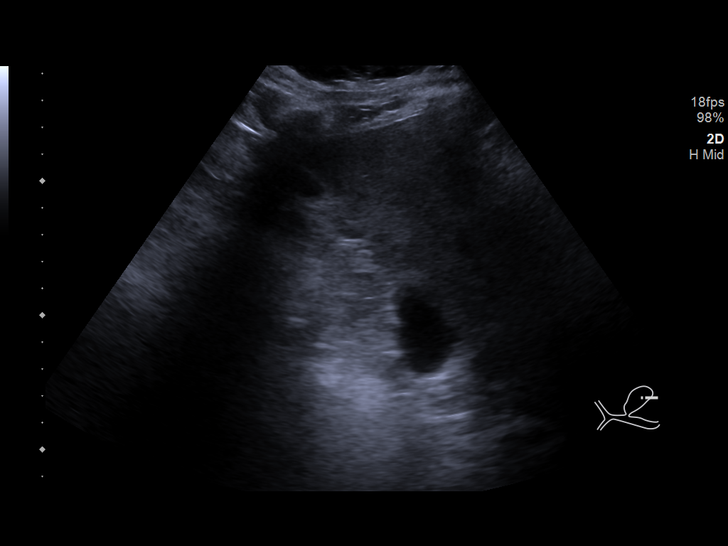
[im 12/46]
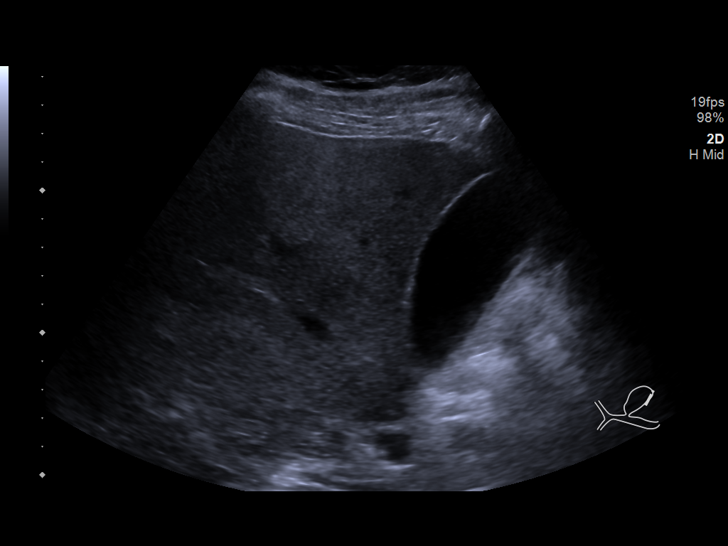
[im 16/46]
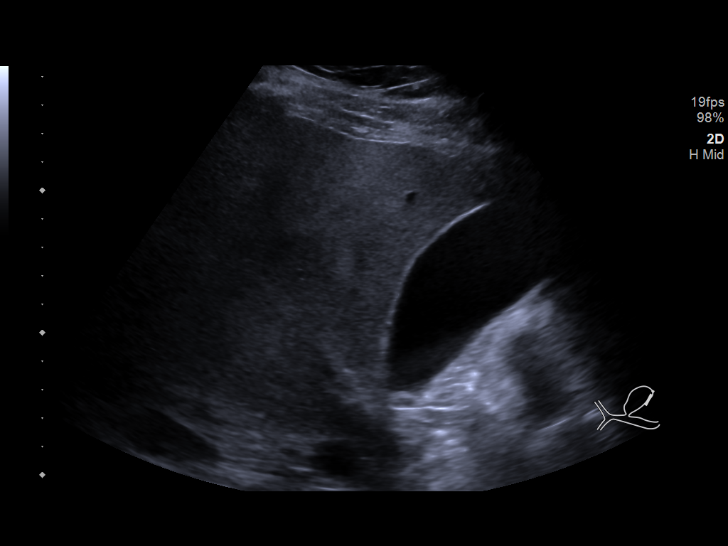
[im 17/46]
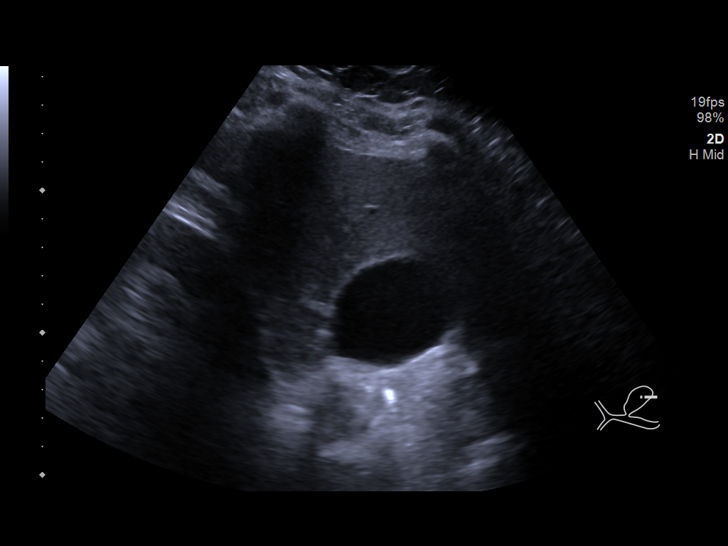
[im 21/46]
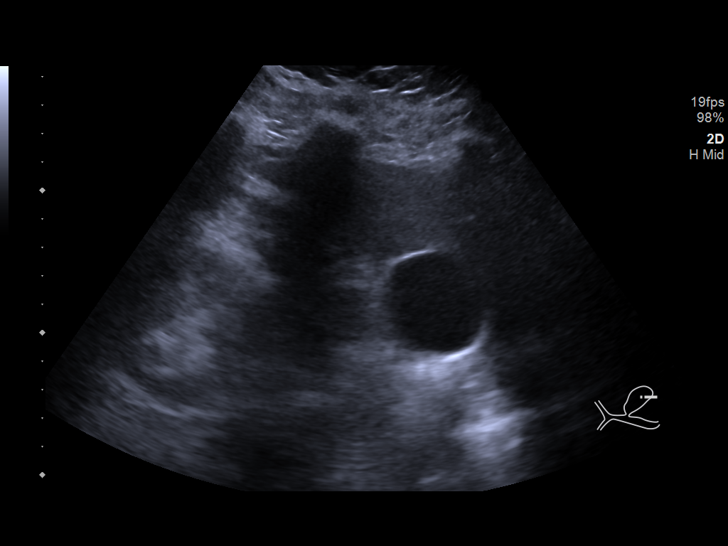
[im 25/46]
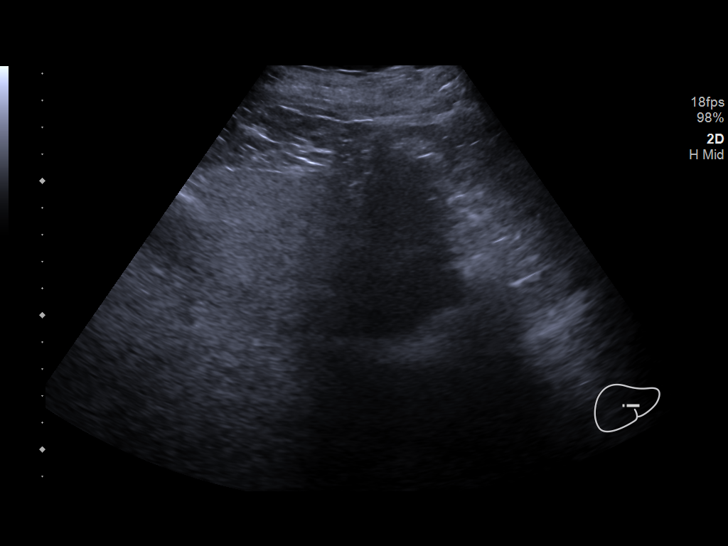
[im 29/46]
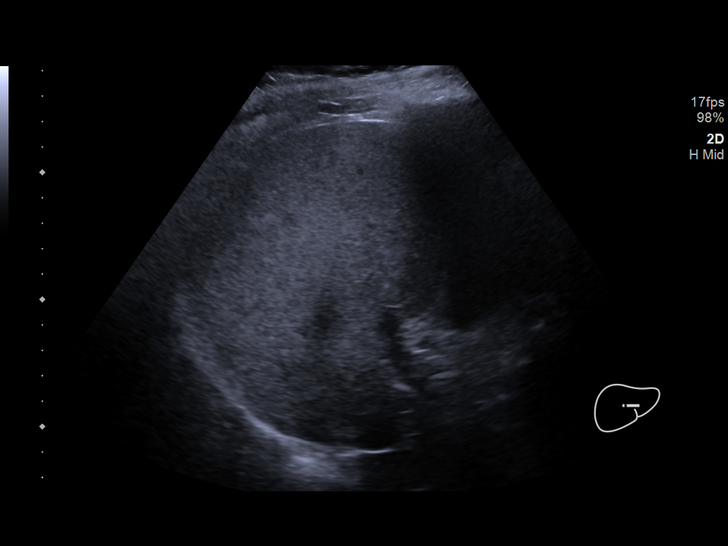
[im 31/46]
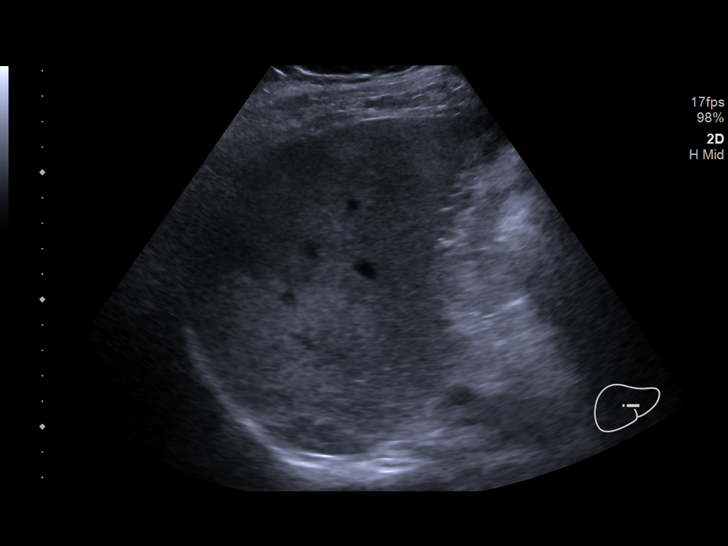
[im 34/46]
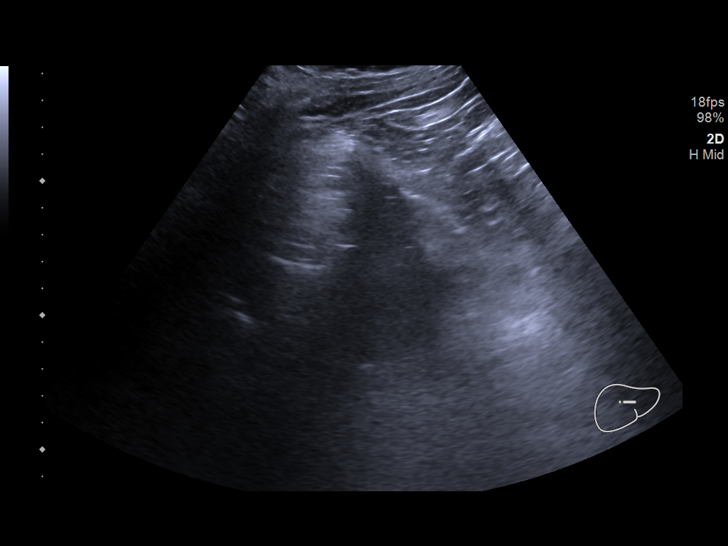
[im 38/46]
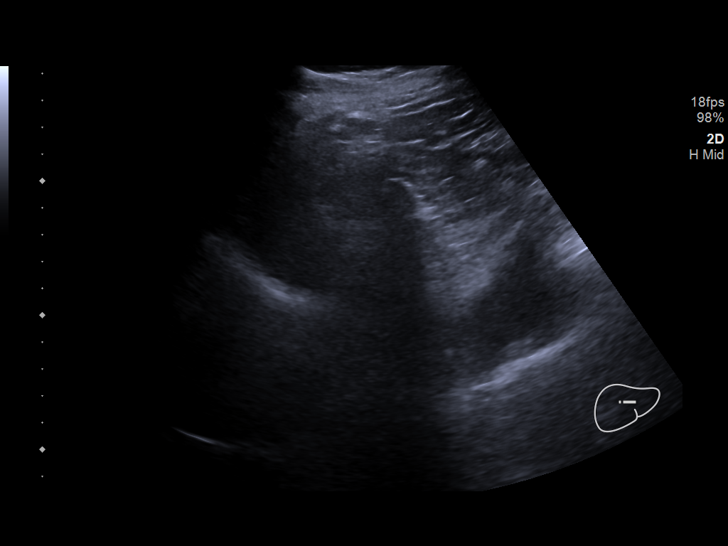
[im 42/46]
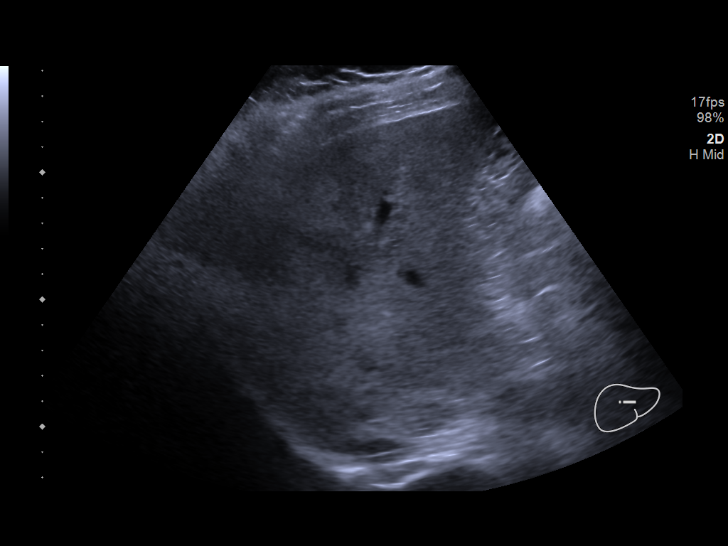
[im 46/46]
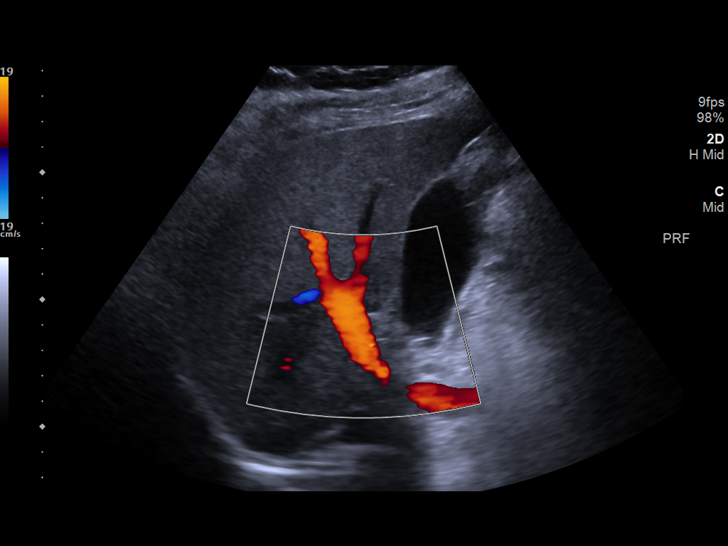

[14 of 25 positions shown; findings below may reference images not displayed]

FINDINGS: Gallbladder:

Normally distended without stones or wall thickening. No
pericholecystic fluid or sonographic Murphy sign.

Common bile duct:

Diameter: 3 mm, normal

Liver:

Minimally increased and heterogeneous parenchymal echogenicity
likely reflecting subtle fatty infiltration. No focal hepatic mass
or nodularity. Portal vein is patent on color Doppler imaging with
normal direction of blood flow towards the liver.

Other: No RIGHT upper quadrant free fluid.
IMPRESSION: Probable mild fatty infiltration of liver.

Otherwise negative RIGHT upper quadrant ultrasound.

## 2020-05-12 ENCOUNTER — Encounter: Payer: Self-pay | Admitting: Neurology

## 2020-05-15 ENCOUNTER — Telehealth: Payer: Self-pay | Admitting: Neurology

## 2020-05-15 NOTE — Telephone Encounter (Signed)
See my chart message from 05/15/20.

## 2020-05-15 NOTE — Telephone Encounter (Signed)
Pt is asking for a call to discuss the need of another discount card for for a Neupro patch, please call

## 2020-06-05 DIAGNOSIS — Z0271 Encounter for disability determination: Secondary | ICD-10-CM

## 2020-10-03 ENCOUNTER — Other Ambulatory Visit: Payer: Self-pay | Admitting: General Surgery

## 2020-10-03 DIAGNOSIS — R1013 Epigastric pain: Secondary | ICD-10-CM

## 2020-10-09 ENCOUNTER — Other Ambulatory Visit: Payer: Self-pay

## 2020-10-09 ENCOUNTER — Ambulatory Visit
Admission: RE | Admit: 2020-10-09 | Discharge: 2020-10-09 | Disposition: A | Discharge status: Home / Self Care | Payer: Managed Care, Other (non HMO) | Source: Ambulatory Visit | Attending: General Surgery | Admitting: General Surgery

## 2020-10-09 DIAGNOSIS — R1013 Epigastric pain: Secondary | ICD-10-CM

## 2020-10-11 ENCOUNTER — Other Ambulatory Visit: Payer: Self-pay | Admitting: General Surgery

## 2020-10-11 DIAGNOSIS — R1013 Epigastric pain: Secondary | ICD-10-CM

## 2020-10-12 ENCOUNTER — Encounter: Payer: Self-pay | Admitting: Neurology

## 2020-10-12 DIAGNOSIS — G20 Parkinson's disease: Secondary | ICD-10-CM

## 2020-10-12 MED ORDER — RASAGILINE MESYLATE 1 MG PO TABS: 1.0000 mg | ORAL_TABLET | Freq: Every day | ORAL | 3 refills | Status: DC

## 2020-10-25 ENCOUNTER — Other Ambulatory Visit: Payer: Self-pay

## 2020-10-25 ENCOUNTER — Ambulatory Visit (HOSPITAL_COMMUNITY)
Admission: RE | Admit: 2020-10-25 | Discharge: 2020-10-25 | Disposition: A | Discharge status: Home / Self Care | Payer: Managed Care, Other (non HMO) | Source: Ambulatory Visit | Attending: General Surgery | Admitting: General Surgery

## 2020-10-25 DIAGNOSIS — R1013 Epigastric pain: Secondary | ICD-10-CM

## 2020-10-25 MED ORDER — TECHNETIUM TC 99M MEBROFENIN IV KIT
5.5000 | PACK | Freq: Once | INTRAVENOUS | Status: AC | PRN
  Administered 2020-10-25: 5.5 via INTRAVENOUS

## 2020-11-08 ENCOUNTER — Ambulatory Visit (INDEPENDENT_AMBULATORY_CARE_PROVIDER_SITE_OTHER): Payer: Managed Care, Other (non HMO) | Admitting: Neurology

## 2020-11-08 ENCOUNTER — Encounter: Payer: Self-pay | Admitting: Neurology

## 2020-11-08 ENCOUNTER — Other Ambulatory Visit: Payer: Self-pay

## 2020-11-08 VITALS — BP 114/75 | HR 82 | Ht 75.0 in | Wt 269.2 lb

## 2020-11-08 DIAGNOSIS — G2581 Restless legs syndrome: Secondary | ICD-10-CM

## 2020-11-08 DIAGNOSIS — G4733 Obstructive sleep apnea (adult) (pediatric): Secondary | ICD-10-CM | POA: Diagnosis not present

## 2020-11-08 DIAGNOSIS — G20 Parkinson's disease: Secondary | ICD-10-CM | POA: Diagnosis not present

## 2020-11-08 DIAGNOSIS — Z9689 Presence of other specified functional implants: Secondary | ICD-10-CM

## 2020-11-08 DIAGNOSIS — R1310 Dysphagia, unspecified: Secondary | ICD-10-CM | POA: Diagnosis not present

## 2020-11-08 DIAGNOSIS — Z9989 Dependence on other enabling machines and devices: Secondary | ICD-10-CM

## 2020-11-08 MED ORDER — NEUPRO 4 MG/24HR TD PT24: 1.0000 | MEDICATED_PATCH | Freq: Every day | TRANSDERMAL | 3 refills | Status: DC

## 2020-11-08 NOTE — Patient Instructions (Signed)
It was nice to see you again today.  I am glad you have been able to retire and don't miss work!  You have indeed lost nearly 10 pounds since our last visit here.   Please continue pursuing a healthy lifestyle, physical activity on a regular basis, good hydration with water and healthy eating habits.  I think you are doing a good job, you have been quite consistent with your AutoPap, try to keep it on longer each night if he can.    With more significant weight loss, we can consider reevaluating your sleep apnea with a home sleep test in the near future.  I have renewed your prescription for your Neupro patch, all other prescriptions should be up-to-date.  Please keep your appointment with Valentina Shaggy in January 2023 and we will plan a follow-up in this clinic in about 8 months from now.    Keep [place] posted with phone calls or MyChart messaging in the interim as needed.    You can utilize the gabapentin as needed at this point.  Enjoy your trip to the beach in November.

## 2020-11-08 NOTE — Progress Notes (Signed)
Subjective:    Patient ID: [name] is a 65 y.o. male.  HPI    Interim history:   [name] is a 65 year old right-handed gentleman with an underlying history of hyperlipidemia and kidney stones, s/p DBS for PD, who presents for followup consultation of his right-sided predominant Parkinson's disease, and OSA on AutoPap therapy.  The patient is unaccompanied today. I last saw him on 03/13/2020, at which time he reported feeling fairly stable.  He was quite consistent with his AutoPap.  He was not quite ready for his second DBS, he had a follow-up with Dr. Linus Mako in January 2022.  He had noticed more restless legs type symptoms.  He was advised to increase his gabapentin to up to 300 mg at bedtime.  We decided to keep his Sinemet at 1-1/2 pills 4 times a day, Neupro at 4 mg once daily and Azilect 1 mg once daily.  Today, 11/08/2020: He reports feeling overall quite stable, helped to increase his Sinemet to 1-1/2 pills 5 times a day.  This was done in July when he last saw Elbert Ewings at [facility].  He is now taking it at 7 AM, approximately 10:30 AM, 2 PM, 5:30 PM and around 9 PM.  He has not needed the gabapentin as much, in fact, has only taken it a couple of times.  He has been stable on his Neupro which he needs a refill for and Azilect once daily.  He has not fallen recently.  He is working on weight loss.  Elbert Ewings ordered an MBBS, he had a swallow study on 10/11/2020 and I was able to review the results.  He had small penetration with thin liquids, trace to mild residuals with pudding, no frank aspiration.  He has been more mindful about his swallowing, eats in smaller bites and also drinks sips of water in between, overall he is trying to hydrate better with water.  He is exercising regularly using a stationary bike and walking.  He likes to golf.  He also had a recent upper GI and HIDA scan.  He was having quite a bit of GI issues.  He retired in June 2022.  He also helped move  his ultrasound to [place] recently.  Wife works full-time and has to travel quite a bit, currently in [place] and will be traveling to [place] in November.  They are planning a beach trip around Thanksgiving.  He uses his AutoPap, reports difficulty using it when he is out of town.  He was quite consistent in the month of July with very good compliance, lately in the past 30 days he has had a decline in the hours of usage, averaging 4 hours and 39 minutes, percent use days greater than 4 hours at 57%, was closer to 69% when we look at the last 90 days.  Average apnea control is very good, AHI around 0.7/h, pressure right around 8 cm, leak in the acceptable range, lately with a slightly higher leak, 95th percentile at 13.7 L/min.  Lately he has had more right hip pain.  He will eventually need right foot surgery, he is planning to getting an opinion from Dr. Tonita Cong who came recommended.  He also pulled something in his right shoulder when he played croquet with his family recently, saw an orthopedic specialist for his right shoulder recently.   The patient's allergies, current medications, family history, past medical history, past social history, past surgical history and problem list were reviewed and updated  as appropriate.    Previously (copied from previous notes for reference):    I saw him on 08/23/2019, at which time he reported that the programming change recently had helped, he had a recent appointment at [facility].  He was advised to continue with his Sinemet which was 1-1/2 pills 4 times a day although he had been advised to increase it to 5 times a day previously when he went to [facility].  He felt stable on the 4 times a day scheduling.  He was having more restless leg symptoms.  He was advised to start low-dose gabapentin 100 mg strength.    I reviewed the last 30 days, average usage was 5 hours and 20 minutes, date range was 02/12/2020 through 03/12/2020.  Average AHI 0.8/h, average  pressure for the 95th percentile 8.3 cm, average leak for the 95th percentile 14.2 L/min.  Percent use days greater than 4 hours at 80% which is very good.         I saw him on 04/20/2019, at which time I suggested he increase his Sinemet.       I saw him on 12/21/18, at which time he was compliant with his AutoPap.  He reported doing okay with it.  He had ongoing issues with lower extremity swelling.  He was advised to exercise on a more consistent basis.  He had interim problems with hernia and reported that he may need surgery.  He ended up having right hydronephrosis and required a stent placement on 04/14/2019.    He had an interim appointment with [facility] for programming on 12/30/2018 and a subsequent appointment with Dr. Deboraha Sprang on 01/20/2019 and I reviewed the office note.    I reviewed his AutoPap compliance data from 03/20/2019 through 04/18/2019 which is a total of 30 days, during which time he used his machine 27 days with percent use days greater than 4 hours at 83%, indicating very good compliance with an average usage of 5 hours and 35 minutes for days on treatment, residual AHI at goal at 0.5/h, 95th percentile pressure at 8.3 cm with a range of 7 cm to 13 cm with EPR, leak acceptable with a 95th percentile at 10.3 L/min.     I saw him in a virtual visit on 07/30/2018, at which time he was compliant with his AutoPap.  I suggested we continue with his medication regimen including Azilect 1 mg once daily, Sinemet 1-1/2 pills 4 times a day and Neupro patch 4 mg daily.     I reviewed his AutoPap compliance data from 11/17/2018 through 12/16/2018 which is a total of 30 days, during which time he used his machine 27 days with percent use days greater than 4 hours at 80%, indicating very good compliance with an average usage of 5 hours and 58 minutes, residual AHI at goal at 0.7/h, leak high with a 95th percentile at 29.5 L/min on a pressure range of 7 cm to 13 cm with EPR, 95th percentile of  pressure at 8.4 cm.       I saw him on 11/06/2017, at which time he felt stable from the Parkinson's standpoint.  He had recently seen Dr. Linus Mako at [facility] in August and programming was kept the same.  His lower extremity swelling had gotten worse.     He emailed in December and in early January 2020 we reduced his Neupro patch from 6 mg to 4 mg daily.   He saw Cecille Rubin, nurse practitioner  in the interim on 03/31/2018 for his first AutoPap visit after his home sleep test and he was adequate with his AutoPap compliance at the time.    I reviewed his AutoPap compliance data from 06/29/2018 through 07/28/2018 which is a total of 30 days, during which time he used his machine 26 days with percent used days greater than 4 hours at 77%, indicating good compliance with an average usage of 4 hours and 43 minutes only, residual AHI at goal at 1/h, 95th percentile of pressure at 8.3 cm, leak on the higher side with a 95th percentile at 18.3 L/min on a pressure range of 7 cm to 13 cm with EPR.    I saw him on 05/06/2017, at which time he was doing well, walking and tremor were since his first programming appointment on 04/18/2017. Of note, he had his left DBS surgery on 04/04/2017 under Dr. Salomon Fick. He has had a very good experience overall from beginning to end.      I saw him on 11/05/2016, at which time he reported no significant repercussions after reducing the Neupro and increasing the Sinemet. His swelling had improved a little bit. He was still in the process of being evaluated for DBS surgery. I suggested we keep his medication regimen the same. We talked about the importance of losing weight especially in preparation for elective surgery.     I saw him on 07/04/2016, at which time he reported doing okay, was planning to pursue DBS surgery perhaps later this year. He was quite apprehensive. He had not scheduled his psychological evaluation or surgical consultation. He was overall considered a  good candidate for DBS treatment per neurology. He was more on time with his Sinemet and noticed improvement in his symptoms, lower extremity swelling was slightly worse. Neupro was working well at 8 mg, but d/t swelling, I suggested with reduce it. I also suggested we increase his Sinemet to 1-1/2 pills for times a day in lieu of keeping the Neupro at 8 mg daily. He was no longer taking gabapentin and his foot pain was indeed a little bit better with time, gabapentin made him too sleepy.    I saw him on 03/05/2016, at which time he reported taking C/L 4 times a day, but not always all 4 doses, as he would forget at times. Low-dose gabapentin prn was helping his foot pain. No recent falls, no recent mood or memory issues, no word on the referral from [facility]. Had a recent cold and congestion. He was advised to continue with the current management, including Sinemet 1 pill 4 times a day, Azilect 1 mg once daily, Neupro patch 8 mg once daily, gabapentin 300 mg at night as needed.   I resubmitted referral to [facility] for DBS Eval and patient was seen in the interim by Dr. Linus Mako on 05/24/16. I reviewed the note.     I saw him on 12/04/2015, at which time he reported ongoing issues with his right foot including status post surgery in April 2017 for hammertoes but he had ongoing issues with pain and more stiffness. He felt overall that his right side was more stiff and tremulous. Since starting Sinemet he had noted no telltale difference in his symptoms. He had consulted with different podiatrists and had tried orthotics. Mood and memory were stable. He was not sleeping very well. We talked about DBS treatment in more detail. I made a referral to Dr. Linus Mako at [facility]. I also increased his Sinemet to  1 pill 4 times a day. I suggested we try him on low-dose gabapentin for his right foot pain.   I saw him on 08/01/2015, at which time he reported feeling worse. He had more difficulty exercising, he had gained weight.  He had foot surgery and his balance had been worse since then. He had hammertoe surgery on 05/25/2015. Because of decrease in mobility after his surgery his weight increase. His tremor was worse. Memory was stable. I suggested we continue with Neupro 8 mg patch daily. I suggested he continue with Azilect once daily. I suggested we start him on the rytary 95 mg strength with gradual titration. However, he called in July reporting that the medication was too expensive, I therefore, switched him to Sinemet generic. He also requested a handicap placard form to be filled out which I provided in September.   Of note, he missed an appointment on 06/20/2015 due to being sick. I saw him on 12/01/2014, at which time he reported a change in his insurance, mood and memory were stable, lower extremity swelling was stable, we kept his Neupro patch at 8 mg an Azilect 1 mg once daily the same. He was working full-time, also active with his 2 teenage boys, 63 year old an 21 year old. His wife had a recent change in her job.    I saw him on 08/02/2014, at which time he reported doing well. He had some leg swelling. His right hand swelling was a little worse. Cognitively and mood wise he was stable. Balance may have been off at times but generally speaking he was doing well.    I saw him on 04/05/2014, at which time he reported feeling fairly stable but his wife felt his balance was not as good. He was working out with a Clinical research associate. He had picked up boxing. He was working full-time. He cut back on his sodas. I kept him on his medications, Neupro patch 6 mg and Azilect 1 mg. He had some ongoing issues with lower extremity swelling and I referred him to vascular surgery for consultation. He was seen by a vascular specialist, Dr. Scot Dock on 04/13/2014. He had Doppler studies to his lower extremities and was advised he had no DVT and good arterial flow. His swelling improved.    I saw him on 12/03/2013, at which time he reported  doing well and had noted a benefit from Neupro patch 6 mg. I continued him on this dose as well as Azilect 1 mg once daily. His exam was stable.   I saw him on 07/28/2013, at which time he reported some worsening of his tremor and his gait. He was able to tolerate neupro patch 4 mg strength. He has had some skin irritation, most likely from the adhesive. He was not exercising regularly but endorsed being active and working full-time. His memory was stable. He denies any impulse control disorder but had mild daytime somnolence which was not as severe as when he was on Requip long-acting.     I saw him on 02/05/2013, at which time I felt his physical exam was a little worse since stopping Requip XL. He had stopped this because of daytime somnolence and severe nausea. I suggested we start him on Neupro patch. I provided him with samples. We talked about doing a sleep study down the [place].     I first met him on 07/15/2012, at which time I suggested he start taking coenzyme Q 10. I also suggested that he switch his Requip XL 4  mg to nighttime because of report of daytime somnolence. I did not increase make any other changes to his medications and felt that he was overall stable.     He previously followed with Dr. Jeneen Rinks love and was last seen by him on 03/17/2012 at which time Dr. Erling Cruz increase his Requip XL and continued him on Azilect. He was diagnosed in 12/2008, and Sx go back to a year prior to that.   MRI brain without contrast was done in the past. He has been on rasagiline, which improved his tremor. 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 There is no family history of tremor. There is no history of REM behavior disorder. He has had no falls, or hallucinations or involuntary movements otherwise. He exercises not very regularly. His memory and mood have been stable. He has no problems with lower extremities swelling or compulsive thoughts or gambling. He  works full-time as a Engineer, site.      His Past Medical History Is Significant For: Past Medical History:  Diagnosis Date   Calculus of kidney    Lesion of ulnar nerve    Paralysis agitans ([place])    Parkinson's disease ([place])    Sleep apnea    cpap - setting at 7    Sleep disturbance, unspecified     His Past Surgical History Is Significant For: Past Surgical History:  Procedure Laterality Date   CYSTOSCOPY WITH RETROGRADE PYELOGRAM, URETEROSCOPY AND STENT PLACEMENT Right 04/14/2019   Procedure: CYSTOSCOPY WITH RETROGRADE PYELOGRAM, URETEROSCOPY AND STENT PLACEMENT;  Surgeon: Lucas Mallow, MD;  Location: [facility];  Service: Urology;  Laterality: Right;   DBS surgery      HAMMERTOE RECONSTRUCTION WITH WEIL OSTEOTOMY Right 05/25/2015   Procedure: RIGHT SECOND TOE METATARSAL WEIL OSTEOTOMY  HAMMERTOE CORRECTION COLLATERAL LIGAMENT REPAIR; FLEXOR TO EXTENSOR TRANSFER ;  Surgeon: Wylene Simmer, MD;  Location: [place];  Service: Orthopedics;  Laterality: Right;   kidney stones  1991    His Family History Is Significant For: Family History  Problem Relation Age of Onset   Sleep apnea Mother    Heart failure Father    Cancer Father    Diabetes Father    Sleep apnea Brother    Parkinson's disease Neg Hx     His Social History Is Significant For: Social History   Socioeconomic History   Marital status: Married    Spouse name: Juliann Pulse    Number of children: 2   Years of education: Not on file   Highest education level: Bachelor's degree (e.g., BA, AB, BS)  Occupational History   Not on file  Tobacco Use   Smoking status: Never   Smokeless tobacco: Never  Vaping Use   Vaping Use: Never used  Substance and Sexual Activity   Alcohol use: Not Currently    Comment: icc   Drug use: No   Sexual activity: Not on file  Other Topics Concern   Not on file  Social History Narrative   Consumes 1 soda a day    Social Determinants of Health   Financial  Resource Strain: Not on file  Food Insecurity: Not on file  Transportation Needs: Not on file  Physical Activity: Not on file  Stress: Not on file  Social Connections: Not on file    His Allergies Are:  No Known Allergies:   His Current Medications Are:  Outpatient Encounter Medications as of 11/08/2020  Medication  Sig   carbidopa-levodopa (SINEMET IR) 25-100 MG tablet Take 1.5 tablets by mouth 4 (four) times daily. At 8, 12, 4 PM and 8 PM (Patient taking differently: Take 1.5 tablets by mouth 5 (five) times daily.)   gabapentin (NEURONTIN) 100 MG capsule Take 1 to 3 pills each night at bedtime for restless leg syndrome.   rasagiline (AZILECT) 1 MG TABS tablet Take 1 tablet (1 mg total) by mouth daily.   rotigotine (NEUPRO) 4 MG/24HR Place 1 patch onto the skin daily.   No facility-administered encounter medications on file as of 11/08/2020.  :  Review of Systems:  Out of a complete 14 point review of systems, all are reviewed and negative with the exception of these symptoms as listed below:  Review of Systems  Neurological:        Pt is here for follow up visit with parkinson's disesase . Pt has no concerns or questions . Pt states nothing has changes since the last time he was here.     Objective:  Neurological Exam  Physical Exam Physical Examination:   Vitals:   11/08/20 0928  BP: 114/75  Pulse: 82    General Examination: The patient is a very pleasant 65 y.o. male in no acute distress. He appears well-developed and well-nourished and well groomed.   HEENT: Normocephalic, unremarkable scar left parietal scalp from DBS. Pupils are equal, round and reactive to light, extraocular tracking shows mild saccadic breakdown without nystagmus noted. There is no limitation to his gaze. There is mild decrease in eye blink rate. Hearing is intact. Face is symmetric with moderate facial masking, no lip, neck or jaw tremor. Oropharynx exam reveals moderate airway crowding. There is no  sialorrhea.  Speech with mild hypophonia.  No dysarthria.  Chest: is clear to auscultation without wheezing, rhonchi or crackles noted.   Heart: sounds are regular and normal without murmurs, rubs or gallops noted.    Abdomen: is soft, non-tender and non-distended.   Extremities: There trace to 1+ pitting edema in the distal lower extremities bilaterally, improved from before.     Skin: is warm and dry with no trophic changes noted. Chronic swelling type changes and mild redness in both distal lower extremities, better.   Musculoskeletal: exam reveals right hip pain, mild decrease in range of motion in the right shoulder.  (He is status post right second toe hammertoe repair).   Neurologically:  Mental status: The patient is awake and alert, paying good attention. He is able to completely provide the history. He is oriented to: person, place, time/date, situation, day of week, month of year and year. His memory, attention, language and knowledge are intact. There is no aphasia, agnosia, apraxia or anomia. There is perhaps mild bradyphrenia. Speech is mildly hypophonic with no dysarthria noted. Mood is congruent and affect is normal.    Cranial nerves are as described above under HEENT exam. In addition, shoulder shrug is normal, but right shoulder is a little higher than left shoulder, stable findings.   Motor exam: Normal bulk, and strength for age is noted. There are no dyskinesias noted.  Tone is mild to moderately rigid with presence of cogwheeling in the right upper extremity and RLE. There is overall moderate bradykinesia. There is no drift or rebound.  There is an intermittent mild resting tremor in the LUE.  Mild left foot dyskinesia noted.   Fine motor skills exam:  moderate impairment noted on the right, mild to moderate impairment on the left, stable.  Cerebellar testing shows no dysmetria or intention tremor. There is no truncal or gait ataxia.    Sensory exam is intact to  light touch in both upper and lower extremities.   Gait, station and balance: He stands up from the seated position with no significant difficulty but does need to push up with his hands. He needs no assistance. No veering to one side is noted. Posture fairly good today, no obvious limp today. Walks with decreased arm swing bilaterally, right more so than left. Balance preserved.    Assessment and Plan:    In summary, [name] is a very pleasant 65 year old male with an underlying history of hyperlipidemia and kidney stones, s/p L DBS for PD, who presents for followup consultation of his right-sided predominant Parkinson's disease, obstructive sleep apnea on AutoPap therapy, as well as RLS. He was diagnosed with Parkinson's disease in 2010, symptoms date back to 2009. He had no telltale history of RBD, had some vivid dreams in the past. He is status post left STN DBS placement in February 2019, and has done quite well after that. He continues to be on Neupro patch. We reduced the patch to 6 mg strength in May 2018 for lower extremity swelling. I suggested we reduce his Neupro patch further to 4 mg in early 2020. His home sleep test from 12/23/2017 showed severe obstructive sleep apnea with an AHI of 46.9/h, O2 nadir of 84%.  He has established treatment on AutoPap.  He is generally compliant with his AutoPap and commended for his treatment adherence but is reminded to be consistent with keeping it on as much as possible.  He had interim programming appointments and a follow-up with Dr. Linus Mako or Elbert Ewings, nurse practitioner, on a regular basis, last appointment in July 2022, he is not quite ready to pursue his second DBS.  He did not have any programming changes in July 2022 but was advised to increase his Sinemet to 1-1/2 pills 5 times a day.  He feels that this is beneficial. I had suggested he increase his Sinemet to 1-1/2 pills 5 times a day in March 2021.  He tried it then but went back to  the 4 times a day schedule.  He is doing well currently.  He is encouraged to continue with Neupro patch 4 mg, Azilect 1 mg once daily and Sinemet 25-100 mg strength 1-1/2 pills 5 times a day.  He has tried gabapentin, we started this in July 2021, he was encouraged to increase it up to 3 pills at bedtime but he is currently using it very sparingly.  Feels like he has not needed it as much when he increase the Sinemet to 5 times a day.  He is advised to continue with his current lifestyle modifications, working on weight loss, exercising regularly since his retirement.  He has not missed work.  He tries to hydrate better with water.  He is advised to follow-up in this clinic routinely in about 8 months, he has an appointment with Elbert Ewings at [facility] in January 2023.  I answered all his questions today and he was in agreement with the plan. I spent 40 minutes in total face-to-face time and in reviewing records during pre-charting, more than 50% of which was spent in counseling and coordination of care, reviewing test results, reviewing medications and treatment regimen and/or in discussing or reviewing the diagnosis of PD, the prognosis and treatment options. Pertinent laboratory and imaging test results that were  available during this visit with the patient were reviewed by me and considered in my medical decision making (see chart for details).

## 2020-12-11 ENCOUNTER — Encounter (HOSPITAL_BASED_OUTPATIENT_CLINIC_OR_DEPARTMENT_OTHER): Payer: Self-pay | Admitting: Emergency Medicine

## 2020-12-11 ENCOUNTER — Emergency Department (HOSPITAL_BASED_OUTPATIENT_CLINIC_OR_DEPARTMENT_OTHER)
Admission: EM | Admit: 2020-12-11 | Discharge: 2020-12-11 | Disposition: A | Discharge status: Home / Self Care | Payer: Managed Care, Other (non HMO) | Attending: Emergency Medicine | Admitting: Emergency Medicine

## 2020-12-11 ENCOUNTER — Emergency Department (HOSPITAL_BASED_OUTPATIENT_CLINIC_OR_DEPARTMENT_OTHER): Payer: Managed Care, Other (non HMO)

## 2020-12-11 ENCOUNTER — Other Ambulatory Visit: Payer: Self-pay

## 2020-12-11 DIAGNOSIS — R1031 Right lower quadrant pain: Secondary | ICD-10-CM | POA: Diagnosis present

## 2020-12-11 DIAGNOSIS — K4090 Unilateral inguinal hernia, without obstruction or gangrene, not specified as recurrent: Secondary | ICD-10-CM | POA: Diagnosis not present

## 2020-12-11 DIAGNOSIS — G20 Parkinson's disease: Secondary | ICD-10-CM | POA: Insufficient documentation

## 2020-12-11 LAB — URINALYSIS, ROUTINE W REFLEX MICROSCOPIC
Bilirubin Urine: NEGATIVE
Glucose, UA: NEGATIVE mg/dL
Hgb urine dipstick: NEGATIVE
Ketones, ur: NEGATIVE mg/dL
Leukocytes,Ua: NEGATIVE
Nitrite: NEGATIVE
Protein, ur: NEGATIVE mg/dL
Specific Gravity, Urine: 1.018 (ref 1.005–1.030)
pH: 5.5 (ref 5.0–8.0)

## 2020-12-11 LAB — CBC
HCT: 45.3 % (ref 39.0–52.0)
Hemoglobin: 15 g/dL (ref 13.0–17.0)
MCH: 27.4 pg (ref 26.0–34.0)
MCHC: 33.1 g/dL (ref 30.0–36.0)
MCV: 82.7 fL (ref 80.0–100.0)
Platelets: 234 10*3/uL (ref 150–400)
RBC: 5.48 MIL/uL (ref 4.22–5.81)
RDW: 13.4 % (ref 11.5–15.5)
WBC: 6.9 10*3/uL (ref 4.0–10.5)
nRBC: 0 % (ref 0.0–0.2)

## 2020-12-11 LAB — COMPREHENSIVE METABOLIC PANEL
ALT: <5 U/L (ref 0–44)
AST: 17 U/L (ref 15–41)
Albumin: 4.5 g/dL (ref 3.5–5.0)
Alkaline Phosphatase: 64 U/L (ref 38–126)
Anion gap: 9 (ref 5–15)
BUN: 14 mg/dL (ref 8–23)
CO2: 28 mmol/L (ref 22–32)
Calcium: 9.6 mg/dL (ref 8.9–10.3)
Chloride: 101 mmol/L (ref 98–111)
Creatinine, Ser: 0.83 mg/dL (ref 0.61–1.24)
GFR, Estimated: >60 mL/min (ref >60)
Glucose, Bld: 97 mg/dL (ref 70–99)
Potassium: 4.1 mmol/L (ref 3.5–5.1)
Sodium: 138 mmol/L (ref 135–145)
Total Bilirubin: 0.6 mg/dL (ref 0.3–1.2)
Total Protein: 7.5 g/dL (ref 6.5–8.1)

## 2020-12-11 LAB — LIPASE, BLOOD: Lipase: 16 U/L (ref 11–51)

## 2020-12-11 MED ORDER — HYDROCODONE-ACETAMINOPHEN 5-325 MG PO TABS
1.0000 | ORAL_TABLET | Freq: Once | ORAL | Status: AC
  Administered 2020-12-11: 1 via ORAL
  Filled 2020-12-11: qty 1

## 2020-12-11 MED ORDER — HYDROCODONE-ACETAMINOPHEN 5-325 MG PO TABS: 1.0000 | ORAL_TABLET | Freq: Four times a day (QID) | ORAL | 0 refills | Status: DC | PRN

## 2020-12-11 MED ORDER — ONDANSETRON HCL 4 MG PO TABS: 4.0000 mg | ORAL_TABLET | Freq: Three times a day (TID) | ORAL | 0 refills | Status: DC | PRN

## 2020-12-11 MED ORDER — IOHEXOL 300 MG/ML  SOLN
100.0000 mL | Freq: Once | INTRAMUSCULAR | Status: AC | PRN
  Administered 2020-12-11: 100 mL via INTRAVENOUS

## 2020-12-11 NOTE — ED Triage Notes (Signed)
Pt via pov from home with right groin pain since Friday and abdominal pain that has been going on for a while, and he is not sure it is related. Pt states pain has gotten worse, and is worse when he attempts standing. He reports tenderness upon palpation. Pt alert & oriented, nad noted.

## 2020-12-11 NOTE — Discharge Instructions (Signed)
You were seen in the emergency department for right groin pain.  You had a CAT scan that showed a right inguinal hernia.  This may require operative repair.  Please continue to use ice to the sected area and continue use ibuprofen.  We are prescribing some nausea medication and pain medication.  Contact Dr. Janee Morn at [facility] to see him or one of his partners as soon as possible.  Return to the emergency department if any worsening or concerning symptoms.

## 2020-12-11 NOTE — ED Provider Notes (Signed)
[name] EMERGENCY DEPT Provider Note   CSN: [id number] Arrival date & time: 12/11/20  1559     History Chief Complaint  Patient presents with   Groin Pain    [name] is a 65 y.o. male.  He has a past medical history of Parkinson's disease.  He is complaining of right groin pain that started acutely 3 days ago when he was golfing and swung hard.  It improved somewhat with ibuprofen and rest and ice.  Felt worse today and so he decided to come have an evaluation.  Not associate with any nausea or vomiting or fevers.  Normal bowel movements.  He does think he was told he has a hernia there in the past.  The history is provided by the patient.  Groin Pain This is a new problem. The current episode started more than 2 days ago. The problem occurs constantly. The problem has not changed since onset.Pertinent negatives include no chest pain, no abdominal pain, no headaches and no shortness of breath. The symptoms are aggravated by bending, walking, coughing and sneezing. The symptoms are relieved by ice and NSAIDs. He has tried rest for the symptoms. The treatment provided mild relief.      Past Medical History:  Diagnosis Date   Calculus of kidney    Lesion of ulnar nerve    Paralysis agitans ([place])    Parkinson's disease ([place])    Sleep apnea    cpap - setting at 7    Sleep disturbance, unspecified     Patient Active Problem List   Diagnosis Date Noted   Obstructive sleep apnea treated with continuous positive airway pressure (CPAP) 03/31/2018   Parkinson's disease ([place]) 07/15/2012    Past Surgical History:  Procedure Laterality Date   CYSTOSCOPY WITH RETROGRADE PYELOGRAM, URETEROSCOPY AND STENT PLACEMENT Right 04/14/2019   Procedure: CYSTOSCOPY WITH RETROGRADE PYELOGRAM, URETEROSCOPY AND STENT PLACEMENT;  Surgeon: Lucas Mallow, MD;  Location: [facility];  Service: Urology;  Laterality: Right;   DBS surgery      HAMMERTOE RECONSTRUCTION WITH WEIL  OSTEOTOMY Right 05/25/2015   Procedure: RIGHT SECOND TOE METATARSAL WEIL OSTEOTOMY  HAMMERTOE CORRECTION COLLATERAL LIGAMENT REPAIR; FLEXOR TO EXTENSOR TRANSFER ;  Surgeon: Wylene Simmer, MD;  Location: [facility];  Service: Orthopedics;  Laterality: Right;   kidney stones  1991       Family History  Problem Relation Age of Onset   Sleep apnea Mother    Heart failure Father    Cancer Father    Diabetes Father    Sleep apnea Brother    Parkinson's disease Neg Hx     Social History   Tobacco Use   Smoking status: Never   Smokeless tobacco: Never  Vaping Use   Vaping Use: Never used  Substance Use Topics   Alcohol use: Not Currently    Comment: icc   Drug use: No    Home Medications Prior to Admission medications   Medication Sig Start Date End Date Taking? Authorizing Provider  carbidopa-levodopa (SINEMET IR) 25-100 MG tablet Take 1.5 tablets by mouth 4 (four) times daily. At 8, 12, 4 PM and 8 PM Patient taking differently: Take 1.5 tablets by mouth 5 (five) times daily. 08/23/19  Yes Star Age, MD  gabapentin (NEURONTIN) 100 MG capsule Take 1 to 3 pills each night at bedtime for restless leg syndrome. 03/13/20  Yes Star Age, MD  rasagiline (AZILECT) 1 MG TABS tablet Take 1 tablet (1 mg total)  by mouth daily. 10/12/20  Yes Star Age, MD  rotigotine (NEUPRO) 4 MG/24HR Place 1 patch onto the skin daily. 11/08/20  Yes Star Age, MD    Allergies    Patient has no known allergies.  Review of Systems   Review of Systems  Constitutional:  Negative for fever.  HENT:  Negative for sore throat.   Eyes:  Negative for visual disturbance.  Respiratory:  Negative for shortness of breath.   Cardiovascular:  Negative for chest pain.  Gastrointestinal:  Negative for abdominal pain, nausea and vomiting.  Genitourinary:  Negative for dysuria and testicular pain.  Musculoskeletal:  Negative for neck pain.  Skin:  Negative for rash.  Neurological:  Negative for  headaches.   Physical Exam Updated Vital Signs BP 138/90   Pulse 68   Temp 98.5 F (36.9 C)   Resp 18   Ht 6' 2.5" (1.892 m)   Wt 122.5 kg   SpO2 97%   BMI 34.20 kg/m   Physical Exam Vitals and nursing note reviewed.  Constitutional:      Appearance: Normal appearance. He is well-developed.  HENT:     Head: Normocephalic and atraumatic.  Eyes:     Conjunctiva/sclera: Conjunctivae normal.  Cardiovascular:     Rate and Rhythm: Normal rate and regular rhythm.     Heart sounds: No murmur heard. Pulmonary:     Effort: Pulmonary effort is normal. No respiratory distress.     Breath sounds: Normal breath sounds.  Abdominal:     Palpations: Abdomen is soft.     Tenderness: There is no abdominal tenderness. There is no guarding or rebound.     Hernia: A hernia is present. Hernia is present in the right inguinal area.     Comments: He is a tender bulge in his right groin.  It is soft and with no overlying erythema.  Genitourinary:    Pubic Area: No rash.      Testes:        Right: Tenderness not present.        Left: Tenderness not present.  Musculoskeletal:        General: No deformity or signs of injury. Normal range of motion.     Cervical back: Neck supple.  Lymphadenopathy:     Lower Body: No right inguinal adenopathy.  Skin:    General: Skin is warm and dry.  Neurological:     General: No focal deficit present.     Mental Status: He is alert.    ED Results / Procedures / Treatments   Labs (all labs ordered are listed, but only abnormal results are displayed) Labs Reviewed  LIPASE, BLOOD  COMPREHENSIVE METABOLIC PANEL  CBC  URINALYSIS, ROUTINE W REFLEX MICROSCOPIC    EKG None  Radiology CT ABDOMEN PELVIS W CONTRAST  Result Date: 12/11/2020 CLINICAL DATA:  Abdominal pain, right groin pain. Evaluate for hernia. EXAM: CT ABDOMEN AND PELVIS WITH CONTRAST TECHNIQUE: Multidetector CT imaging of the abdomen and pelvis was performed using the standard protocol  following bolus administration of intravenous contrast. CONTRAST:  128mL OMNIPAQUE IOHEXOL 300 MG/ML  SOLN COMPARISON:  CT abdomen and pelvis 03/19/2019. FINDINGS: Lower chest: No acute abnormality. Hepatobiliary: No focal liver abnormality is seen. No gallstones, gallbladder wall thickening, or biliary dilatation. Pancreas: Unremarkable. No pancreatic ductal dilatation or surrounding inflammatory changes. Spleen: Normal in size without focal abnormality. Adrenals/Urinary Tract: Adrenal glands are unremarkable. Kidneys are normal, without renal calculi, focal lesion, or hydronephrosis. The bilateral adrenal glands and  left kidney are within normal limits. Right extrarenal pelvis and mild dilatation of the right ureter without obstructing calculus appears unchanged from the prior. No hydronephrosis. A small portion of the bladder wall is stretched toward right inguinal hernia similar to the prior study. Stomach/Bowel: Stomach is within normal limits. Appendix is not seen. No evidence of bowel wall thickening, distention, or inflammatory changes. There is sigmoid colon diverticulosis without evidence for acute diverticulitis. Vascular/Lymphatic: Aortic atherosclerosis. No enlarged abdominal or pelvic lymph nodes. Reproductive: Prostate is unremarkable. Other: There is a moderate to large sized right inguinal hernia. There is new nondilated small bowel within the hernia sac. There is no fluid in the hernia sac. Musculoskeletal: No acute or significant osseous findings. IMPRESSION: 1. Moderate to large size right inguinal hernia now contains nondilated small bowel, a new finding. No evidence for bowel obstruction. 2. There is stable chronic right hydroureter likely due to stricture. No obstructing calculus or mass identified. Electronically Signed   By: Darliss Cheney M.D.   On: 12/11/2020 18:58    Procedures Procedures   Medications Ordered in ED Medications  iohexol (OMNIPAQUE) 300 MG/ML solution 100 mL (100 mLs  Intravenous Contrast Given 12/11/20 1836)    ED Course  I have reviewed the triage vital signs and the nursing notes.  Pertinent labs & imaging results that were available during my care of the patient were reviewed by me and considered in my medical decision making (see chart for details).  Clinical Course as of 12/12/20 1031  Mon Dec 11, 2020  2056 Consulted with Dr. Corliss Skains, [facility].  He reviewed the CAT scan and felt that the patient was appropriate for outpatient follow-up. [MB]    Clinical Course User Index [MB] Terrilee Files, MD   MDM Rules/Calculators/A&P                          This patient complains of pain in right groin; this involves an extensive number of treatment Options and is a complaint that carries with it a high risk of complications and Morbidity. The differential includes hernia, incarcerated hernia, obstruction, testicular mass, torsion  I ordered, reviewed and interpreted labs, which included CBC with normal white count normal hemoglobin, chemistries and LFTs normal, urinalysis without signs of infection I ordered medication oral pain medication with improvement in his symptoms I ordered imaging studies which included CT abdomen and pelvis and I independently    visualized and interpreted imaging which showed inguinal hernia on the right with small bowel no evidence of obstruction Additional history obtained from patient's spouse Previous records obtained and reviewed in epic no recent admissions I consulted general surgery Dr. Corliss Skains and discussed lab and imaging findings  Critical Interventions: None  After the interventions stated above, I reevaluated the patient and found patient be symptomatically improved.  No evidence of incarceration or obstruction.  Commended close follow-up outpatient general surgery.  Clear return instructions discussed.   Final Clinical Impression(s) / ED Diagnoses Final diagnoses:  Right inguinal hernia     Rx / DC Orders ED Discharge Orders          Ordered    HYDROcodone-acetaminophen (NORCO/VICODIN) 5-325 MG tablet  Every 6 hours PRN        12/11/20 2108    ondansetron (ZOFRAN) 4 MG tablet  Every 8 hours PRN        12/11/20 2108  Hayden Rasmussen, MD 12/12/20 [phone]

## 2020-12-12 ENCOUNTER — Encounter: Payer: Self-pay | Admitting: Neurology

## 2020-12-15 ENCOUNTER — Ambulatory Visit: Payer: Self-pay | Admitting: General Surgery

## 2020-12-15 ENCOUNTER — Other Ambulatory Visit (HOSPITAL_BASED_OUTPATIENT_CLINIC_OR_DEPARTMENT_OTHER): Payer: Self-pay

## 2020-12-15 NOTE — H&P (Signed)
PROVIDER:  Janey Greaser, MD   MRN: [id number] DOB: Apr 30, 1955 DATE OF ENCOUNTER: 12/15/2020 Subjective    Chief Complaint: Right Inguinal Hernia       History of Present Illness: [name] is a 65 y.o. male who is seen today for RIH   I previously saw [name] for right inguinal hernia and epigastric pain his epigastric work-up has been negative.  His right inguinal hernia has been asymptomatic.  About a week ago he had significant pain in the hernia and was seen at the emergency department.  His work-up there included CT scan which showed right inguinal hernia containing small bowel without signs of obstruction or incarceration.  He has been having pain since then.  He says the hernia goes in and out.  He has taken hydrocodone as well as Advil.   Review of Systems: A complete review of systems was obtained from the patient.  I have reviewed this information and discussed as appropriate with the patient.  See HPI as well for other ROS.   ROS      Medical History: Past Medical History      Past Medical History:  Diagnosis Date   Sleep apnea          There is no problem list on file for this patient.     Past Surgical History       Past Surgical History:  Procedure Laterality Date   dbs parkinsons       foot surgery        ureter restriction surgery  N/A          Allergies  No Known Allergies           Current Outpatient Medications on File Prior to Visit  Medication Sig Dispense Refill   carbidopa-levodopa (SINEMET) 25-100 mg tablet carbidopa 25 mg-levodopa 100 mg tablet  TAKE 1.5 TABLETS BY MOUTH 4 (FOUR) TIMES DAILY. AT 8, 12, 4 PM AND 8 PM       rasagiline (AZILECT) 1 mg tablet rasagiline 1 mg tablet  TAKE 1 TABLET BY MOUTH EVERY DAY       rotigotine (NEUPRO) 4 mg/24 hour Neupro 4 mg/24 hour transdermal 24 hour patch  APPLY 1 PATCH ONTO THE SKIN EVERY DAY        No current facility-administered medications on file prior to visit.      Family  History       Family History  Problem Relation Age of Onset   High blood pressure (Hypertension) Mother     Diabetes Father          Social History       Tobacco Use  Smoking Status Never Smoker  Smokeless Tobacco Never Used      Social History  Social History        Socioeconomic History   Marital status: Married  Tobacco Use   Smoking status: Never Smoker   Smokeless tobacco: Never Used  Substance and Sexual Activity   Alcohol use: Not Currently   Drug use: Never        Objective:      There were no vitals filed for this visit.  There is no height or weight on file to calculate BMI.   Physical Exam    General appearance - alert, well appearing, and in no distress Neck - supple, no significant adenopathy Chest - clear to auscultation, no wheezes, rales or rhonchi, symmetric air entry Heart - normal rate, regular rhythm, normal S1,  S2, no murmurs, rubs, clicks or gallops Abdomen - soft, nontender, nondistended, no masses or organomegaly GU Male - HERNIA EXAM: right inguinal hernia, no hernias found on exam Neurological - alert, oriented, normal speech, no focal findings or movement disorder noted Addendum/correction: Right inguinal hernia is present.  It is reducible but spontaneously recurs.  Mild discomfort.   Labs, Imaging and Diagnostic Testing:   CT     Assessment and Plan:  Diagnoses and all orders for this visit:   Non-recurrent unilateral inguinal hernia without obstruction or gangrene   Other orders -     HYDROcodone-acetaminophen (NORCO) 5-325 mg tablet; Take 1 tablet by mouth every 6 (six) hours as needed for Pain for up to 5 days       Symptomatic right inguinal hernia.  I have offered right inguinal hernia repair with mesh.  We will plan this urgently due to his symptoms.  I discussed the procedure, risks, and benefits.  I gave him Transport planner.  I look forward to scheduling next week.   No follow-ups on file.   Janey Greaser, MD

## 2020-12-15 NOTE — H&P (View-Only) (Signed)
PROVIDER:  Janey Greaser, MD   MRN: [id number] DOB: Apr 30, 1955 DATE OF ENCOUNTER: 12/15/2020 Subjective    Chief Complaint: Right Inguinal Hernia       History of Present Illness: [name] is a 65 y.o. male who is seen today for RIH   I previously saw [name] for right inguinal hernia and epigastric pain his epigastric work-up has been negative.  His right inguinal hernia has been asymptomatic.  About a week ago he had significant pain in the hernia and was seen at the emergency department.  His work-up there included CT scan which showed right inguinal hernia containing small bowel without signs of obstruction or incarceration.  He has been having pain since then.  He says the hernia goes in and out.  He has taken hydrocodone as well as Advil.   Review of Systems: A complete review of systems was obtained from the patient.  I have reviewed this information and discussed as appropriate with the patient.  See HPI as well for other ROS.   ROS      Medical History: Past Medical History      Past Medical History:  Diagnosis Date   Sleep apnea          There is no problem list on file for this patient.     Past Surgical History       Past Surgical History:  Procedure Laterality Date   dbs parkinsons       foot surgery        ureter restriction surgery  N/A          Allergies  No Known Allergies           Current Outpatient Medications on File Prior to Visit  Medication Sig Dispense Refill   carbidopa-levodopa (SINEMET) 25-100 mg tablet carbidopa 25 mg-levodopa 100 mg tablet  TAKE 1.5 TABLETS BY MOUTH 4 (FOUR) TIMES DAILY. AT 8, 12, 4 PM AND 8 PM       rasagiline (AZILECT) 1 mg tablet rasagiline 1 mg tablet  TAKE 1 TABLET BY MOUTH EVERY DAY       rotigotine (NEUPRO) 4 mg/24 hour Neupro 4 mg/24 hour transdermal 24 hour patch  APPLY 1 PATCH ONTO THE SKIN EVERY DAY        No current facility-administered medications on file prior to visit.      Family  History       Family History  Problem Relation Age of Onset   High blood pressure (Hypertension) Mother     Diabetes Father          Social History       Tobacco Use  Smoking Status Never Smoker  Smokeless Tobacco Never Used      Social History  Social History        Socioeconomic History   Marital status: Married  Tobacco Use   Smoking status: Never Smoker   Smokeless tobacco: Never Used  Substance and Sexual Activity   Alcohol use: Not Currently   Drug use: Never        Objective:      There were no vitals filed for this visit.  There is no height or weight on file to calculate BMI.   Physical Exam    General appearance - alert, well appearing, and in no distress Neck - supple, no significant adenopathy Chest - clear to auscultation, no wheezes, rales or rhonchi, symmetric air entry Heart - normal rate, regular rhythm, normal S1,  S2, no murmurs, rubs, clicks or gallops Abdomen - soft, nontender, nondistended, no masses or organomegaly GU Male - HERNIA EXAM: right inguinal hernia, no hernias found on exam Neurological - alert, oriented, normal speech, no focal findings or movement disorder noted Addendum/correction: Right inguinal hernia is present.  It is reducible but spontaneously recurs.  Mild discomfort.   Labs, Imaging and Diagnostic Testing:   CT     Assessment and Plan:  Diagnoses and all orders for this visit:   Non-recurrent unilateral inguinal hernia without obstruction or gangrene   Other orders -     HYDROcodone-acetaminophen (NORCO) 5-325 mg tablet; Take 1 tablet by mouth every 6 (six) hours as needed for Pain for up to 5 days       Symptomatic right inguinal hernia.  I have offered right inguinal hernia repair with mesh.  We will plan this urgently due to his symptoms.  I discussed the procedure, risks, and benefits.  I gave him Transport planner.  I look forward to scheduling next week.   No follow-ups on file.   Janey Greaser, MD

## 2020-12-20 ENCOUNTER — Encounter (HOSPITAL_COMMUNITY): Payer: Self-pay | Admitting: General Surgery

## 2020-12-20 ENCOUNTER — Other Ambulatory Visit: Payer: Self-pay

## 2020-12-20 NOTE — Progress Notes (Signed)
DUE TO COVID-19 ONLY ONE VISITOR IS ALLOWED TO COME WITH YOU AND STAY IN THE WAITING ROOM ONLY DURING PRE OP AND PROCEDURE DAY OF SURGERY.   PCP - Peri Maris, NP Cardiologist - n/a Neurology - Dr Huston Foley  Chest x-ray - n/a EKG - n/a Stress Test - 09/16/08 ECHO - 09/16/08 Cardiac Cath - n/a  ICD Pacemaker/Loop - n/a  Sleep Study -  Yes CPAP - occasional uses CPAP  Diabetes - n/a  ERAS: Clear liquids til 6:15 am on DOS.  Reviewed clear liquids with patient.  STOP now taking any Aspirin (unless otherwise instructed by your surgeon), Aleve, Naproxen, Ibuprofen, Motrin, Advil, Goody's, BC's, all herbal medications, fish oil, and all vitamins.   Coronavirus Screening Covid test n/a Ambulatory Surgery Do you have any of the following symptoms:  Cough yes/no: No Fever (>100.39F)  yes/no: No Runny nose yes/no: No Sore throat yes/no: No Difficulty breathing/shortness of breath  yes/no: No  Have you traveled in the last 14 days and where? yes/no: No  Patient verbalized understanding of instructions that were given via phone.

## 2020-12-21 ENCOUNTER — Ambulatory Visit (HOSPITAL_COMMUNITY): Payer: Managed Care, Other (non HMO) | Admitting: Certified Registered"

## 2020-12-21 ENCOUNTER — Encounter (HOSPITAL_COMMUNITY): Payer: Self-pay | Admitting: General Surgery

## 2020-12-21 ENCOUNTER — Ambulatory Visit (HOSPITAL_COMMUNITY)
Admission: RE | Admit: 2020-12-21 | Discharge: 2020-12-21 | Disposition: A | Discharge status: Home / Self Care | Payer: Managed Care, Other (non HMO) | Attending: General Surgery | Admitting: General Surgery

## 2020-12-21 ENCOUNTER — Ambulatory Visit (HOSPITAL_COMMUNITY): Payer: Managed Care, Other (non HMO)

## 2020-12-21 ENCOUNTER — Encounter (HOSPITAL_COMMUNITY)
Admission: RE | Disposition: A | Discharge status: Home / Self Care | Payer: Self-pay | Source: Home / Self Care | Attending: General Surgery

## 2020-12-21 DIAGNOSIS — K4090 Unilateral inguinal hernia, without obstruction or gangrene, not specified as recurrent: Secondary | ICD-10-CM | POA: Diagnosis not present

## 2020-12-21 DIAGNOSIS — G2581 Restless legs syndrome: Secondary | ICD-10-CM

## 2020-12-21 DIAGNOSIS — G20 Parkinson's disease: Secondary | ICD-10-CM | POA: Diagnosis not present

## 2020-12-21 DIAGNOSIS — Z419 Encounter for procedure for purposes other than remedying health state, unspecified: Secondary | ICD-10-CM

## 2020-12-21 DIAGNOSIS — G4730 Sleep apnea, unspecified: Secondary | ICD-10-CM | POA: Insufficient documentation

## 2020-12-21 HISTORY — PX: INGUINAL HERNIA REPAIR: SHX194

## 2020-12-21 HISTORY — PX: INSERTION OF MESH: SHX5868

## 2020-12-21 SURGERY — REPAIR, HERNIA, INGUINAL, ADULT
Anesthesia: General | Site: Groin | Laterality: Right

## 2020-12-21 MED ORDER — PROPOFOL 10 MG/ML IV BOLUS
INTRAVENOUS | Status: DC | PRN
  Administered 2020-12-21: 200 mg via INTRAVENOUS

## 2020-12-21 MED ORDER — CHLORHEXIDINE GLUCONATE CLOTH 2 % EX PADS: 6.0000 | MEDICATED_PAD | Freq: Once | CUTANEOUS | Status: DC

## 2020-12-21 MED ORDER — CHLORHEXIDINE GLUCONATE 0.12 % MT SOLN
OROMUCOSAL | Status: AC
  Administered 2020-12-21: 15 mL via OROMUCOSAL
  Filled 2020-12-21: qty 15

## 2020-12-21 MED ORDER — MIDAZOLAM HCL 2 MG/2ML IJ SOLN
INTRAMUSCULAR | Status: AC
  Filled 2020-12-21: qty 2

## 2020-12-21 MED ORDER — ORAL CARE MOUTH RINSE: 15.0000 mL | Freq: Once | OROMUCOSAL | Status: AC

## 2020-12-21 MED ORDER — OXYCODONE HCL 5 MG PO TABS
ORAL_TABLET | ORAL | Status: AC
  Filled 2020-12-21: qty 1

## 2020-12-21 MED ORDER — CEFAZOLIN IN SODIUM CHLORIDE 3-0.9 GM/100ML-% IV SOLN: 3.0000 g | INTRAVENOUS | Status: DC

## 2020-12-21 MED ORDER — ACETAMINOPHEN 10 MG/ML IV SOLN
1000.0000 mg | Freq: Four times a day (QID) | INTRAVENOUS | Status: DC
  Administered 2020-12-21: 1000 mg via INTRAVENOUS

## 2020-12-21 MED ORDER — FENTANYL CITRATE (PF) 250 MCG/5ML IJ SOLN
INTRAMUSCULAR | Status: AC
  Filled 2020-12-21: qty 5

## 2020-12-21 MED ORDER — LIDOCAINE 2% (20 MG/ML) 5 ML SYRINGE
INTRAMUSCULAR | Status: AC
  Filled 2020-12-21: qty 5

## 2020-12-21 MED ORDER — MIDAZOLAM HCL 2 MG/2ML IJ SOLN
INTRAMUSCULAR | Status: DC | PRN
  Administered 2020-12-21: 2 mg via INTRAVENOUS

## 2020-12-21 MED ORDER — ROCURONIUM BROMIDE 10 MG/ML (PF) SYRINGE
PREFILLED_SYRINGE | INTRAVENOUS | Status: AC
  Filled 2020-12-21: qty 10

## 2020-12-21 MED ORDER — ONDANSETRON HCL 4 MG/2ML IJ SOLN
INTRAMUSCULAR | Status: DC | PRN
  Administered 2020-12-21: 4 mg via INTRAVENOUS

## 2020-12-21 MED ORDER — ROCURONIUM BROMIDE 10 MG/ML (PF) SYRINGE
PREFILLED_SYRINGE | INTRAVENOUS | Status: DC | PRN
  Administered 2020-12-21: 60 mg via INTRAVENOUS
  Administered 2020-12-21: 40 mg via INTRAVENOUS

## 2020-12-21 MED ORDER — KETOROLAC TROMETHAMINE 30 MG/ML IJ SOLN
30.0000 mg | Freq: Once | INTRAMUSCULAR | Status: AC
  Administered 2020-12-21: 30 mg via INTRAVENOUS

## 2020-12-21 MED ORDER — 0.9 % SODIUM CHLORIDE (POUR BTL) OPTIME
TOPICAL | Status: DC | PRN
  Administered 2020-12-21: 100 mL

## 2020-12-21 MED ORDER — OXYCODONE HCL 5 MG PO TABS
5.0000 mg | ORAL_TABLET | Freq: Once | ORAL | Status: AC
  Administered 2020-12-21: 5 mg via ORAL

## 2020-12-21 MED ORDER — ACETAMINOPHEN 10 MG/ML IV SOLN
INTRAVENOUS | Status: AC
  Filled 2020-12-21: qty 100

## 2020-12-21 MED ORDER — ACETAMINOPHEN 500 MG PO TABS
1000.0000 mg | ORAL_TABLET | ORAL | Status: AC
  Administered 2020-12-21: 1000 mg via ORAL
  Filled 2020-12-21: qty 2

## 2020-12-21 MED ORDER — HYDROCODONE-ACETAMINOPHEN 5-325 MG PO TABS: 1.0000 | ORAL_TABLET | Freq: Four times a day (QID) | ORAL | 0 refills | Status: DC | PRN

## 2020-12-21 MED ORDER — BUPIVACAINE-EPINEPHRINE (PF) 0.25% -1:200000 IJ SOLN
INTRAMUSCULAR | Status: AC
  Filled 2020-12-21: qty 30

## 2020-12-21 MED ORDER — CELECOXIB 200 MG PO CAPS
400.0000 mg | ORAL_CAPSULE | ORAL | Status: AC
  Administered 2020-12-21: 400 mg via ORAL
  Filled 2020-12-21: qty 2

## 2020-12-21 MED ORDER — SUGAMMADEX SODIUM 200 MG/2ML IV SOLN
INTRAVENOUS | Status: DC | PRN
  Administered 2020-12-21: 200 mg via INTRAVENOUS

## 2020-12-21 MED ORDER — CHLORHEXIDINE GLUCONATE 0.12 % MT SOLN: 15.0000 mL | Freq: Once | OROMUCOSAL | Status: AC

## 2020-12-21 MED ORDER — PROPOFOL 10 MG/ML IV BOLUS
INTRAVENOUS | Status: AC
  Filled 2020-12-21: qty 20

## 2020-12-21 MED ORDER — ROPIVACAINE HCL 5 MG/ML IJ SOLN
INTRAMUSCULAR | Status: DC | PRN
  Administered 2020-12-21: 30 mL via PERINEURAL

## 2020-12-21 MED ORDER — DEXTROSE 5 % IV SOLN
INTRAVENOUS | Status: DC | PRN
  Administered 2020-12-21: 3 g via INTRAVENOUS

## 2020-12-21 MED ORDER — KETOROLAC TROMETHAMINE 30 MG/ML IJ SOLN
INTRAMUSCULAR | Status: AC
  Filled 2020-12-21: qty 1

## 2020-12-21 MED ORDER — DEXAMETHASONE SODIUM PHOSPHATE 10 MG/ML IJ SOLN
INTRAMUSCULAR | Status: DC | PRN
  Administered 2020-12-21: 10 mg via INTRAVENOUS

## 2020-12-21 MED ORDER — FENTANYL CITRATE (PF) 250 MCG/5ML IJ SOLN
INTRAMUSCULAR | Status: DC | PRN
  Administered 2020-12-21 (×5): 50 ug via INTRAVENOUS

## 2020-12-21 MED ORDER — BUPIVACAINE-EPINEPHRINE 0.25% -1:200000 IJ SOLN
INTRAMUSCULAR | Status: DC | PRN
  Administered 2020-12-21: 10 mL

## 2020-12-21 MED ORDER — LIDOCAINE 2% (20 MG/ML) 5 ML SYRINGE
INTRAMUSCULAR | Status: DC | PRN
  Administered 2020-12-21: 100 mg via INTRAVENOUS

## 2020-12-21 MED ORDER — CEFAZOLIN IN SODIUM CHLORIDE 3-0.9 GM/100ML-% IV SOLN
INTRAVENOUS | Status: AC
  Filled 2020-12-21: qty 100

## 2020-12-21 MED ORDER — LACTATED RINGERS IV SOLN: INTRAVENOUS | Status: DC | PRN

## 2020-12-21 MED ORDER — LACTATED RINGERS IV SOLN: INTRAVENOUS | Status: DC

## 2020-12-21 SURGICAL SUPPLY — 42 items
BAG COUNTER SPONGE SURGICOUNT (BAG) ×2 IMPLANT
BLADE CLIPPER SURG (BLADE) ×1 IMPLANT
CANISTER SUCT 3000ML PPV (MISCELLANEOUS) IMPLANT
CHLORAPREP W/TINT 26 (MISCELLANEOUS) ×2 IMPLANT
CORD BIPOLAR FORCEPS 12FT (ELECTRODE) ×1 IMPLANT
COVER SURGICAL LIGHT HANDLE (MISCELLANEOUS) ×2 IMPLANT
DERMABOND ADVANCED (GAUZE/BANDAGES/DRESSINGS) ×1
DERMABOND ADVANCED .7 DNX12 (GAUZE/BANDAGES/DRESSINGS) ×1 IMPLANT
DRAIN PENROSE 1/2X12 LTX STRL (WOUND CARE) IMPLANT
DRAPE C-ARM 42X120 X-RAY (DRAPES) ×2 IMPLANT
DRAPE C-ARMOR (DRAPES) ×1 IMPLANT
DRAPE LAPAROTOMY 100X72 PEDS (DRAPES) ×2 IMPLANT
ELECT REM PT RETURN 9FT ADLT (ELECTROSURGICAL) ×2
ELECTRODE REM PT RTRN 9FT ADLT (ELECTROSURGICAL) ×1 IMPLANT
FORCEPS BIPOLAR SPETZLER 8 1.0 (NEUROSURGERY SUPPLIES) ×1 IMPLANT
GLOVE SRG 8 PF TXTR STRL LF DI (GLOVE) ×1 IMPLANT
GLOVE SURG ENC MOIS LTX SZ8 (GLOVE) ×2 IMPLANT
GLOVE SURG UNDER POLY LF SZ8 (GLOVE) ×2
GOWN STRL REUS W/ TWL LRG LVL3 (GOWN DISPOSABLE) ×1 IMPLANT
GOWN STRL REUS W/ TWL XL LVL3 (GOWN DISPOSABLE) ×1 IMPLANT
GOWN STRL REUS W/TWL LRG LVL3 (GOWN DISPOSABLE) ×2
GOWN STRL REUS W/TWL XL LVL3 (GOWN DISPOSABLE) ×2
KIT BASIN OR (CUSTOM PROCEDURE TRAY) ×2 IMPLANT
KIT TURNOVER KIT B (KITS) ×2 IMPLANT
MESH HERNIA 3X6 (Mesh General) ×1 IMPLANT
NEEDLE 22X1 1/2 (OR ONLY) (NEEDLE) ×2 IMPLANT
NS IRRIG 1000ML POUR BTL (IV SOLUTION) ×2 IMPLANT
PACK GENERAL/GYN (CUSTOM PROCEDURE TRAY) ×2 IMPLANT
PAD ARMBOARD 7.5X6 YLW CONV (MISCELLANEOUS) ×2 IMPLANT
PENCIL SMOKE EVACUATOR (MISCELLANEOUS) ×2 IMPLANT
SPECIMEN JAR SMALL (MISCELLANEOUS) IMPLANT
SUT MNCRL AB 4-0 PS2 18 (SUTURE) ×2 IMPLANT
SUT PROLENE 2 0 CT2 30 (SUTURE) ×8 IMPLANT
SUT VIC AB 2-0 SH 27 (SUTURE) ×5
SUT VIC AB 2-0 SH 27X BRD (SUTURE) ×1 IMPLANT
SUT VIC AB 2-0 SH 27XBRD (SUTURE) IMPLANT
SUT VIC AB 3-0 SH 27 (SUTURE) ×2
SUT VIC AB 3-0 SH 27X BRD (SUTURE) ×1 IMPLANT
SUT VICRYL AB 3 0 TIES (SUTURE) IMPLANT
SYR CONTROL 10ML LL (SYRINGE) ×2 IMPLANT
TOWEL GREEN STERILE (TOWEL DISPOSABLE) ×2 IMPLANT
TOWEL GREEN STERILE FF (TOWEL DISPOSABLE) ×2 IMPLANT

## 2020-12-21 NOTE — Anesthesia Preprocedure Evaluation (Signed)
Anesthesia Evaluation  Patient identified by MRN, date of birth, ID band Patient awake    Reviewed: Allergy & Precautions, NPO status , Patient's Chart, lab work & pertinent test results  History of Anesthesia Complications Negative for: history of anesthetic complications  Airway Mallampati: III  TM Distance: >3 FB Neck ROM: Full    Dental  (+) Teeth Intact, Dental Advisory Given   Pulmonary sleep apnea and Continuous Positive Airway Pressure Ventilation ,    Pulmonary exam normal        Cardiovascular negative cardio ROS Normal cardiovascular exam     Neuro/Psych Parkinson's disease     GI/Hepatic negative GI ROS, Neg liver ROS,   Endo/Other  negative endocrine ROS  Renal/GU negative Renal ROS  negative genitourinary   Musculoskeletal negative musculoskeletal ROS (+)   Abdominal   Peds  Hematology negative hematology ROS (+)   Anesthesia Other Findings   Reproductive/Obstetrics                            Anesthesia Physical Anesthesia Plan  ASA: 2 and emergent  Anesthesia Plan: General   Post-op Pain Management:    Induction: Intravenous  PONV Risk Score and Plan: 2 and Ondansetron, Dexamethasone, Treatment may vary due to age or medical condition and Midazolam  Airway Management Planned: LMA  Additional Equipment: None  Intra-op Plan:   Post-operative Plan: Extubation in OR  Informed Consent: I have reviewed the patients History and Physical, chart, labs and discussed the procedure including the risks, benefits and alternatives for the proposed anesthesia with the patient or authorized representative who has indicated his/her understanding and acceptance.     Dental advisory given  Plan Discussed with:   Anesthesia Plan Comments:         Anesthesia Quick Evaluation

## 2020-12-21 NOTE — Anesthesia Postprocedure Evaluation (Signed)
Anesthesia Post Note  Patient: [name]  Procedure(s) Performed: REPAIR RIGHT INGUINAL HERNIA REPAIR WITH MESH (Right: Groin) INSERTION OF MESH (Right: Groin)     Patient location during evaluation: PACU Anesthesia Type: General Level of consciousness: awake and alert Pain management: pain level controlled Vital Signs Assessment: post-procedure vital signs reviewed and stable Respiratory status: spontaneous breathing, nonlabored ventilation and respiratory function stable Cardiovascular status: blood pressure returned to baseline and stable Postop Assessment: no apparent nausea or vomiting Anesthetic complications: no   No notable events documented.  Last Vitals:  Vitals:   12/21/20 1306 12/21/20 1400  BP: 136/82 125/75  Pulse: 81 80  Resp: 12 14  Temp: 36.8 C 36.5 C  SpO2: 92% 92%    Last Pain:  Vitals:   12/21/20 1400  TempSrc:   PainSc: 4                  Lucretia Kern

## 2020-12-21 NOTE — Interval H&P Note (Signed)
History and Physical Interval Note:  12/21/2020 8:17 AM  Phil Dopp  has presented today for surgery, with the diagnosis of RIGHT INGUINAL HERNIA.  The various methods of treatment have been discussed with the patient and family. After consideration of risks, benefits and other options for treatment, the patient has consented to  Procedure(s): REPAIR RIGHT INGUINAL HERNIA REPAIR WITH MESH (Right) as a surgical intervention.  The patient's history has been reviewed, patient examined, no change in status, stable for surgery.  I have reviewed the patient's chart and labs.  Questions were answered to the patient's satisfaction.     Liz Malady

## 2020-12-21 NOTE — Anesthesia Procedure Notes (Addendum)
Procedure Name: Intubation Date/Time: 12/21/2020 9:25 AM Performed by: Rosiland Oz, CRNA Pre-anesthesia Checklist: Patient identified, Emergency Drugs available, Suction available and Patient being monitored Patient Re-evaluated:Patient Re-evaluated prior to induction Oxygen Delivery Method: Circle System Utilized Preoxygenation: Pre-oxygenation with 100% oxygen Induction Type: IV induction Ventilation: Mask ventilation without difficulty and Oral airway inserted - appropriate to patient size Laryngoscope Size: Miller and 3 Grade View: Grade II Tube type: Oral Tube size: 7.5 mm Number of attempts: 1 Airway Equipment and Method: Stylet and Oral airway Placement Confirmation: ETT inserted through vocal cords under direct vision, positive ETCO2 and breath sounds checked- equal and bilateral Secured at: 23 cm Tube secured with: Tape Dental Injury: Teeth and Oropharynx as per pre-operative assessment

## 2020-12-21 NOTE — Anesthesia Procedure Notes (Signed)
  Anesthesia Regional Block: TAP block   Pre-Anesthetic Checklist: , timeout performed,  Correct Patient, Correct Site, Correct Laterality,  Correct Procedure, Correct Position, site marked,  Risks and benefits discussed,  Surgical consent,  Pre-op evaluation,  At surgeon's request and post-op pain management  Laterality: Right  Prep: chloraprep       Needles:  Injection technique: Single-shot  Needle Type: Echogenic Stimulator Needle     Needle Length: 10cm  Needle Gauge: 20     Additional Needles:   Procedures:,,,, ultrasound used (permanent image in chart),,    Narrative:  Start time: 12/21/2020 9:27 AM End time: 12/21/2020 9:31 AM Injection made incrementally with aspirations every 5 mL.  Performed by: Personally  Anesthesiologist: Lucretia Kern, MD  Additional Notes: Standard monitors applied. Skin prepped. Good needle visualization with ultrasound. Injection made in 5cc increments with no resistance to injection. Patient tolerated the procedure well.

## 2020-12-21 NOTE — Op Note (Signed)
12/21/2020  12:28 PM  PATIENT:  [name]  65 y.o. male  PRE-OPERATIVE DIAGNOSIS:  RIGHT INGUINAL HERNIA  POST-OPERATIVE DIAGNOSIS:  RIGHT INGUINAL HERNIA  PROCEDURE:  Procedure(s): REPAIR RIGHT INGUINAL HERNIA  INSERTION OF MESH  SURGEON: Violeta Gelinas, MD  ASSISTANTS: Myrene Galas, MD  ANESTHESIA:   local, regional, and general  EBL:  Total I/O In: 1050 [I.V.:1000; IV Piggyback:50] Out: -   BLOOD ADMINISTERED:none  DRAINS: none   SPECIMEN:  No Specimen  DISPOSITION OF SPECIMEN:  PATHOLOGY  COUNTS:  YES  DICTATION: .Dragon Dictation Procedure in detail: Informed consent was obtained.  He received intravenous antibiotics.  His site was marked.  He was brought to the operating room and general endotracheal anesthesia was administered by the anesthesia staff.  Anesthesia placed a TAP block.  His inguinal region and abdomen were prepped and draped in a sterile fashion.  Timeout procedure was performed.  Local was injected in the right inguinal region.  Incision was made and subcutaneous tissues were dissected down.  Please note that we used bipolar cautery due to his brain stimulator.  Dissection was continued down through Scarpa's fascia to the external oblique region.  This was very blown out and attenuated due to a large hernia.  It was divided laterally and the division was continued down medially.  The superior leaflet was dissected free off the transversalis and the inferior leaflet was dissected down revealing the shelving edge of the inguinal ligament.  The cord structures were encircled with a Penrose drain.  I then dissected the cord and identified this as a very large direct inguinal hernia.  This was freed from the cord structures and reduced back into the abdomen.  It appeared to contain omentum.  A couple of 2-0 Vicryl's were placed between the shelving edge of inguinal ligament and the transversalis to hold the hernia reduced.  Next, the hernia repair was  completed with a keyhole polypropylene mesh cut to custom shape and size.  It was secured to the tissue over the pubic tubercle medially with 2-0 Prolene.  It was sewed in a running fashion inferiorly to the shelving edge of the inguinal ligament.  Superiorly it was tacked over the pubic tubercle region as well as along the transversalis with interrupted 2-0 Prolene's extending out laterally.  The 2 leaflets were rejoined behind the cord structures and tacked together with 2-0 Prolene.  There was there is also tacked to the underlying tissue carefully.  The cord remained viable and had a adequate aperture in the mesh.  At this point, I wanted to place an additional medial suture from the mesh to the tissue over the pubic tubercle.  Unfortunately, the needle became dislodged from the suture.  It was difficult to locate.  Dr. Carola Frost scrubbed in and we used fluoroscopy to identify the needle and remove it without difficulty.  An additional film was taken to prove its removal.  Subcutaneous tissues were irrigated.  What was left of the external bleak was closed with Vicryl.  Subcutaneous tissues were closed with interrupted 2-0 Vicryl as well and the skin was closed with 4-0 Monocryl subcuticular followed by Dermabond.  All counts were correct.  He tolerated the procedure well.  There were no complications.  All counts were correct at the completion of the procedure.  He was taken recovery stable condition. PATIENT DISPOSITION:  PACU - hemodynamically stable.   Delay start of Pharmacological VTE agent (>24hrs) due to surgical blood loss or risk of bleeding:  no  Violeta Gelinas, MD, MPH, FACS Pager: [phone]  11/10/202212:28 PM

## 2020-12-21 NOTE — Transfer of Care (Signed)
Immediate Anesthesia Transfer of Care Note  Patient: [name]  Procedure(s) Performed: REPAIR RIGHT INGUINAL HERNIA REPAIR WITH MESH (Right: Groin) INSERTION OF MESH (Right: Groin)  Patient Location: PACU  Anesthesia Type:General and GA combined with regional for post-op pain  Level of Consciousness: drowsy and patient cooperative  Airway & Oxygen Therapy: Patient Spontanous Breathing  Post-op Assessment: Report given to RN and Post -op Vital signs reviewed and stable  Post vital signs: Reviewed and stable  Last Vitals:  Vitals Value Taken Time  BP 131/67 12/21/20 1236  Temp    Pulse 81 12/21/20 1236  Resp 17 12/21/20 1236  SpO2 91 % 12/21/20 1236  Vitals shown include unvalidated device data.  Last Pain:  Vitals:   12/21/20 0728  TempSrc:   PainSc: 3       Patients Stated Pain Goal: 3 (12/21/20 0728)  Complications: No notable events documented.

## 2020-12-22 ENCOUNTER — Encounter (HOSPITAL_COMMUNITY): Payer: Self-pay | Admitting: General Surgery

## 2021-06-04 ENCOUNTER — Other Ambulatory Visit: Payer: Self-pay | Admitting: Student

## 2021-06-04 DIAGNOSIS — K4090 Unilateral inguinal hernia, without obstruction or gangrene, not specified as recurrent: Secondary | ICD-10-CM

## 2021-06-05 ENCOUNTER — Ambulatory Visit
Admission: RE | Admit: 2021-06-05 | Discharge: 2021-06-05 | Disposition: A | Discharge status: Home / Self Care | Payer: Managed Care, Other (non HMO) | Source: Ambulatory Visit | Attending: Student | Admitting: Student

## 2021-06-05 DIAGNOSIS — K4090 Unilateral inguinal hernia, without obstruction or gangrene, not specified as recurrent: Secondary | ICD-10-CM

## 2021-06-06 ENCOUNTER — Other Ambulatory Visit: Payer: Self-pay

## 2021-06-06 ENCOUNTER — Ambulatory Visit: Payer: Self-pay | Admitting: General Surgery

## 2021-06-06 ENCOUNTER — Emergency Department (HOSPITAL_BASED_OUTPATIENT_CLINIC_OR_DEPARTMENT_OTHER)
Admission: EM | Admit: 2021-06-06 | Discharge: 2021-06-06 | Disposition: A | Discharge status: Home / Self Care | Payer: Managed Care, Other (non HMO) | Attending: Emergency Medicine | Admitting: Emergency Medicine

## 2021-06-06 ENCOUNTER — Encounter (HOSPITAL_BASED_OUTPATIENT_CLINIC_OR_DEPARTMENT_OTHER): Payer: Self-pay | Admitting: Emergency Medicine

## 2021-06-06 DIAGNOSIS — R0789 Other chest pain: Secondary | ICD-10-CM | POA: Diagnosis not present

## 2021-06-06 DIAGNOSIS — G20 Parkinson's disease: Secondary | ICD-10-CM | POA: Insufficient documentation

## 2021-06-06 DIAGNOSIS — R002 Palpitations: Secondary | ICD-10-CM | POA: Insufficient documentation

## 2021-06-06 DIAGNOSIS — Z87442 Personal history of urinary calculi: Secondary | ICD-10-CM | POA: Diagnosis not present

## 2021-06-06 LAB — CBC WITH DIFFERENTIAL/PLATELET
Abs Immature Granulocytes: 0.01 10*3/uL (ref 0.00–0.07)
Basophils Absolute: 0 10*3/uL (ref 0.0–0.1)
Basophils Relative: 0 %
Eosinophils Absolute: 0.1 10*3/uL (ref 0.0–0.5)
Eosinophils Relative: 2 %
HCT: 42.7 % (ref 39.0–52.0)
Hemoglobin: 14.2 g/dL (ref 13.0–17.0)
Immature Granulocytes: 0 %
Lymphocytes Relative: 27 %
Lymphs Abs: 1.7 10*3/uL (ref 0.7–4.0)
MCH: 27 pg (ref 26.0–34.0)
MCHC: 33.3 g/dL (ref 30.0–36.0)
MCV: 81.3 fL (ref 80.0–100.0)
Monocytes Absolute: 0.8 10*3/uL (ref 0.1–1.0)
Monocytes Relative: 13 %
Neutro Abs: 3.6 10*3/uL (ref 1.7–7.7)
Neutrophils Relative %: 58 %
Platelets: 210 10*3/uL (ref 150–400)
RBC: 5.25 MIL/uL (ref 4.22–5.81)
RDW: 13.3 % (ref 11.5–15.5)
WBC: 6.2 10*3/uL (ref 4.0–10.5)
nRBC: 0 % (ref 0.0–0.2)

## 2021-06-06 LAB — BASIC METABOLIC PANEL
Anion gap: 10 (ref 5–15)
BUN: 18 mg/dL (ref 8–23)
CO2: 26 mmol/L (ref 22–32)
Calcium: 9.1 mg/dL (ref 8.9–10.3)
Chloride: 103 mmol/L (ref 98–111)
Creatinine, Ser: 0.75 mg/dL (ref 0.61–1.24)
GFR, Estimated: >60 mL/min (ref >60)
Glucose, Bld: 95 mg/dL (ref 70–99)
Potassium: 3.7 mmol/L (ref 3.5–5.1)
Sodium: 139 mmol/L (ref 135–145)

## 2021-06-06 LAB — TROPONIN I (HIGH SENSITIVITY)
Troponin I (High Sensitivity): 2 ng/L (ref <18)
Troponin I (High Sensitivity): <2 ng/L (ref <18)

## 2021-06-06 MED ORDER — LORAZEPAM 2 MG/ML IJ SOLN
0.5000 mg | Freq: Once | INTRAMUSCULAR | Status: AC
Start: 2021-06-06 — End: 2021-06-06
  Administered 2021-06-06: 0.5 mg via INTRAVENOUS
  Filled 2021-06-06: qty 1

## 2021-06-06 MED ORDER — ASPIRIN 81 MG PO CHEW
324.0000 mg | CHEWABLE_TABLET | Freq: Once | ORAL | Status: AC
Start: 2021-06-06 — End: 2021-06-06
  Administered 2021-06-06: 324 mg via ORAL
  Filled 2021-06-06: qty 4

## 2021-06-06 NOTE — Discharge Instructions (Signed)
Follow-up with your primary care doctor.  Come back to ER if you have further episodes of palpitations, chest pain or other new concerning symptom. ?

## 2021-06-06 NOTE — H&P (View-Only) (Signed)
?Chief Complaint: Follow-up and Hernia ?  ?  ?  ?History of Present Illness: ?[name] is a 66 y.o. male who is seen today as an office consultation at the request of Dr. Rex Kras for evaluation of Follow-up and Hernia ?Marland Kitchen   ?Patient is a 66 year old male with history of Parkinson who comes in secondary to a recurrent right inguinal hernia. ?Patient underwent open right inguinal hernia pair with mesh by Dr. Grandville Silos at the end of last year.  He states that soon thereafter he noticed a bulge in the right inguinal area that is gotten larger.  Patient was recently seen in urgent clinic and was thought to have a recurrent hernia.  Patient went CT scan which was significant for a large right inguinal hernia containing bowel.  Patient follows up today secondary to scheduling for surgery. ?  ?Patient states that the hernia is not painful.  He is concerned as this is causing some inflammation, swelling down to the right inguinal area causing issues with urination. ?  ?Patient's had a previous lower midline incision secondary to kidney stone removal in the remote past. ?  ?  ?Review of Systems: ?A complete review of systems was obtained from the patient.  I have reviewed this information and discussed as appropriate with the patient.  See HPI as well for other ROS. ?  ?Review of Systems  ?Constitutional: Negative for fever.  ?HENT: Negative for congestion.   ?Eyes: Negative for blurred vision.  ?Respiratory: Negative for cough, shortness of breath and wheezing.   ?Cardiovascular: Negative for chest pain and palpitations.  ?Gastrointestinal: Negative for heartburn.  ?Genitourinary: Negative for dysuria.  ?Musculoskeletal: Negative for myalgias.  ?Skin: Negative for rash.  ?Neurological: Negative for dizziness and headaches.  ?Psychiatric/Behavioral: Negative for depression and suicidal ideas.  ?All other systems reviewed and are negative. ?  ?  ?  ?Medical History: ?Past Medical History ?Past Medical  History: ?Diagnosis Date ? Sleep apnea   ? ?  ?  ?Patient Active Problem List ?Diagnosis ? Body mass index (BMI) 36.0-36.9, adult ? Calculus of kidney ? Colon cancer screening ? Epigastric pain ? Hammer toe ? Hydroureter ? Induratio penis plastica ? Lesion of ulnar nerve ? Major depression, single episode ? Morbid obesity (CMS-HCC) ? Obstructive sleep apnea (adult) (pediatric) ? Parkinson's disease (CMS-HCC) ? Personal history of urinary calculi ? Pure hypercholesterolemia ? S/P deep brain stimulator placement ? Seborrhea ? Temporomandibular joint disorder ?  ?  ?Past Surgical History ?Past Surgical History: ?Procedure Laterality Date ? dbs parkinsons     ? foot surgery      ? ureter restriction surgery  N/A   ? ?  ?  ?Allergies ?Allergies ?Allergen Reactions ? Ropinirole Hcl Nausea ? ?  ?  ?Current Outpatient Medications on File Prior to Visit ?Medication Sig Dispense Refill ? carbidopa-levodopa (SINEMET) 25-100 mg tablet carbidopa 25 mg-levodopa 100 mg tablet ? TAKE 1.5 TABLETS BY MOUTH 4 (FOUR) TIMES DAILY. AT 8, 12, 4 PM AND 8 PM     ? HYDROcodone-acetaminophen (NORCO) 5-325 mg tablet Take by mouth     ? rasagiline (AZILECT) 1 mg tablet rasagiline 1 mg tablet ? TAKE 1 TABLET BY MOUTH EVERY DAY     ? rotigotine (NEUPRO) 4 mg/24 hour Neupro 4 mg/24 hour transdermal 24 hour patch ? APPLY 1 PATCH ONTO THE SKIN EVERY DAY     ?  ?No current facility-administered medications on file prior to visit. ?  ?  ?Family History ?Family History ?  Problem Relation Age of Onset ? High blood pressure (Hypertension) Mother   ? Diabetes Father   ? ?  ?  ?Social History ?  ?Tobacco Use ?Smoking Status Never ?Smokeless Tobacco Never ?  ?  ?Social History ?Social History ?  ? ?Socioeconomic History ? Marital status: Married ?Tobacco Use ? Smoking status: Never ? Smokeless tobacco: Never ?Substance and Sexual Activity ? Alcohol use: Not Currently ? Drug use: Never ? ?  ?  ?Objective: ?  ?  ?There were no vitals filed for this visit.   ?There is no height or weight on file to calculate BMI. ?Physical Exam ?Constitutional:   ?   Appearance: Normal appearance.  ?HENT:  ?   Head: Normocephalic and atraumatic.  ?   Nose: Nose normal. No congestion.  ?   Mouth/Throat:  ?   Mouth: Mucous membranes are moist.  ?   Pharynx: Oropharynx is clear.  ?Eyes:  ?   Pupils: Pupils are equal, round, and reactive to light.  ?Cardiovascular:  ?   Rate and Rhythm: Normal rate and regular rhythm.  ?   Pulses: Normal pulses.  ?   Heart sounds: Normal heart sounds. No murmur heard. ?  No friction rub. No gallop.  ?Pulmonary:  ?   Effort: Pulmonary effort is normal. No respiratory distress.  ?   Breath sounds: Normal breath sounds. No stridor. No wheezing, rhonchi or rales.  ?Abdominal:  ?   General: Abdomen is flat.  ?   Hernia: A hernia is present. Hernia is present in the right inguinal area.  ?Musculoskeletal:     ?   General: Normal range of motion.  ?   Cervical back: Normal range of motion.  ?Skin: ?   General: Skin is warm and dry.  ?Neurological:  ?   General: No focal deficit present.  ?   Mental Status: He is alert and oriented to person, place, and time.  ?Psychiatric:     ?   Mood and Affect: Mood normal.     ?   Thought Content: Thought content normal.  ?  ?  ?  ?  ?Assessment and Plan: ?Diagnoses and all orders for this visit: ?  ?Recurrent right inguinal hernia ?  ?  ?[name] is a 66 y.o. male  ?  ?1.  We will proceed to the OR for a robotic right inguinal hernia repair with mesh, possible open. ?2. All risks and benefits were discussed with the patient, to generally include infection, bleeding, damage to surrounding structures, acute and chronic nerve pain, and recurrence. Alternatives were offered and described.  All questions were answered and the patient voiced understanding of the procedure and wishes to proceed at this point. ?  ?  ?  ?  ?  ?  ?No follow-ups on file. ?  ?Ralene Ok, MD, FACS ?[facility], [place] ?General &  Minimally Invasive Surgery ? ?

## 2021-06-06 NOTE — H&P (Signed)
?Chief Complaint: Follow-up and Hernia ?  ?  ?  ?History of Present Illness: ?[name] is a 66 y.o. male who is seen today as an office consultation at the request of Dr. Little for evaluation of Follow-up and Hernia ?.   ?Patient is a 66-year-old male with history of Parkinson who comes in secondary to a recurrent right inguinal hernia. ?Patient underwent open right inguinal hernia pair with mesh by Dr. Thompson at the end of last year.  He states that soon thereafter he noticed a bulge in the right inguinal area that is gotten larger.  Patient was recently seen in urgent clinic and was thought to have a recurrent hernia.  Patient went CT scan which was significant for a large right inguinal hernia containing bowel.  Patient follows up today secondary to scheduling for surgery. ?  ?Patient states that the hernia is not painful.  He is concerned as this is causing some inflammation, swelling down to the right inguinal area causing issues with urination. ?  ?Patient's had a previous lower midline incision secondary to kidney stone removal in the remote past. ?  ?  ?Review of Systems: ?A complete review of systems was obtained from the patient.  I have reviewed this information and discussed as appropriate with the patient.  See HPI as well for other ROS. ?  ?Review of Systems  ?Constitutional: Negative for fever.  ?HENT: Negative for congestion.   ?Eyes: Negative for blurred vision.  ?Respiratory: Negative for cough, shortness of breath and wheezing.   ?Cardiovascular: Negative for chest pain and palpitations.  ?Gastrointestinal: Negative for heartburn.  ?Genitourinary: Negative for dysuria.  ?Musculoskeletal: Negative for myalgias.  ?Skin: Negative for rash.  ?Neurological: Negative for dizziness and headaches.  ?Psychiatric/Behavioral: Negative for depression and suicidal ideas.  ?All other systems reviewed and are negative. ?  ?  ?  ?Medical History: ?Past Medical History ?Past Medical  History: ?Diagnosis Date ? Sleep apnea   ? ?  ?  ?Patient Active Problem List ?Diagnosis ? Body mass index (BMI) 36.0-36.9, adult ? Calculus of kidney ? Colon cancer screening ? Epigastric pain ? Hammer toe ? Hydroureter ? Induratio penis plastica ? Lesion of ulnar nerve ? Major depression, single episode ? Morbid obesity (CMS-HCC) ? Obstructive sleep apnea (adult) (pediatric) ? Parkinson's disease (CMS-HCC) ? Personal history of urinary calculi ? Pure hypercholesterolemia ? S/P deep brain stimulator placement ? Seborrhea ? Temporomandibular joint disorder ?  ?  ?Past Surgical History ?Past Surgical History: ?Procedure Laterality Date ? dbs parkinsons     ? foot surgery      ? ureter restriction surgery  N/A   ? ?  ?  ?Allergies ?Allergies ?Allergen Reactions ? Ropinirole Hcl Nausea ? ?  ?  ?Current Outpatient Medications on File Prior to Visit ?Medication Sig Dispense Refill ? carbidopa-levodopa (SINEMET) 25-100 mg tablet carbidopa 25 mg-levodopa 100 mg tablet ? TAKE 1.5 TABLETS BY MOUTH 4 (FOUR) TIMES DAILY. AT 8, 12, 4 PM AND 8 PM     ? HYDROcodone-acetaminophen (NORCO) 5-325 mg tablet Take by mouth     ? rasagiline (AZILECT) 1 mg tablet rasagiline 1 mg tablet ? TAKE 1 TABLET BY MOUTH EVERY DAY     ? rotigotine (NEUPRO) 4 mg/24 hour Neupro 4 mg/24 hour transdermal 24 hour patch ? APPLY 1 PATCH ONTO THE SKIN EVERY DAY     ?  ?No current facility-administered medications on file prior to visit. ?  ?  ?Family History ?Family History ?  Problem Relation Age of Onset ? High blood pressure (Hypertension) Mother   ? Diabetes Father   ? ?  ?  ?Social History ?  ?Tobacco Use ?Smoking Status Never ?Smokeless Tobacco Never ?  ?  ?Social History ?Social History ?  ? ?Socioeconomic History ? Marital status: Married ?Tobacco Use ? Smoking status: Never ? Smokeless tobacco: Never ?Substance and Sexual Activity ? Alcohol use: Not Currently ? Drug use: Never ? ?  ?  ?Objective: ?  ?  ?There were no vitals filed for this visit.   ?There is no height or weight on file to calculate BMI. ?Physical Exam ?Constitutional:   ?   Appearance: Normal appearance.  ?HENT:  ?   Head: Normocephalic and atraumatic.  ?   Nose: Nose normal. No congestion.  ?   Mouth/Throat:  ?   Mouth: Mucous membranes are moist.  ?   Pharynx: Oropharynx is clear.  ?Eyes:  ?   Pupils: Pupils are equal, round, and reactive to light.  ?Cardiovascular:  ?   Rate and Rhythm: Normal rate and regular rhythm.  ?   Pulses: Normal pulses.  ?   Heart sounds: Normal heart sounds. No murmur heard. ?  No friction rub. No gallop.  ?Pulmonary:  ?   Effort: Pulmonary effort is normal. No respiratory distress.  ?   Breath sounds: Normal breath sounds. No stridor. No wheezing, rhonchi or rales.  ?Abdominal:  ?   General: Abdomen is flat.  ?   Hernia: A hernia is present. Hernia is present in the right inguinal area.  ?Musculoskeletal:     ?   General: Normal range of motion.  ?   Cervical back: Normal range of motion.  ?Skin: ?   General: Skin is warm and dry.  ?Neurological:  ?   General: No focal deficit present.  ?   Mental Status: He is alert and oriented to person, place, and time.  ?Psychiatric:     ?   Mood and Affect: Mood normal.     ?   Thought Content: Thought content normal.  ?  ?  ?  ?  ?Assessment and Plan: ?Diagnoses and all orders for this visit: ?  ?Recurrent right inguinal hernia ?  ?  ?[name] is a 66 y.o. male  ?  ?1.  We will proceed to the OR for a robotic right inguinal hernia repair with mesh, possible open. ?2. All risks and benefits were discussed with the patient, to generally include infection, bleeding, damage to surrounding structures, acute and chronic nerve pain, and recurrence. Alternatives were offered and described.  All questions were answered and the patient voiced understanding of the procedure and wishes to proceed at this point. ?  ?  ?  ?  ?  ?  ?No follow-ups on file. ?  ? , MD, FACS ?[facility] ?General &  Minimally Invasive Surgery ? ?

## 2021-06-06 NOTE — ED Triage Notes (Signed)
Pt c/o feeling like his heart is racing since 1900 last night.  ?

## 2021-06-06 NOTE — ED Provider Notes (Signed)
? ?[facility] ?Provider Note: Lowella DellJ. Lane , MD, FACEP ? ?CSN: [id number] ?MRN: [id number] ?ARRIVAL: 06/06/21 at 0550 ?ROOM: DB012/DB012 ? ? ?CHIEF COMPLAINT  ?Palpitations ? ? ?HISTORY OF PRESENT ILLNESS  ?06/06/21 6:07 AM ?[name] is a 66 y.o. male with about 10 hours of the sensation that his heart is beating rapidly.  It is accompanied by a sensation of a tightness in his chest but he denies any frank pain.  Nothing makes the symptoms better or worse.  He was able to sleep through most of the night with the symptoms.  He has no associated shortness of breath, nausea or diaphoresis.  He describes the symptoms as being fairly constant.  On arrival his pulse rate is noted to be 83.  He does admit to being anxious with the symptoms. ? ? ?Past Medical History:  ?Diagnosis Date  ? History of kidney stones 04/14/2019  ? surgery to remove  ? Lesion of ulnar nerve   ? no current problems  ? Parkinson's disease (HCC)   ? Sleep apnea   ? occasional uses cpap - setting at 7  ? ? ?Past Surgical History:  ?Procedure Laterality Date  ? COLONOSCOPY  2021  ? CYSTOSCOPY WITH RETROGRADE PYELOGRAM, URETEROSCOPY AND STENT PLACEMENT Right 04/14/2019  ? Procedure: CYSTOSCOPY WITH RETROGRADE PYELOGRAM, URETEROSCOPY AND STENT PLACEMENT;  Surgeon: Crista ElliotBell, Eugene D III, MD;  Location: [facility];  Service: Urology;  Laterality: Right;  ? DBS surgery   03/2017  ? x 2 surg for phase 1 and phase 2 - brain stimulator for parkinson's disease  ? HAMMERTOE RECONSTRUCTION WITH WEIL OSTEOTOMY Right 05/25/2015  ? Procedure: RIGHT SECOND TOE METATARSAL WEIL OSTEOTOMY  HAMMERTOE CORRECTION COLLATERAL LIGAMENT REPAIR; FLEXOR TO EXTENSOR TRANSFER ;  Surgeon: Toni ArthursJohn Hewitt, MD;  Location: [facility];  Service: Orthopedics;  Laterality: Right;  ? INGUINAL HERNIA REPAIR Right 12/21/2020  ? Procedure: REPAIR RIGHT INGUINAL HERNIA REPAIR WITH MESH;  Surgeon: Violeta Gelinashompson, Burke, MD;  [facility];  Service: General;  Laterality: Right;   ? INSERTION OF MESH Right 12/21/2020  ? Procedure: INSERTION OF MESH;  Surgeon: Violeta Gelinashompson, Burke, MD;  [facility];  Service: General;  Laterality: Right;  ? kidney stones  1991  ? surgery to remove  ? ? ?Family History  ?Problem Relation Age of Onset  ? Sleep apnea Mother   ? Heart failure Father   ? Cancer Father   ? Diabetes Father   ? Sleep apnea Brother   ? Parkinson's disease Neg Hx   ? ? ?Social History  ? ?Tobacco Use  ? Smoking status: Never  ? Smokeless tobacco: Never  ?Vaping Use  ? Vaping Use: Never used  ?Substance Use Topics  ? Alcohol use: Not Currently  ?  Comment: occasional  ? Drug use: No  ? ? ?Prior to Admission medications   ?Medication Sig Start Date End Date Taking? Authorizing Provider  ?acetaminophen (TYLENOL) 500 MG tablet Take 1,000 mg by mouth every 6 (six) hours as needed for mild pain.    [provider]  ?calcium carbonate (TUMS EX) 750 MG chewable tablet Chew 750 mg by mouth daily as needed for heartburn.    [provider]  ?carbidopa-levodopa (SINEMET IR) 25-100 MG tablet Take 1.5 tablets by mouth 4 (four) times daily. At 8, 12, 4 PM and 8 PM ?Patient taking differently: Take 1.5 tablets by mouth 5 (five) times daily. 08/23/19   Huston FoleyAthar, Saima, MD  ?rasagiline (AZILECT) 1 MG TABS  tablet Take 1 tablet (1 mg total) by mouth daily. 10/12/20   Huston Foley, MD  ?rotigotine (NEUPRO) 4 MG/24HR Place 1 patch onto the skin daily. 11/08/20   Huston Foley, MD  ? ? ?Allergies ?Ropinirole hcl ? ? ?REVIEW OF SYSTEMS  ?Negative except as noted here or in the History of Present Illness. ? ? ?PHYSICAL EXAMINATION  ?Initial Vital Signs ?Blood pressure 127/80, pulse 83, resp. rate 16, height 6\' 3"  (1.905 m), weight 120 kg, SpO2 95 %. ? ?Examination ?General: Well-developed, well-nourished male in no acute distress; appearance consistent with age of record ?HENT: normocephalic; atraumatic ?Eyes: pupils equal, round and reactive to light; extraocular muscles intact ?Neck: supple ?Heart:  regular rate and rhythm ?Lungs: clear to auscultation bilaterally ?Abdomen: soft; nondistended; nontender; bowel sounds present ?Extremities: No deformity; full range of motion; pulses normal ?Neurologic: Awake, alert and oriented; motor function intact in all extremities and symmetric; no facial droop ?Skin: Warm and dry ?Psychiatric: Mildly anxious ? ? ?RESULTS  ?Summary of this visit's results, reviewed and interpreted by myself: ? ? EKG Interpretation ? ?Date/Time:  Wednesday June 06 2021 06:05:16 EDT ?Ventricular Rate:  70 ?PR Interval:  127 ?QRS Duration: 261 ?QT Interval:  440 ?QTC Calculation: 475 ?R Axis:     ?Text Interpretation: Normal sinus rhythm No previous ECGs available Confirmed by , 05-08-1992 (Jonny Ruiz) on 06/06/2021 6:06:57 AM ?  ? ?  ? ?Laboratory Studies: ?Results for orders placed or performed during the hospital encounter of 06/06/21 (from the past 24 hour(s))  ?CBC with Differential     Status: None  ? Collection Time: 06/06/21  6:08 AM  ?Result Value Ref Range  ? WBC 6.2 4.0 - 10.5 K/uL  ? RBC 5.25 4.22 - 5.81 MIL/uL  ? Hemoglobin 14.2 13.0 - 17.0 g/dL  ? HCT 42.7 39.0 - 52.0 %  ? MCV 81.3 80.0 - 100.0 fL  ? MCH 27.0 26.0 - 34.0 pg  ? MCHC 33.3 30.0 - 36.0 g/dL  ? RDW 13.3 11.5 - 15.5 %  ? Platelets 210 150 - 400 K/uL  ? nRBC 0.0 0.0 - 0.2 %  ? Neutrophils Relative % 58 %  ? Neutro Abs 3.6 1.7 - 7.7 K/uL  ? Lymphocytes Relative 27 %  ? Lymphs Abs 1.7 0.7 - 4.0 K/uL  ? Monocytes Relative 13 %  ? Monocytes Absolute 0.8 0.1 - 1.0 K/uL  ? Eosinophils Relative 2 %  ? Eosinophils Absolute 0.1 0.0 - 0.5 K/uL  ? Basophils Relative 0 %  ? Basophils Absolute 0.0 0.0 - 0.1 K/uL  ? Immature Granulocytes 0 %  ? Abs Immature Granulocytes 0.01 0.00 - 0.07 K/uL  ?Basic metabolic panel     Status: None  ? Collection Time: 06/06/21  6:08 AM  ?Result Value Ref Range  ? Sodium 139 135 - 145 mmol/L  ? Potassium 3.7 3.5 - 5.1 mmol/L  ? Chloride 103 98 - 111 mmol/L  ? CO2 26 22 - 32 mmol/L  ? Glucose, Bld 95 70 - 99  mg/dL  ? BUN 18 8 - 23 mg/dL  ? Creatinine, Ser 0.75 0.61 - 1.24 mg/dL  ? Calcium 9.1 8.9 - 10.3 mg/dL  ? GFR, Estimated >60 >60 mL/min  ? Anion gap 10 5 - 15  ?Troponin I (High Sensitivity)     Status: None  ? Collection Time: 06/06/21  6:08 AM  ?Result Value Ref Range  ? Troponin I (High Sensitivity) 2 <18 ng/L  ? ?Imaging Studies: ?CT ABDOMEN  PELVIS WO CONTRAST ? ?Result Date: 06/05/2021 ?CLINICAL DATA:  Evaluate right inguinal hernia EXAM: CT ABDOMEN AND PELVIS WITHOUT CONTRAST TECHNIQUE: Multidetector CT imaging of the abdomen and pelvis was performed following the standard protocol without IV contrast. RADIATION DOSE REDUCTION: This exam was performed according to the departmental dose-optimization program which includes automated exposure control, adjustment of the mA and/or kV according to patient size and/or use of iterative reconstruction technique. COMPARISON:  12/11/2020 FINDINGS: Lower chest: No acute abnormality. Hepatobiliary: No solid liver abnormality is seen. No gallstones, gallbladder wall thickening, or biliary dilatation. Pancreas: Unremarkable. No pancreatic ductal dilatation or surrounding inflammatory changes. Spleen: Normal in size without significant abnormality. Adrenals/Urinary Tract: Adrenal glands are unremarkable. No significant change in moderate right hydronephrosis and hydroureter, with a patulous distal right ureter. No obstructing calculus or other lesion is again identified to the ureterovesicular junction. No left hydronephrosis. Bladder is unremarkable. Stomach/Bowel: Stomach is within normal limits. Appendix not clearly and may be surgically absent. No evidence of bowel wall thickening, distention, or inflammatory changes. Vascular/Lymphatic: Aortic atherosclerosis. No enlarged abdominal or pelvic lymph nodes. Reproductive: No mass or other significant abnormality. Other: Large right inguinal hernia containing multiple loops of nonobstructed distal ileum (series 2, image 90).  No ascites. Musculoskeletal: No acute or significant osseous findings. IMPRESSION: 1. Large right inguinal hernia containing multiple loops of nonobstructed distal ileum. 2. No significant change in m

## 2021-06-08 NOTE — ED Provider Notes (Signed)
66 year old gentleman presenting to ER due to concern for palpitations.  EKG without acute ischemic change, initial troponin normal, initial labs stable.  At time of signout, plan to discharge patient if repeat troponin negative and patient remains well-appearing. ? ?Repeat troponin is undetectable.  Patient has no ongoing symptoms.  His vitals are all stable.  Discharged home, advised follow-up with PCP. ?  ?Milagros Loll, MD ?06/08/21 [phone] ? ?

## 2021-06-12 NOTE — Progress Notes (Signed)
Surgical Instructions ? ? ? Your procedure is scheduled on Tuesday 06/19/21. ? Report to [facility] Main Entrance "A" at 11:30 A.M., then check in with the Admitting office. ? Call this number if you have problems the morning of surgery: ? [phone] ? ? If you have any questions prior to your surgery date call [phone]: Open Monday-Friday 8am-4pm ? ? ? Remember: ? Do not eat after midnight the night before your surgery ? ?You may drink clear liquids until 10:30am the morning of your surgery.   ?Clear liquids allowed are: Water, Non-Citrus Juices (without pulp), Carbonated Beverages, Clear Tea, Black Coffee ONLY (NO MILK, CREAM OR POWDERED CREAMER of any kind), and Gatorade ? ?Patient Instructions ? ?The night before surgery:  ?No food after midnight. ONLY clear liquids after midnight ? ?The day of surgery (if you do NOT have diabetes):  ?Drink ONE (1) Pre-Surgery Clear Ensure by 10:30am the morning of surgery. Drink in one sitting. Do not sip.  ?This drink was given to you during your hospital  ?pre-op appointment visit. ?Nothing else to drink after completing the  ?Pre-Surgery Clear Ensure. ? ? ? ?       If you have questions, please contact your surgeon?s office. ? ?  ? Take these medicines the morning of surgery with A SIP OF WATER:  ?carbidopa-levodopa (SINEMET IR) ?rasagiline (AZILECT)  ? ?IF NEEDED: ?acetaminophen (TYLENOL)  ? ?As of today, STOP taking any Aspirin (unless otherwise instructed by your surgeon) Aleve, Naproxen, Ibuprofen, Motrin, Advil, Goody's, BC's, all herbal medications, fish oil, and all vitamins. ? ?         ?Do not wear jewelry or makeup ?Do not wear lotions, powders, perfumes/colognes, or deodorant. ?Do not shave 48 hours prior to surgery.   ?Do not bring valuables to the hospital. ?Do not wear nail polish, gel polish, artificial nails, or any other type of covering on natural nails (fingers and toes) ?If you have artificial nails or gel coating that need to be removed by a nail  salon, please have this removed prior to surgery. Artificial nails or gel coating may interfere with anesthesia's ability to adequately monitor your vital signs. ? ?[facility] is not responsible for any belongings or valuables. .  ? ?Do NOT Smoke (Tobacco/Vaping)  24 hours prior to your procedure ? ?If you use a CPAP at night, you may bring your mask for your overnight stay. ?  ?Contacts, glasses, hearing aids, dentures or partials may not be worn into surgery, please bring cases for these belongings ?  ?For patients admitted to the hospital, discharge time will be determined by your treatment team. ?  ?Patients discharged the day of surgery will not be allowed to drive home, and someone needs to stay with them for 24 hours. ? ? ?SURGICAL WAITING ROOM VISITATION ?Patients having surgery or a procedure in a hospital may have two support people. ?Children under the age of 8 must have an adult with them who is not the patient. ?They may stay in the waiting area during the procedure and may switch out with other visitors. If the patient needs to stay at the hospital during part of their recovery, the visitor guidelines for inpatient rooms apply. ? ?Please refer to the  website for the visitor guidelines for Inpatients (after your surgery is over and you are in a regular room).  ? ? ? ? ? ?Special instructions:   ? ?Oral Hygiene is also important to reduce your risk of infection.  Remember - BRUSH YOUR TEETH THE MORNING OF SURGERY WITH YOUR REGULAR TOOTHPASTE ? ? ?[name]- Preparing For Surgery ? ?Before surgery, you can play an important role. Because skin is not sterile, your skin needs to be as free of germs as possible. You can reduce the number of germs on your skin by washing with CHG (chlorahexidine gluconate) Soap before surgery.  CHG is an antiseptic cleaner which kills germs and bonds with the skin to continue killing germs even after washing.   ? ? ?Please do not use if you have an allergy to  CHG or antibacterial soaps. If your skin becomes reddened/irritated stop using the CHG.  ?Do not shave (including legs and underarms) for at least 48 hours prior to first CHG shower. It is OK to shave your face. ? ?Please follow these instructions carefully. ?  ? ? Shower the NIGHT BEFORE SURGERY and the MORNING OF SURGERY with CHG Soap.  ? If you chose to wash your hair, wash your hair first as usual with your normal shampoo. After you shampoo, rinse your hair and body thoroughly to remove the shampoo.  Then [employer] and genitals (private parts) with your normal soap and rinse thoroughly to remove soap. ? ?After that Use CHG Soap as you would any other liquid soap. You can apply CHG directly to the skin and wash gently with a scrungie or a clean washcloth.  ? ?Apply the CHG Soap to your body ONLY FROM THE NECK DOWN.  Do not use on open wounds or open sores. Avoid contact with your eyes, ears, mouth and genitals (private parts). Wash Face and genitals (private parts)  with your normal soap.  ? ?Wash thoroughly, paying special attention to the area where your surgery will be performed. ? ?Thoroughly rinse your body with warm water from the neck down. ? ?DO NOT shower/wash with your normal soap after using and rinsing off the CHG Soap. ? ?Pat yourself dry with a CLEAN TOWEL. ? ?Wear CLEAN PAJAMAS to bed the night before surgery ? ?Place CLEAN SHEETS on your bed the night before your surgery ? ?DO NOT SLEEP WITH PETS. ? ? ?Day of Surgery: ?Take a shower with CHG soap. ?Wear Clean/Comfortable clothing the morning of surgery ?Do not apply any deodorants/lotions.   ?Remember to brush your teeth WITH YOUR REGULAR TOOTHPASTE. ? ? ? ?If you received a COVID test during your pre-op visit, it is requested that you wear a mask when out in public, stay away from anyone that may not be feeling well, and notify your surgeon if you develop symptoms. If you have been in contact with anyone that has tested positive in the last 10  days, please notify your surgeon. ? ?  ?Please read over the following fact sheets that you were given.  ? ?

## 2021-06-13 ENCOUNTER — Other Ambulatory Visit: Payer: Self-pay

## 2021-06-13 ENCOUNTER — Encounter (HOSPITAL_COMMUNITY): Payer: Self-pay

## 2021-06-13 ENCOUNTER — Encounter (HOSPITAL_COMMUNITY)
Admission: RE | Admit: 2021-06-13 | Discharge: 2021-06-13 | Disposition: A | Discharge status: Home / Self Care | Payer: Managed Care, Other (non HMO) | Source: Ambulatory Visit | Attending: General Surgery | Admitting: General Surgery

## 2021-06-13 DIAGNOSIS — Z01818 Encounter for other preprocedural examination: Secondary | ICD-10-CM | POA: Diagnosis not present

## 2021-06-13 NOTE — Progress Notes (Signed)
PCP:  Jillyn Ledger, FNP ?Cardiologist:  denies ? ?EKG:  06/06/21 ?CXR:  na ?ECHO:  denies ?Stress Test:  denies ?Cardiac Cath:  denies ? ?Fasting Blood Sugar-  na ?Checks Blood Sugar__na_ times a day ? ?OSA: Yes ?CPAP: No ? ?ASA/Blood Thinner: No ? ?Covid test not needed ? ?Anesthesia Review: No ? ?Patient denies shortness of breath, fever, cough, and chest pain at PAT appointment. ? ?Patient verbalized understanding of instructions provided today at the PAT appointment.  Patient asked to review instructions at home and day of surgery.   ?

## 2021-06-19 ENCOUNTER — Other Ambulatory Visit: Payer: Self-pay

## 2021-06-19 ENCOUNTER — Ambulatory Visit (HOSPITAL_BASED_OUTPATIENT_CLINIC_OR_DEPARTMENT_OTHER): Payer: Managed Care, Other (non HMO) | Admitting: Certified Registered Nurse Anesthetist

## 2021-06-19 ENCOUNTER — Encounter (HOSPITAL_COMMUNITY)
Admission: RE | Disposition: A | Discharge status: Home / Self Care | Payer: Self-pay | Source: Home / Self Care | Attending: General Surgery

## 2021-06-19 ENCOUNTER — Ambulatory Visit (HOSPITAL_COMMUNITY)
Admission: RE | Admit: 2021-06-19 | Discharge: 2021-06-19 | Disposition: A | Discharge status: Home / Self Care | Payer: Managed Care, Other (non HMO) | Attending: General Surgery | Admitting: General Surgery

## 2021-06-19 ENCOUNTER — Encounter (HOSPITAL_COMMUNITY): Payer: Self-pay | Admitting: General Surgery

## 2021-06-19 ENCOUNTER — Ambulatory Visit (HOSPITAL_COMMUNITY): Payer: Managed Care, Other (non HMO) | Admitting: Certified Registered Nurse Anesthetist

## 2021-06-19 DIAGNOSIS — Z87442 Personal history of urinary calculi: Secondary | ICD-10-CM | POA: Insufficient documentation

## 2021-06-19 DIAGNOSIS — E669 Obesity, unspecified: Secondary | ICD-10-CM | POA: Diagnosis not present

## 2021-06-19 DIAGNOSIS — G4730 Sleep apnea, unspecified: Secondary | ICD-10-CM | POA: Diagnosis not present

## 2021-06-19 DIAGNOSIS — Z6833 Body mass index (BMI) 33.0-33.9, adult: Secondary | ICD-10-CM | POA: Insufficient documentation

## 2021-06-19 DIAGNOSIS — K4031 Unilateral inguinal hernia, with obstruction, without gangrene, recurrent: Secondary | ICD-10-CM | POA: Insufficient documentation

## 2021-06-19 DIAGNOSIS — G20 Parkinson's disease: Secondary | ICD-10-CM | POA: Diagnosis not present

## 2021-06-19 HISTORY — PX: XI ROBOTIC ASSISTED INGUINAL HERNIA REPAIR WITH MESH: SHX6706

## 2021-06-19 HISTORY — PX: INSERTION OF MESH: SHX5868

## 2021-06-19 SURGERY — REPAIR, HERNIA, INGUINAL, ROBOT-ASSISTED, LAPAROSCOPIC, USING MESH
Anesthesia: General | Site: Inguinal | Laterality: Right

## 2021-06-19 MED ORDER — CELECOXIB 200 MG PO CAPS
ORAL_CAPSULE | ORAL | Status: AC
  Administered 2021-06-19: 200 mg via ORAL
  Filled 2021-06-19: qty 1

## 2021-06-19 MED ORDER — FENTANYL CITRATE (PF) 100 MCG/2ML IJ SOLN
INTRAMUSCULAR | Status: AC
  Filled 2021-06-19: qty 2

## 2021-06-19 MED ORDER — FENTANYL CITRATE (PF) 250 MCG/5ML IJ SOLN
INTRAMUSCULAR | Status: AC
  Filled 2021-06-19: qty 5

## 2021-06-19 MED ORDER — ONDANSETRON HCL 4 MG/2ML IJ SOLN: 4.0000 mg | Freq: Once | INTRAMUSCULAR | Status: DC | PRN

## 2021-06-19 MED ORDER — OXYCODONE HCL 5 MG/5ML PO SOLN: 5.0000 mg | Freq: Once | ORAL | Status: AC | PRN

## 2021-06-19 MED ORDER — SUGAMMADEX SODIUM 200 MG/2ML IV SOLN
INTRAVENOUS | Status: DC | PRN
  Administered 2021-06-19: 200 mg via INTRAVENOUS
  Administered 2021-06-19: 100 mg via INTRAVENOUS

## 2021-06-19 MED ORDER — MIDAZOLAM HCL 2 MG/2ML IJ SOLN
INTRAMUSCULAR | Status: DC | PRN
  Administered 2021-06-19: 1 mg via INTRAVENOUS

## 2021-06-19 MED ORDER — ONDANSETRON HCL 4 MG/2ML IJ SOLN
INTRAMUSCULAR | Status: AC
  Filled 2021-06-19: qty 2

## 2021-06-19 MED ORDER — ONDANSETRON HCL 4 MG/2ML IJ SOLN
INTRAMUSCULAR | Status: DC | PRN
  Administered 2021-06-19: 4 mg via INTRAVENOUS

## 2021-06-19 MED ORDER — FENTANYL CITRATE (PF) 250 MCG/5ML IJ SOLN
INTRAMUSCULAR | Status: DC | PRN
  Administered 2021-06-19 (×3): 50 ug via INTRAVENOUS

## 2021-06-19 MED ORDER — ACETAMINOPHEN 500 MG PO TABS: 1000.0000 mg | ORAL_TABLET | ORAL | Status: DC

## 2021-06-19 MED ORDER — LACTATED RINGERS IV SOLN: INTRAVENOUS | Status: DC

## 2021-06-19 MED ORDER — ENSURE PRE-SURGERY PO LIQD: 296.0000 mL | Freq: Once | ORAL | Status: DC

## 2021-06-19 MED ORDER — TRAMADOL HCL 50 MG PO TABS: 50.0000 mg | ORAL_TABLET | Freq: Four times a day (QID) | ORAL | 0 refills | Status: DC | PRN

## 2021-06-19 MED ORDER — PROPOFOL 10 MG/ML IV BOLUS
INTRAVENOUS | Status: DC | PRN
  Administered 2021-06-19: 160 mg via INTRAVENOUS

## 2021-06-19 MED ORDER — BUPIVACAINE LIPOSOME 1.3 % IJ SUSP
INTRAMUSCULAR | Status: AC
  Filled 2021-06-19: qty 20

## 2021-06-19 MED ORDER — DEXAMETHASONE SODIUM PHOSPHATE 10 MG/ML IJ SOLN
INTRAMUSCULAR | Status: DC | PRN
  Administered 2021-06-19: 10 mg via INTRAVENOUS

## 2021-06-19 MED ORDER — EPHEDRINE 5 MG/ML INJ
INTRAVENOUS | Status: AC
  Filled 2021-06-19: qty 10

## 2021-06-19 MED ORDER — CHLORHEXIDINE GLUCONATE 0.12 % MT SOLN: 15.0000 mL | Freq: Once | OROMUCOSAL | Status: AC

## 2021-06-19 MED ORDER — BUPIVACAINE HCL (PF) 0.25 % IJ SOLN
INTRAMUSCULAR | Status: AC
  Filled 2021-06-19: qty 30

## 2021-06-19 MED ORDER — FENTANYL CITRATE (PF) 100 MCG/2ML IJ SOLN
25.0000 ug | INTRAMUSCULAR | Status: DC | PRN
  Administered 2021-06-19: 25 ug via INTRAVENOUS
  Administered 2021-06-19: 50 ug via INTRAVENOUS

## 2021-06-19 MED ORDER — LIDOCAINE 2% (20 MG/ML) 5 ML SYRINGE
INTRAMUSCULAR | Status: DC | PRN
  Administered 2021-06-19: 60 mg via INTRAVENOUS

## 2021-06-19 MED ORDER — ACETAMINOPHEN 500 MG PO TABS
ORAL_TABLET | ORAL | Status: AC
  Administered 2021-06-19: 1000 mg via ORAL
  Filled 2021-06-19: qty 2

## 2021-06-19 MED ORDER — DEXAMETHASONE SODIUM PHOSPHATE 10 MG/ML IJ SOLN
INTRAMUSCULAR | Status: AC
  Filled 2021-06-19: qty 1

## 2021-06-19 MED ORDER — CHLORHEXIDINE GLUCONATE CLOTH 2 % EX PADS
6.0000 | MEDICATED_PAD | Freq: Once | CUTANEOUS | Status: DC
Start: 2021-06-19 — End: 2021-06-19

## 2021-06-19 MED ORDER — CHLORHEXIDINE GLUCONATE 0.12 % MT SOLN
OROMUCOSAL | Status: AC
  Administered 2021-06-19: 15 mL via OROMUCOSAL
  Filled 2021-06-19: qty 15

## 2021-06-19 MED ORDER — ORAL CARE MOUTH RINSE: 15.0000 mL | Freq: Once | OROMUCOSAL | Status: AC

## 2021-06-19 MED ORDER — MIDAZOLAM HCL 2 MG/2ML IJ SOLN
INTRAMUSCULAR | Status: AC
  Filled 2021-06-19: qty 2

## 2021-06-19 MED ORDER — 0.9 % SODIUM CHLORIDE (POUR BTL) OPTIME
TOPICAL | Status: DC | PRN
  Administered 2021-06-19: 1000 mL

## 2021-06-19 MED ORDER — PHENYLEPHRINE 80 MCG/ML (10ML) SYRINGE FOR IV PUSH (FOR BLOOD PRESSURE SUPPORT)
PREFILLED_SYRINGE | INTRAVENOUS | Status: AC
  Filled 2021-06-19: qty 20

## 2021-06-19 MED ORDER — ACETAMINOPHEN 500 MG PO TABS: 1000.0000 mg | ORAL_TABLET | Freq: Once | ORAL | Status: AC

## 2021-06-19 MED ORDER — SODIUM CHLORIDE (PF) 0.9 % IJ SOLN
INTRAMUSCULAR | Status: DC | PRN
  Administered 2021-06-19: 40 mL

## 2021-06-19 MED ORDER — CELECOXIB 200 MG PO CAPS: 200.0000 mg | ORAL_CAPSULE | Freq: Once | ORAL | Status: AC

## 2021-06-19 MED ORDER — OXYCODONE HCL 5 MG PO TABS
5.0000 mg | ORAL_TABLET | Freq: Once | ORAL | Status: AC | PRN
  Administered 2021-06-19: 5 mg via ORAL

## 2021-06-19 MED ORDER — CHLORHEXIDINE GLUCONATE CLOTH 2 % EX PADS: 6.0000 | MEDICATED_PAD | Freq: Once | CUTANEOUS | Status: DC

## 2021-06-19 MED ORDER — ROCURONIUM BROMIDE 10 MG/ML (PF) SYRINGE
PREFILLED_SYRINGE | INTRAVENOUS | Status: DC | PRN
  Administered 2021-06-19 (×2): 10 mg via INTRAVENOUS
  Administered 2021-06-19: 60 mg via INTRAVENOUS
  Administered 2021-06-19: 10 mg via INTRAVENOUS

## 2021-06-19 MED ORDER — PHENYLEPHRINE 80 MCG/ML (10ML) SYRINGE FOR IV PUSH (FOR BLOOD PRESSURE SUPPORT)
PREFILLED_SYRINGE | INTRAVENOUS | Status: DC | PRN
  Administered 2021-06-19: 120 ug via INTRAVENOUS
  Administered 2021-06-19: 80 ug via INTRAVENOUS
  Administered 2021-06-19: 120 ug via INTRAVENOUS

## 2021-06-19 MED ORDER — OXYCODONE HCL 5 MG PO TABS
ORAL_TABLET | ORAL | Status: AC
  Filled 2021-06-19: qty 1

## 2021-06-19 MED ORDER — CEFAZOLIN IN SODIUM CHLORIDE 3-0.9 GM/100ML-% IV SOLN
INTRAVENOUS | Status: AC
  Filled 2021-06-19: qty 100

## 2021-06-19 MED ORDER — CEFAZOLIN IN SODIUM CHLORIDE 3-0.9 GM/100ML-% IV SOLN
3.0000 g | INTRAVENOUS | Status: AC
  Administered 2021-06-19: 3 g via INTRAVENOUS

## 2021-06-19 MED ORDER — BUPIVACAINE HCL 0.25 % IJ SOLN
INTRAMUSCULAR | Status: DC | PRN
  Administered 2021-06-19: 10 mL

## 2021-06-19 SURGICAL SUPPLY — 54 items
BAG COUNTER SPONGE SURGICOUNT (BAG) IMPLANT
CHLORAPREP W/TINT 26 (MISCELLANEOUS) ×2 IMPLANT
COVER MAYO STAND STRL (DRAPES) ×2 IMPLANT
COVER SURGICAL LIGHT HANDLE (MISCELLANEOUS) ×2 IMPLANT
COVER TIP SHEARS 8 DVNC (MISCELLANEOUS) ×1 IMPLANT
COVER TIP SHEARS 8MM DA VINCI (MISCELLANEOUS) ×2
DECANTER SPIKE VIAL GLASS SM (MISCELLANEOUS) ×2 IMPLANT
DEFOGGER SCOPE WARMER CLEARIFY (MISCELLANEOUS) ×1 IMPLANT
DERMABOND ADVANCED (GAUZE/BANDAGES/DRESSINGS) ×1
DERMABOND ADVANCED .7 DNX12 (GAUZE/BANDAGES/DRESSINGS) ×1 IMPLANT
DEVICE TROCAR PUNCTURE CLOSURE (ENDOMECHANICALS) ×2 IMPLANT
DRAPE ARM DVNC X/XI (DISPOSABLE) ×4 IMPLANT
DRAPE COLUMN DVNC XI (DISPOSABLE) ×1 IMPLANT
DRAPE CV SPLIT W-CLR ANES SCRN (DRAPES) ×2 IMPLANT
DRAPE DA VINCI XI ARM (DISPOSABLE) ×8
DRAPE DA VINCI XI COLUMN (DISPOSABLE) ×2
DRAPE ORTHO SPLIT 77X108 STRL (DRAPES) ×2
DRAPE SURG ORHT 6 SPLT 77X108 (DRAPES) ×1 IMPLANT
ELECT REM PT RETURN 9FT ADLT (ELECTROSURGICAL) ×2
ELECTRODE REM PT RTRN 9FT ADLT (ELECTROSURGICAL) ×1 IMPLANT
GLOVE BIO SURGEON STRL SZ7.5 (GLOVE) ×3 IMPLANT
GOWN STRL REUS W/ TWL LRG LVL3 (GOWN DISPOSABLE) ×2 IMPLANT
GOWN STRL REUS W/ TWL XL LVL3 (GOWN DISPOSABLE) ×2 IMPLANT
GOWN STRL REUS W/TWL 2XL LVL3 (GOWN DISPOSABLE) ×2 IMPLANT
GOWN STRL REUS W/TWL LRG LVL3 (GOWN DISPOSABLE) ×4
GOWN STRL REUS W/TWL XL LVL3 (GOWN DISPOSABLE) ×4
KIT BASIN OR (CUSTOM PROCEDURE TRAY) ×2 IMPLANT
KIT TURNOVER KIT B (KITS) ×2 IMPLANT
MARKER SKIN DUAL TIP RULER LAB (MISCELLANEOUS) ×2 IMPLANT
MESH PROGRIP LAP SELF FIXATING (Mesh General) ×2 IMPLANT
MESH PROGRIP LAP SLF FIX 16X12 (Mesh General) ×1 IMPLANT
NDL INSUFFLATION 14GA 120MM (NEEDLE) ×1 IMPLANT
NEEDLE HYPO 22GX1.5 SAFETY (NEEDLE) ×2 IMPLANT
NEEDLE INSUFFLATION 14GA 120MM (NEEDLE) ×2 IMPLANT
OBTURATOR OPTICAL STANDARD 8MM (TROCAR)
OBTURATOR OPTICAL STND 8 DVNC (TROCAR)
OBTURATOR OPTICALSTD 8 DVNC (TROCAR) IMPLANT
PAD ARMBOARD 7.5X6 YLW CONV (MISCELLANEOUS) ×4 IMPLANT
SEAL CANN UNIV 5-8 DVNC XI (MISCELLANEOUS) ×2 IMPLANT
SEAL XI 5MM-8MM UNIVERSAL (MISCELLANEOUS) ×6
SET IRRIG TUBING LAPAROSCOPIC (IRRIGATION / IRRIGATOR) IMPLANT
SET TUBE SMOKE EVAC HIGH FLOW (TUBING) ×2 IMPLANT
STOPCOCK 4 WAY LG BORE MALE ST (IV SETS) ×2 IMPLANT
SUT MNCRL AB 4-0 PS2 18 (SUTURE) ×2 IMPLANT
SUT VIC AB 2-0 SH 27 (SUTURE) ×2
SUT VIC AB 2-0 SH 27X BRD (SUTURE) IMPLANT
SUT VLOC 180 2-0 6IN GS21 (SUTURE) ×2 IMPLANT
SUT VLOC 180 2-0 9IN GS21 (SUTURE) ×2 IMPLANT
SYR 30ML SLIP (SYRINGE) ×1 IMPLANT
SYR TOOMEY 50ML (SYRINGE) ×1 IMPLANT
TOWEL GREEN STERILE FF (TOWEL DISPOSABLE) ×2 IMPLANT
TRAY FOLEY MTR SLVR 14FR STAT (SET/KITS/TRAYS/PACK) IMPLANT
TRAY FOLEY MTR SLVR 16FR STAT (SET/KITS/TRAYS/PACK) IMPLANT
TRAY LAPAROSCOPIC MC (CUSTOM PROCEDURE TRAY) ×2 IMPLANT

## 2021-06-19 NOTE — Op Note (Signed)
06/19/2021 ? ?3:53 PM ? ?PATIENT:  [name]  66 y.o. male ? ?PRE-OPERATIVE DIAGNOSIS:  RECURRENT INCARCERATED RIGHT INGUINAL HERNIA ? ?POST-OPERATIVE DIAGNOSIS:  RECURRENT INCARCERATED RIGHT INGUINAL HERNIA ? ?PROCEDURE:  Procedure(s): ?XI ROBOTIC ASSISTED RIGHT INGUINAL HERNIA REPAIR WITH MESH (Right) ?INSERTION OF MESH (Right) ? ?SURGEON:  Surgeon(s) and Role: ?   Axel Filler, MD - Primary ? ?ASSISTANTS: Berenda Morale, RNFA  ? ?ANESTHESIA:   local and general ? ?EBL:  minimal  ? ?BLOOD ADMINISTERED:none ? ?DRAINS: none  ? ?LOCAL MEDICATIONS USED:  BUPIVICAINE  and OTHER exaprel ? ?SPECIMEN:  No Specimen ? ?DISPOSITION OF SPECIMEN:  N/A ? ?COUNTS:  YES ? ?TOURNIQUET:  * No tourniquets in log * ? ?DICTATION: .Dragon Dictation ? ?Details of the procedure:  ? ?Indication for procedure: PT ha da previous open IHR in last year.  He had an early recurrence.  He was counseled and wanted to have this repaired. ? ?Findings:  Pt with an incarcerated small bowel containing recurrent RIH.  Very large hernia defect.  I cut through the sac and abandoned the sac.  This was ligated with a running 2-0 vicryl.  16x12 cm PRogrip mesh was placed. ? ?Details of the procedure: ? ?The patient was taken back to the operating room. The patient was placed in supine position with bilateral SCDs in place.  A Foley catheter was placed.  The patient was prepped and draped in the usual sterile fashion.  After appropriate anitbiotics were confirmed, a time-out was confirmed and all facts were verified. ? ?At this time a Veress needle technique was used to inspect the abdomen approximately 10 cm from the valgus and the paramedian line. This time a 8 mm robotic trocar was placed into the abdomen. The camera was placed there was no injury to any intra-abdominal organs. A 22mm umbilical port was placed just superior to the umbilicus. An 8 mm port was placed approximately 10 cm lateral to the umbilicus on the left paramedian side.   ? ?Robot was positioned over the patient and the ports were docked in the usual fashion. ? ? The incarcerated small bowel was reduced.  At this time the right-sided peritoneum was taken down from the medial umbilical ligament laterally. The pre-peritoneal space was entered. Dissection was taken down to Cooper's ligament.  There was some adhesions from his previous renal stone surgery.  At this time it was apparent there was an indirect hernia.  At this time I proceeded dissect out the rest Cooper's ligament and the medial to lateral direction. I proceeded laterally to dissect the spermatic cord. The spermatic cord was circumferentially dissected away from the surrounding musculature and tissue. The vas deferens was identified and protected all portions of the case. There was a very large indirect hernia.  The hernia sac was dissected away from the spermatic cord.  The hernia sac did extend down to the scrotum.  At this point secondary to difficulty in grasping the sac I decided to transect the sac approximately two thirds of the way into the scrotum.  This was dissected back and down.  A pocket was created laterally for the mesh.  At this time the piece of Progrip 16x12cm mesh was in placed into the dissected area. This covered both the direct and indirect spaces appropriately. This also covered  The femoral space.  The mesh lay flat from medial to lateral. At this time a 2 V- lock stitch was used to close the peritoneum in a standard  running fashion.  A 2-0 Vicryl was then used to ligate the transected peritoneum.  This was done in a running fashion.  The hernia sac was intact up to the anterior abdominal wall. ? ?At this time the robot was undocked. The umbilical port site was reapproximated using a 0 Vicryl via an Endo Close device ?1. All ports were removed. The skin was reapproximated and all port sites using 4-0 Monocryl subcuticular fashion. ? ?The patient the procedure well was taken to the recovery   ? ? ?PLAN OF CARE: Discharge to home after PACU ? ?PATIENT DISPOSITION:  PACU - hemodynamically stable. ?  ?Delay start of Pharmacological VTE agent (>24hrs) due to surgical blood loss or risk of bleeding: not applicable ? ?

## 2021-06-19 NOTE — Anesthesia Preprocedure Evaluation (Addendum)
Anesthesia Evaluation  ?Patient identified by MRN, date of birth, ID band ?Patient awake ? ? ? ?Reviewed: ?Allergy & Precautions, NPO status , Patient's Chart, lab work & pertinent test results ? ?History of Anesthesia Complications ?Negative for: history of anesthetic complications ? ?Airway ?Mallampati: II ? ?TM Distance: >3 FB ?Neck ROM: Full ? ? ? Dental ? ?(+) Dental Advisory Given, Teeth Intact ?  ?Pulmonary ?sleep apnea and Continuous Positive Airway Pressure Ventilation ,  ?  ?Pulmonary exam normal ? ? ? ? ? ? ? Cardiovascular ?negative cardio ROS ?Normal cardiovascular exam ? ? ?  ?Neuro/Psych ? Neuromuscular disease (Parkinson's disease) negative psych ROS  ? GI/Hepatic ?negative GI ROS, Neg liver ROS,   ?Endo/Other  ? ?Obesity ? ? Renal/GU ?negative Renal ROS  ? ?  ?Musculoskeletal ?negative musculoskeletal ROS ?(+)  ? Abdominal ?  ?Peds ? Hematology ?negative hematology ROS ?(+)   ?Anesthesia Other Findings ? ? Reproductive/Obstetrics ? ?  ? ? ? ? ? ? ? ? ? ? ? ? ? ?  ?  ? ? ? ? ? ? ? ?Anesthesia Physical ?Anesthesia Plan ? ?ASA: 2 ? ?Anesthesia Plan: General  ? ?Post-op Pain Management: Tylenol PO (pre-op)* and Celebrex PO (pre-op)*  ? ?Induction: Intravenous ? ?PONV Risk Score and Plan: 2 and Treatment may vary due to age or medical condition, Ondansetron, Dexamethasone and Midazolam ? ?Airway Management Planned: Oral ETT ? ?Additional Equipment: None ? ?Intra-op Plan:  ? ?Post-operative Plan: Extubation in OR ? ?Informed Consent: I have reviewed the patients History and Physical, chart, labs and discussed the procedure including the risks, benefits and alternatives for the proposed anesthesia with the patient or authorized representative who has indicated his/her understanding and acceptance.  ? ? ? ?Dental advisory given ? ?Plan Discussed with: CRNA and Anesthesiologist ? ?Anesthesia Plan Comments:   ? ? ? ? ? ?Anesthesia Quick Evaluation ? ?

## 2021-06-19 NOTE — Transfer of Care (Signed)
Immediate Anesthesia Transfer of Care Note ? ?Patient: [name] ? ?Procedure(s) Performed: XI ROBOTIC ASSISTED RIGHT INGUINAL HERNIA REPAIR WITH MESH (Right: Inguinal) ?INSERTION OF MESH (Right: Inguinal) ? ?Patient Location: PACU ? ?Anesthesia Type:General ? ?Level of Consciousness: drowsy and patient cooperative ? ?Airway & Oxygen Therapy: Patient Spontanous Breathing and Patient connected to face mask oxygen ? ?Post-op Assessment: Report given to RN and Post -op Vital signs reviewed and stable ? ?Post vital signs: Reviewed and stable ? ?Last Vitals:  ?Vitals Value Taken Time  ?BP 135/72 06/19/21 1610  ?Temp    ?Pulse 81 06/19/21 1611  ?Resp 22 06/19/21 1611  ?SpO2 99 % 06/19/21 1611  ?Vitals shown include unvalidated device data. ? ?Last Pain:  ?Vitals:  ? 06/19/21 1215  ?TempSrc:   ?PainSc: 0-No pain  ?   ? ?  ? ?Complications: No notable events documented. ?

## 2021-06-19 NOTE — Discharge Instructions (Signed)
[facility] _______[facility] ? ?INGUINAL HERNIA REPAIR: POST OP INSTRUCTIONS ? ?Always review your discharge instruction sheet given to you by the facility where your surgery was performed. ?IF YOU HAVE DISABILITY OR FAMILY LEAVE FORMS, YOU MUST BRING THEM TO THE OFFICE FOR PROCESSING.   ?DO NOT GIVE THEM TO YOUR DOCTOR. ? ?1. A  prescription for pain medication may be given to you upon discharge.  Take your pain medication as prescribed, if needed.  If narcotic pain medicine is not needed, then you may take acetaminophen (Tylenol) or ibuprofen (Advil) as needed. ?2. Take your usually prescribed medications unless otherwise directed. ?If you need a refill on your pain medication, please contact your pharmacy.  They will contact our office to request authorization. Prescriptions will not be filled after 5 pm or on week-ends. ?3. You should follow a light diet the first 24 hours after arrival home, such as soup and crackers, etc.  Be sure to include lots of fluids daily.  Resume your normal diet the day after surgery. ?4.Most patients will experience some swelling and bruising around the umbilicus or in the groin and scrotum.  Ice packs and reclining will help.  Swelling and bruising can take several days to resolve.  ?6. It is common to experience some constipation if taking pain medication after surgery.  Increasing fluid intake and taking a stool softener (such as Colace) will usually help or prevent this problem from occurring.  A mild laxative (Milk of Magnesia or Miralax) should be taken according to package directions if there are no bowel movements after 48 hours. ?7. Unless discharge instructions indicate otherwise, you may remove your bandages 24-48 hours after surgery, and you may shower at that time.  You may have steri-strips (small skin tapes) in place directly over the incision.  These strips should be left on the skin for 7-10 days.  If your surgeon used skin glue on the incision, you may  shower in 24 hours.  The glue will flake off over the next 2-3 weeks.  Any sutures or staples will be removed at the office during your follow-up visit. ?8. ACTIVITIES:  You may resume regular (light) daily activities beginning the next day--such as daily self-care, walking, climbing stairs--gradually increasing activities as tolerated.  You may have sexual intercourse when it is comfortable.  Refrain from any heavy lifting or straining until approved by your doctor. ? ?a.You may drive when you are no longer taking prescription pain medication, you can comfortably wear a seatbelt, and you can safely maneuver your car and apply brakes. ?b.RETURN TO WORK:   ?_____________________________________________ ? ?9.You should see your doctor in the office for a follow-up appointment approximately 2-3 weeks after your surgery.  Make sure that you call for this appointment within a day or two after you arrive home to insure a convenient appointment time. ?10.OTHER INSTRUCTIONS: _________________________ ?   _____________________________________ ? ?WHEN TO CALL YOUR DOCTOR: ?Fever over 101.0 ?Inability to urinate ?Nausea and/or vomiting ?Extreme swelling or bruising ?Continued bleeding from incision. ?Increased pain, redness, or drainage from the incision ? ?The clinic staff is available to answer your questions during regular business hours.  Please don?t hesitate to call and ask to speak to one of the nurses for clinical concerns.  If you have a medical emergency, go to the nearest emergency room or call 911.  A surgeon from [facility] is always on call at the hospital ? ? ?[address], [address], [place],     [address] ? ? [address] [address], [place],    [address] ?[phone] ? [phone] ? FAX [phone] ?Web site: [email or web address] ? ?

## 2021-06-19 NOTE — Anesthesia Postprocedure Evaluation (Signed)
Anesthesia Post Note ? ?Patient: [name] ? ?Procedure(s) Performed: XI ROBOTIC ASSISTED RIGHT INGUINAL HERNIA REPAIR WITH MESH (Right: Inguinal) ?INSERTION OF MESH (Right: Inguinal) ? ?  ? ?Patient location during evaluation: PACU ?Anesthesia Type: General ?Level of consciousness: awake and alert ?Pain management: pain level controlled ?Vital Signs Assessment: post-procedure vital signs reviewed and stable ?Respiratory status: spontaneous breathing, nonlabored ventilation and respiratory function stable ?Cardiovascular status: stable and blood pressure returned to baseline ?Anesthetic complications: no ? ? ?No notable events documented. ? ?Last Vitals:  ?Vitals:  ? 06/19/21 1610 06/19/21 1625  ?BP: 135/72 (!) 148/84  ?Pulse: 81 83  ?Resp: 15 17  ?Temp: 36.6 ?C   ?SpO2: 100% 96%  ?  ?Last Pain:  ?Vitals:  ? 06/19/21 1610  ?TempSrc:   ?PainSc: Asleep  ? ? ?  ?  ?  ?  ?  ?  ? ?Beryle Lathe ? ? ? ? ?

## 2021-06-19 NOTE — Anesthesia Procedure Notes (Addendum)
Procedure Name: Intubation ?Date/Time: 06/19/2021 2:21 PM ?Performed by: Janene Harvey, CRNA ?Pre-anesthesia Checklist: Patient identified, Emergency Drugs available, Suction available and Patient being monitored ?Patient Re-evaluated:Patient Re-evaluated prior to induction ?Oxygen Delivery Method: Circle system utilized ?Preoxygenation: Pre-oxygenation with 100% oxygen ?Induction Type: IV induction ?Ventilation: Mask ventilation without difficulty ?Laryngoscope Size: Mac and 4 ?Grade View: Grade I ?Tube type: Oral ?Tube size: 7.5 mm ?Number of attempts: 1 ?Airway Equipment and Method: Stylet ?Placement Confirmation: ETT inserted through vocal cords under direct vision, positive ETCO2 and breath sounds checked- equal and bilateral ?Secured at: 23 cm ?Tube secured with: Tape ?Dental Injury: Teeth and Oropharynx as per pre-operative assessment  ?Comments: Intubation by SRNA ? ? ? ? ?

## 2021-06-19 NOTE — Interval H&P Note (Signed)
History and Physical Interval Note: ? ?06/19/2021 ?1:41 PM ? ?[name]  has presented today for surgery, with the diagnosis of RECURRENT RIGHT INGUINAL HERNIA.  The various methods of treatment have been discussed with the patient and family. After consideration of risks, benefits and other options for treatment, the patient has consented to  Procedure(s): ?ROBOTIC RIGHT INGUINAL HERNIA REPAIR WITH MESH POSSIBLE OPEN (Right) as a surgical intervention.  The patient's history has been reviewed, patient examined, no change in status, stable for surgery.  I have reviewed the patient's chart and labs.  Questions were answered to the patient's satisfaction.   ? ? ?Axel Filler ? ? ?

## 2021-06-20 ENCOUNTER — Encounter (HOSPITAL_COMMUNITY): Payer: Self-pay | Admitting: General Surgery

## 2021-07-11 ENCOUNTER — Ambulatory Visit (INDEPENDENT_AMBULATORY_CARE_PROVIDER_SITE_OTHER): Payer: Managed Care, Other (non HMO) | Admitting: Neurology

## 2021-07-11 ENCOUNTER — Encounter: Payer: Self-pay | Admitting: Neurology

## 2021-07-11 VITALS — BP 120/73 | HR 76 | Ht 75.0 in | Wt 271.8 lb

## 2021-07-11 DIAGNOSIS — G20 Parkinson's disease: Secondary | ICD-10-CM | POA: Diagnosis not present

## 2021-07-11 DIAGNOSIS — G20A1 Parkinson's disease without dyskinesia, without mention of fluctuations: Secondary | ICD-10-CM

## 2021-07-11 DIAGNOSIS — G4733 Obstructive sleep apnea (adult) (pediatric): Secondary | ICD-10-CM | POA: Diagnosis not present

## 2021-07-11 DIAGNOSIS — M5440 Lumbago with sciatica, unspecified side: Secondary | ICD-10-CM | POA: Diagnosis not present

## 2021-07-11 DIAGNOSIS — G8929 Other chronic pain: Secondary | ICD-10-CM | POA: Diagnosis not present

## 2021-07-11 NOTE — Patient Instructions (Signed)
It was nice to see you again today.  As discussed, we will keep your medication regimen the same.  Please continue to stay well-hydrated and try to exercise on a regular basis, be proactive about constipation issues.  Please try to get back on track with your AutoPap machine.  Please follow-up with orthopedics about your sciatica symptoms.  Follow-up in 6 months, sooner if needed.

## 2021-07-11 NOTE — Progress Notes (Signed)
Subjective:    Patient ID: [name] is a 66 y.o. male.  HPI    Interim history:   [name] is a 66 year old right-handed gentleman with an underlying history of hyperlipidemia and kidney stones, s/p DBS for PD, who presents for followup consultation of his right-sided predominant Parkinson's disease, and OSA on AutoPap therapy.  The patient is unaccompanied today. I last saw him on 11/08/2020 at which time he was on Sinemet to 1-1/2 pills 5 times a day. This was done in July '22, when he last saw Elbert Ewings at [facility]. He had a swallow study on 10/11/2020, which showed small penetration with thin liquids, trace to mild residuals with pudding, no frank aspiration. He also had an upper GI and HIDA scan.  He was having quite a bit of GI issues.  He retired in June 2022. His older son moved to [place]. He was using his AutoPap. He c/o right hip pain.  He will eventually need right foot surgery, he was planning to getting an opinion from Dr. Tonita Cong who came recommended.   Today, 07/11/2021: He reports having had lower back pain, he has sciatica symptoms.  He ended up having a second inguinal hernia surgery on the right in May 2023, he had the original hernia surgery in November 2022.  He went to the emergency room in April 2023 with palpitations.  He admits that he became anxious and needed to get checked out.  He was alone at home at the time in the evening when the symptoms started.  He drove himself to to the ER.  I reviewed the ER records.  He has an appointment at [facility] in July 2023.  He has not seen orthopedics for his back pain but had seen them for his foot pain.  He feels that he sleeps better in a recliner and has therefore also not used his CPAP because the machine is still in the bedroom.  He would be willing to try to move it to the recliner or try to get back to the habit of sleeping in his bed.  He had discomfort especially when his hernia was bothering him but hernia pain is  better.  He tries to hydrate well.  He has not been exercising very much due to back pain.  In the past 90 days he has not used his AutoPap but for 2 days.  The patient's allergies, current medications, family history, past medical history, past social history, past surgical history and problem list were reviewed and updated as appropriate.    Previously (copied from previous notes for reference):     I saw him on 03/13/2020, at which time he reported feeling fairly stable.  He was quite consistent with his AutoPap.  He was not quite ready for his second DBS, he had a follow-up with Dr. Linus Mako in January 2022.  He had noticed more restless legs type symptoms.  He was advised to increase his gabapentin to up to 300 mg at bedtime.  We decided to keep his Sinemet at 1-1/2 pills 4 times a day, Neupro at 4 mg once daily and Azilect 1 mg once daily.    I saw him on 08/23/2019, at which time he reported that the programming change recently had helped, he had a recent appointment at [facility].  He was advised to continue with his Sinemet which was 1-1/2 pills 4 times a day although he had been advised to increase it to 5 times a  day previously when he went to [facility].  He felt stable on the 4 times a day scheduling.  He was having more restless leg symptoms.  He was advised to start low-dose gabapentin 100 mg strength.    I reviewed the last 30 days, average usage was 5 hours and 20 minutes, date range was 02/12/2020 through 03/12/2020.  Average AHI 0.8/h, average pressure for the 95th percentile 8.3 cm, average leak for the 95th percentile 14.2 L/min.  Percent use days greater than 4 hours at 80% which is very good.         I saw him on 04/20/2019, at which time I suggested he increase his Sinemet.       I saw him on 12/21/18, at which time he was compliant with his AutoPap.  He reported doing okay with it.  He had ongoing issues with lower extremity swelling.  He was advised to exercise on a more  consistent basis.  He had interim problems with hernia and reported that he may need surgery.  He ended up having right hydronephrosis and required a stent placement on 04/14/2019.    He had an interim appointment with [facility] for programming on 12/30/2018 and a subsequent appointment with Dr. Deboraha Sprang on 01/20/2019 and I reviewed the office note.    I reviewed his AutoPap compliance data from 03/20/2019 through 04/18/2019 which is a total of 30 days, during which time he used his machine 27 days with percent use days greater than 4 hours at 83%, indicating very good compliance with an average usage of 5 hours and 35 minutes for days on treatment, residual AHI at goal at 0.5/h, 95th percentile pressure at 8.3 cm with a range of 7 cm to 13 cm with EPR, leak acceptable with a 95th percentile at 10.3 L/min.     I saw him in a virtual visit on 07/30/2018, at which time he was compliant with his AutoPap.  I suggested we continue with his medication regimen including Azilect 1 mg once daily, Sinemet 1-1/2 pills 4 times a day and Neupro patch 4 mg daily.     I reviewed his AutoPap compliance data from 11/17/2018 through 12/16/2018 which is a total of 30 days, during which time he used his machine 27 days with percent use days greater than 4 hours at 80%, indicating very good compliance with an average usage of 5 hours and 58 minutes, residual AHI at goal at 0.7/h, leak high with a 95th percentile at 29.5 L/min on a pressure range of 7 cm to 13 cm with EPR, 95th percentile of pressure at 8.4 cm.       I saw him on 11/06/2017, at which time he felt stable from the Parkinson's standpoint.  He had recently seen Dr. Linus Mako at [facility] in August and programming was kept the same.  His lower extremity swelling had gotten worse.     He emailed in December and in early January 2020 we reduced his Neupro patch from 6 mg to 4 mg daily.   He saw Cecille Rubin, nurse practitioner in the interim on 03/31/2018 for his first  AutoPap visit after his home sleep test and he was adequate with his AutoPap compliance at the time.    I reviewed his AutoPap compliance data from 06/29/2018 through 07/28/2018 which is a total of 30 days, during which time he used his machine 26 days with percent used days greater than 4 hours at 77%, indicating good compliance with  an average usage of 4 hours and 43 minutes only, residual AHI at goal at 1/h, 95th percentile of pressure at 8.3 cm, leak on the higher side with a 95th percentile at 18.3 L/min on a pressure range of 7 cm to 13 cm with EPR.    I saw him on 05/06/2017, at which time he was doing well, walking and tremor were since his first programming appointment on 04/18/2017. Of note, he had his left DBS surgery on 04/04/2017 under Dr. Salomon Fick. He has had a very good experience overall from beginning to end.      I saw him on 11/05/2016, at which time he reported no significant repercussions after reducing the Neupro and increasing the Sinemet. His swelling had improved a little bit. He was still in the process of being evaluated for DBS surgery. I suggested we keep his medication regimen the same. We talked about the importance of losing weight especially in preparation for elective surgery.     I saw him on 07/04/2016, at which time he reported doing okay, was planning to pursue DBS surgery perhaps later this year. He was quite apprehensive. He had not scheduled his psychological evaluation or surgical consultation. He was overall considered a good candidate for DBS treatment per neurology. He was more on time with his Sinemet and noticed improvement in his symptoms, lower extremity swelling was slightly worse. Neupro was working well at 8 mg, but d/t swelling, I suggested with reduce it. I also suggested we increase his Sinemet to 1-1/2 pills for times a day in lieu of keeping the Neupro at 8 mg daily. He was no longer taking gabapentin and his foot pain was indeed a little bit better with  time, gabapentin made him too sleepy.    I saw him on 03/05/2016, at which time he reported taking C/L 4 times a day, but not always all 4 doses, as he would forget at times. Low-dose gabapentin prn was helping his foot pain. No recent falls, no recent mood or memory issues, no word on the referral from [facility]. Had a recent cold and congestion. He was advised to continue with the current management, including Sinemet 1 pill 4 times a day, Azilect 1 mg once daily, Neupro patch 8 mg once daily, gabapentin 300 mg at night as needed.   I resubmitted referral to [facility] for DBS Eval and patient was seen in the interim by Dr. Linus Mako on 05/24/16. I reviewed the note.     I saw him on 12/04/2015, at which time he reported ongoing issues with his right foot including status post surgery in April 2017 for hammertoes but he had ongoing issues with pain and more stiffness. He felt overall that his right side was more stiff and tremulous. Since starting Sinemet he had noted no telltale difference in his symptoms. He had consulted with different podiatrists and had tried orthotics. Mood and memory were stable. He was not sleeping very well. We talked about DBS treatment in more detail. I made a referral to Dr. Linus Mako at [facility]. I also increased his Sinemet to 1 pill 4 times a day. I suggested we try him on low-dose gabapentin for his right foot pain.   I saw him on 08/01/2015, at which time he reported feeling worse. He had more difficulty exercising, he had gained weight. He had foot surgery and his balance had been worse since then. He had hammertoe surgery on 05/25/2015. Because of decrease in mobility after his surgery his  weight increase. His tremor was worse. Memory was stable. I suggested we continue with Neupro 8 mg patch daily. I suggested he continue with Azilect once daily. I suggested we start him on the rytary 95 mg strength with gradual titration. However, he called in July reporting that the medication was  too expensive, I therefore, switched him to Sinemet generic. He also requested a handicap placard form to be filled out which I provided in September.   Of note, he missed an appointment on 06/20/2015 due to being sick. I saw him on 12/01/2014, at which time he reported a change in his insurance, mood and memory were stable, lower extremity swelling was stable, we kept his Neupro patch at 8 mg an Azilect 1 mg once daily the same. He was working full-time, also active with his 2 teenage boys, 64 year old an 45 year old. His wife had a recent change in her job.    I saw him on 08/02/2014, at which time he reported doing well. He had some leg swelling. His right hand swelling was a little worse. Cognitively and mood wise he was stable. Balance may have been off at times but generally speaking he was doing well.    I saw him on 04/05/2014, at which time he reported feeling fairly stable but his wife felt his balance was not as good. He was working out with a Clinical research associate. He had picked up boxing. He was working full-time. He cut back on his sodas. I kept him on his medications, Neupro patch 6 mg and Azilect 1 mg. He had some ongoing issues with lower extremity swelling and I referred him to vascular surgery for consultation. He was seen by a vascular specialist, Dr. Scot Dock on 04/13/2014. He had Doppler studies to his lower extremities and was advised he had no DVT and good arterial flow. His swelling improved.    I saw him on 12/03/2013, at which time he reported doing well and had noted a benefit from Neupro patch 6 mg. I continued him on this dose as well as Azilect 1 mg once daily. His exam was stable.   I saw him on 07/28/2013, at which time he reported some worsening of his tremor and his gait. He was able to tolerate neupro patch 4 mg strength. He has had some skin irritation, most likely from the adhesive. He was not exercising regularly but endorsed being active and working full-time. His memory was  stable. He denies any impulse control disorder but had mild daytime somnolence which was not as severe as when he was on Requip long-acting.     I saw him on 02/05/2013, at which time I felt his physical exam was a little worse since stopping Requip XL. He had stopped this because of daytime somnolence and severe nausea. I suggested we start him on Neupro patch. I provided him with samples. We talked about doing a sleep study down the [place].     I first met him on 07/15/2012, at which time I suggested he start taking coenzyme Q 10. I also suggested that he switch his Requip XL 4 mg to nighttime because of report of daytime somnolence. I did not increase make any other changes to his medications and felt that he was overall stable.     He previously followed with Dr. Jeneen Rinks love and was last seen by him on 03/17/2012 at which time Dr. Erling Cruz increase his Requip XL and continued him on Azilect. He was diagnosed in 12/2008, and Sx go back  to a year prior to that.   MRI brain without contrast was done in the past. He has been on rasagiline, which improved his tremor. 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 There is no family history of tremor. There is no history of REM behavior disorder. He has had no falls, or hallucinations or involuntary movements otherwise. He exercises not very regularly. His memory and mood have been stable. He has no problems with lower extremities swelling or compulsive thoughts or gambling. He works full-time as a Engineer, site.    His Past Medical History Is Significant For: Past Medical History:  Diagnosis Date   History of kidney stones 04/14/2019   surgery to remove   Lesion of ulnar nerve    no current problems   Parkinson's disease ([place])    Sleep apnea    occasional uses cpap - setting at 9    His Past Surgical History Is Significant For: Past Surgical History:  Procedure Laterality Date   COLONOSCOPY  2021    CYSTOSCOPY WITH RETROGRADE PYELOGRAM, URETEROSCOPY AND STENT PLACEMENT Right 04/14/2019   Procedure: CYSTOSCOPY WITH RETROGRADE PYELOGRAM, URETEROSCOPY AND STENT PLACEMENT;  Surgeon: Lucas Mallow, MD;  Location: [facility];  Service: Urology;  Laterality: Right;   DBS surgery   03/2017   x 2 surg for phase 1 and phase 2 - brain stimulator for parkinson's disease   HAMMERTOE RECONSTRUCTION WITH WEIL OSTEOTOMY Right 05/25/2015   Procedure: RIGHT SECOND TOE METATARSAL WEIL OSTEOTOMY  HAMMERTOE CORRECTION COLLATERAL LIGAMENT REPAIR; FLEXOR TO EXTENSOR TRANSFER ;  Surgeon: Wylene Simmer, MD;  Location: [place];  Service: Orthopedics;  Laterality: Right;   INGUINAL HERNIA REPAIR Right 12/21/2020   Procedure: REPAIR RIGHT INGUINAL HERNIA REPAIR WITH MESH;  Surgeon: Georganna Skeans, MD;  Location: [place];  Service: General;  Laterality: Right;   INSERTION OF MESH Right 12/21/2020   Procedure: INSERTION OF MESH;  Surgeon: Georganna Skeans, MD;  Location: [place];  Service: General;  Laterality: Right;   INSERTION OF MESH Right 06/19/2021   Procedure: INSERTION OF MESH;  Surgeon: Ralene Ok, MD;  Location: [place];  Service: General;  Laterality: Right;   kidney stones  1991   surgery to remove   XI ROBOTIC ASSISTED INGUINAL HERNIA REPAIR WITH MESH Right 06/19/2021   Procedure: XI ROBOTIC ASSISTED RIGHT INGUINAL HERNIA REPAIR WITH MESH;  Surgeon: Ralene Ok, MD;  Location: [place];  Service: General;  Laterality: Right;    His Family History Is Significant For: Family History  Problem Relation Age of Onset   Sleep apnea Mother    Heart failure Father    Cancer Father    Diabetes Father    Sleep apnea Brother    Parkinson's disease Neg Hx     His Social History Is Significant For: Social History   Socioeconomic History   Marital status: Married    Spouse name: Juliann Pulse    Number of children: 2   Years of education: Not on file   Highest education level: Bachelor's degree  (e.g., BA, AB, BS)  Occupational History   Not on file  Tobacco Use   Smoking status: Never   Smokeless tobacco: Never  Vaping Use   Vaping Use: Never used  Substance and Sexual Activity   Alcohol use: Yes    Comment: occasional   Drug use: No   Sexual activity: Not Currently  Other Topics Concern   Not on  file  Social History Narrative   Consumes 1 soda a day    Social Determinants of Radio broadcast assistant Strain: Not on file  Food Insecurity: Not on file  Transportation Needs: Not on file  Physical Activity: Not on file  Stress: Not on file  Social Connections: Not on file    His Allergies Are:  Allergies  Allergen Reactions   Ropinirole Hcl Other (See Comments) and Nausea Only  :   His Current Medications Are:  Outpatient Encounter Medications as of 07/11/2021  Medication Sig   carbidopa-levodopa (SINEMET IR) 25-100 MG tablet Take 1.5 tablets by mouth 4 (four) times daily. At 8, 12, 4 PM and 8 PM (Patient taking differently: Take 1.5 tablets by mouth 5 (five) times daily.)   rasagiline (AZILECT) 1 MG TABS tablet Take 1 tablet (1 mg total) by mouth daily.   rotigotine (NEUPRO) 4 MG/24HR Place 1 patch onto the skin daily.   calcium carbonate (TUMS EX) 750 MG chewable tablet Chew 750 mg by mouth daily as needed for heartburn.   [DISCONTINUED] acetaminophen (TYLENOL) 500 MG tablet Take 1,000 mg by mouth every 6 (six) hours as needed for mild pain.   [DISCONTINUED] traMADol (ULTRAM) 50 MG tablet Take 1 tablet (50 mg total) by mouth every 6 (six) hours as needed.   No facility-administered encounter medications on file as of 07/11/2021.  :  Review of Systems:  Out of a complete 14 point review of systems, all are reviewed and negative with the exception of these symptoms as listed below:  Review of Systems  Neurological:        Parkinson's disease.  Doing ok,  has sciatic nerve R leg. (On going for months).  Sleeping in recliner (not using cpap).   Objective:   Neurological Exam  Physical Exam Physical Examination:   Vitals:   07/11/21 1000  BP: 120/73  Pulse: 76    General Examination: The patient is a very pleasant 66 y.o. male in no acute distress. He appears well-developed and well-nourished and well groomed.   HEENT: Normocephalic, unremarkable scar left parietal scalp from DBS. Pupils are equal, round and reactive to light, extraocular tracking shows mild saccadic breakdown without nystagmus noted. There is no limitation to his gaze. There is mild decrease in eye blink rate. Hearing is intact. Face is symmetric with moderate facial masking, no lip, neck or jaw tremor. Oropharynx exam reveals moderate airway crowding. There is no sialorrhea, he has mild hypophonia, no dysarthria.   Chest: is clear to auscultation without wheezing, rhonchi or crackles noted.   Heart: sounds are regular and normal without murmurs, rubs or gallops noted.    Abdomen: is soft, non-tender and non-distended.   Extremities: There trace to 1+ pitting edema in the distal lower extremities bilaterally.   Skin: is warm and dry with no trophic changes noted. Chronic swelling type changes and mild redness in both distal lower extremities.   Musculoskeletal: exam reveals right hip pain, mild limitation in range of motion of the right leg.   Neurologically:  Mental status: The patient is awake and alert, paying good attention. He is able to completely provide the history. He is oriented to: person, place, time/date, situation, day of week, month of year and year. His memory, attention, language and knowledge are intact. There is no aphasia, agnosia, apraxia or anomia. There is perhaps mild bradyphrenia. Speech is mildly hypophonic with no dysarthria noted. Mood is congruent and affect is normal.    Cranial  nerves are as described above under HEENT exam. In addition, shoulder shrug is normal, but right shoulder is a little higher than left shoulder, stable findings.    Motor exam: Normal bulk, and strength for age is noted. There are no dyskinesias noted.  Tone is mild to moderately rigid with presence of cogwheeling in the right upper extremity and RLE. There is overall moderate bradykinesia. There is no drift or rebound.  There is an intermittent mild resting tremor in the LUE.  No obvious dyskinesias.    Fine motor skills exam:  moderate impairment noted on the right, mild to moderate impairment on the left, stable.   Cerebellar testing shows no dysmetria or intention tremor. There is no truncal or gait ataxia.    Sensory exam is intact to light touch in both upper and lower extremities.   Gait, station and balance: He stands up from the seated position with no significant difficulty but does need to push up with his hands. He needs no assistance. No veering to one side is noted. Posture fairly good today, no obvious limp today. Walks with decreased arm swing bilaterally, right more so than left. Balance preserved.    Assessment and Plan:    In summary, [name] is a very pleasant 66 year old male with an underlying history of hyperlipidemia and kidney stones, s/p L DBS for PD, who presents for followup consultation of his right-sided predominant Parkinson's disease, obstructive sleep apnea, currently not on AutoPap therapy. He was diagnosed with Parkinson's disease in 2010, symptoms date back to 2009. He had no telltale history of RBD, had some vivid dreams in the past. He is status post left STN DBS placement in February 2019, and has done quite well after that.  He has been on a DBS ambassador for other patients who are considering DBS surgery at [facility].  He continues to be on Neupro patch. We reduced the patch to 6 mg strength in May 2018 for lower extremity swelling. I suggested we reduce his Neupro patch further to 4 mg in early 2020. His home sleep test from 12/23/2017 showed severe obstructive sleep apnea with an AHI of 46.9/h, O2 nadir of  84%.  He has established treatment on AutoPap and has been compliant in the past but currently not using his machine because he has been sleeping in a recliner in the family room.  He is encouraged to try to get back on track with the AutoPap. He is not quite ready to pursue his second DBS, he has regular follow-up at [facility].  He is taking Sinemet 1-1/2 pills 5 times a day currently.  He tried gabapentin for restless leg symptoms at night but is not consistently using this.   He is advised to make an appointment with orthopedics for his radiating low back pain on the right side.  He is advised to follow-up to see me on a routine basis in 6 months, sooner if needed.  He has an appointment in July with Elbert Ewings, NP at [facility]. I answered all his questions today and he was in agreement with the plan. I spent 30 minutes in total face-to-face time and in reviewing records during pre-charting, more than 50% of which was spent in counseling and coordination of care, reviewing test results, reviewing medications and treatment regimen and/or in discussing or reviewing the diagnosis of PD, the prognosis and treatment options. Pertinent laboratory and imaging test results that were available during this visit with the patient  were reviewed by me and considered in my medical decision making (see chart for details).

## 2021-10-08 ENCOUNTER — Other Ambulatory Visit: Payer: Self-pay | Admitting: Neurology

## 2021-10-08 DIAGNOSIS — G20 Parkinson's disease: Secondary | ICD-10-CM

## 2021-12-16 IMAGING — CT CT ABD-PELV W/ CM
2 of 5 series · 16 of 46 positions shown, 18 images · IV contrast (APPLIED)
Comparison: CT abdomen and pelvis 03/19/2019.

CLINICAL DATA: Abdominal pain, right groin pain. Evaluate for
hernia.

EXAM:
CT ABDOMEN AND PELVIS WITH CONTRAST
TECHNIQUE: Multidetector CT imaging of the abdomen and pelvis was performed
using the standard protocol following bolus administration of
intravenous contrast.
CONTRAST:  100mL OMNIPAQUE IOHEXOL 300 MG/ML  SOLN

[Series 2: abd pel w · axial · 0.89mm/px · z∈[-281,+174]mm · 13 of 103 slices shown, 15 images]
[im 6/103  soft-tissue]
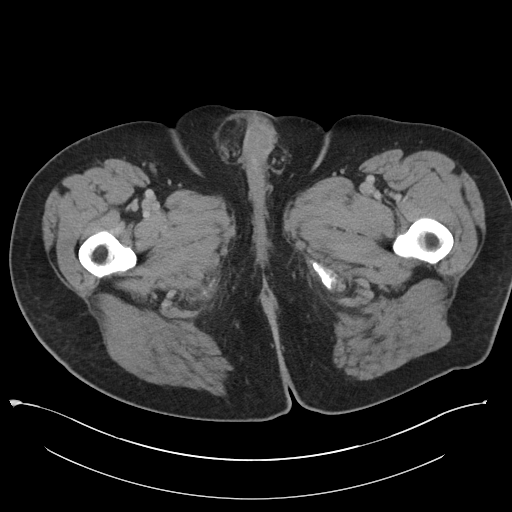
[im 6/103  bone]
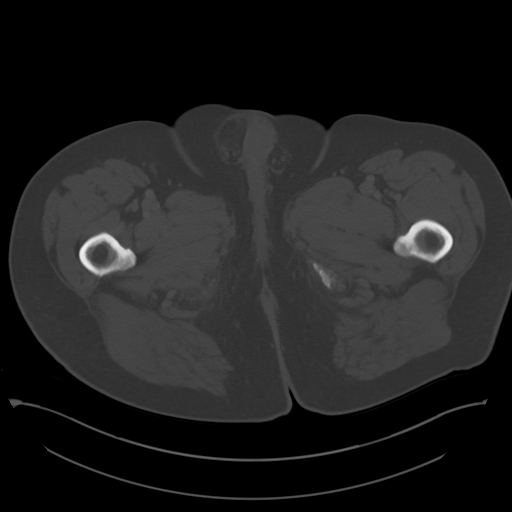
[im 12/103  soft-tissue]
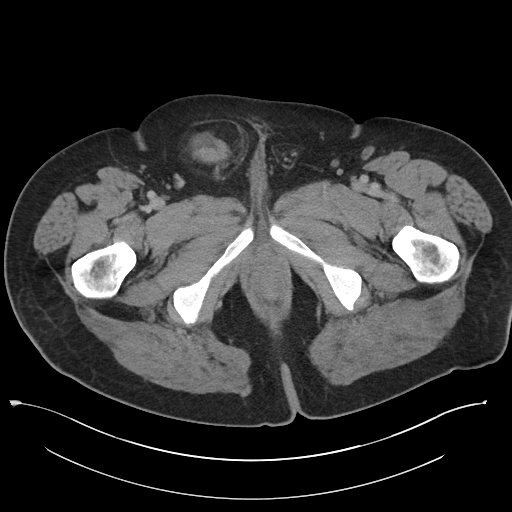
[im 23/103  soft-tissue]
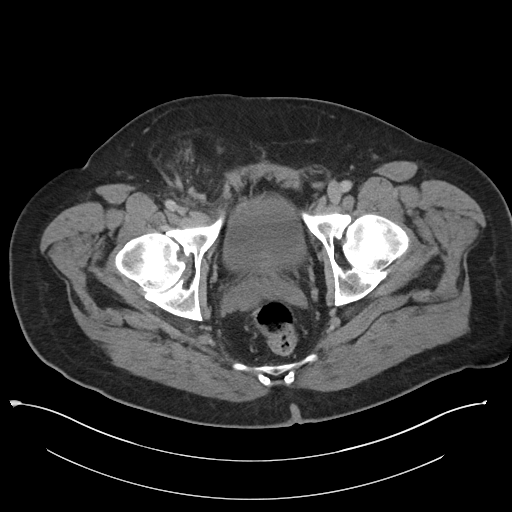
[im 29/103  soft-tissue]
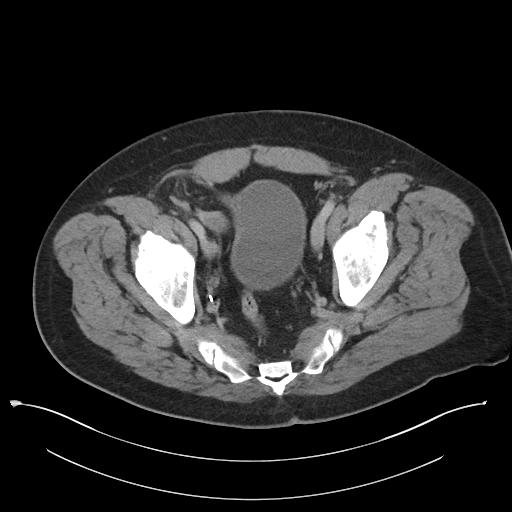
[im 35/103  soft-tissue]
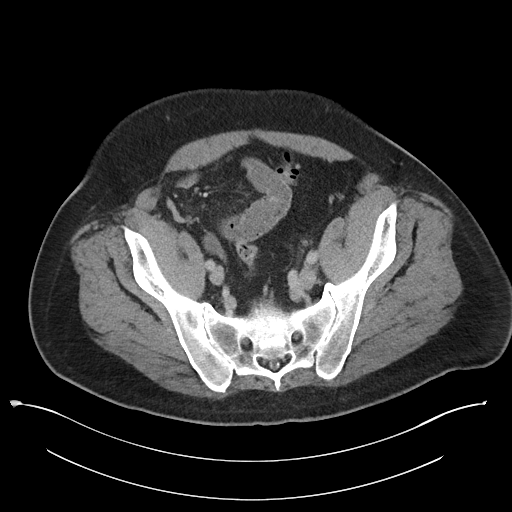
[im 46/103  soft-tissue]
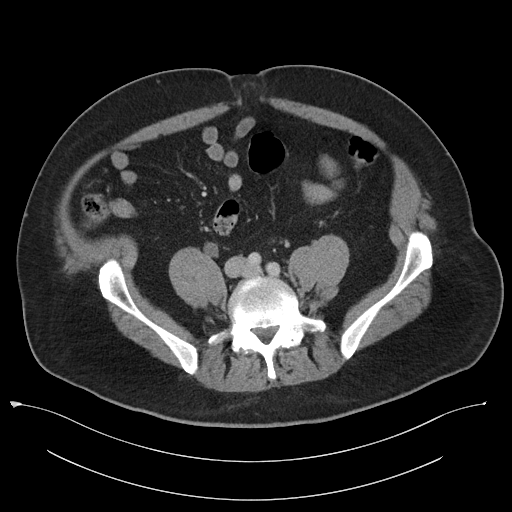
[im 52/103  soft-tissue]
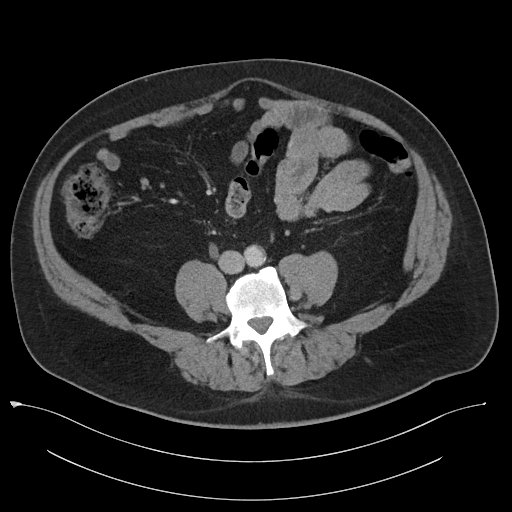
[im 57/103  soft-tissue]
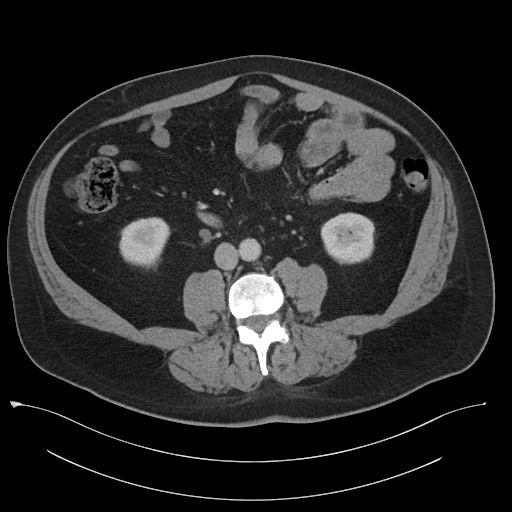
[im 69/103  soft-tissue]
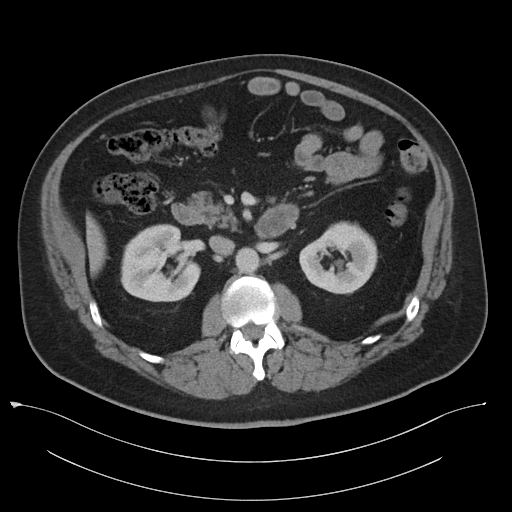
[im 69/103  bone]
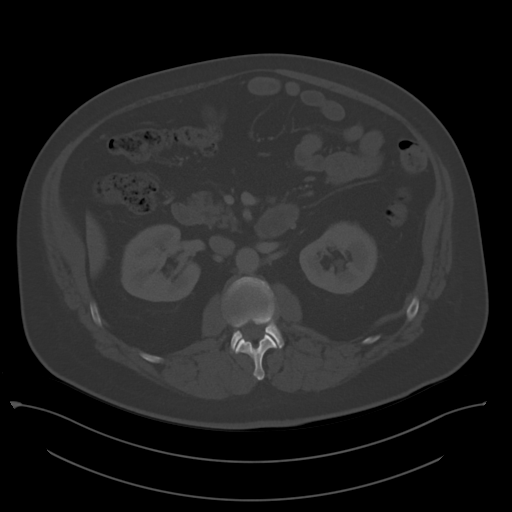
[im 74/103  soft-tissue]
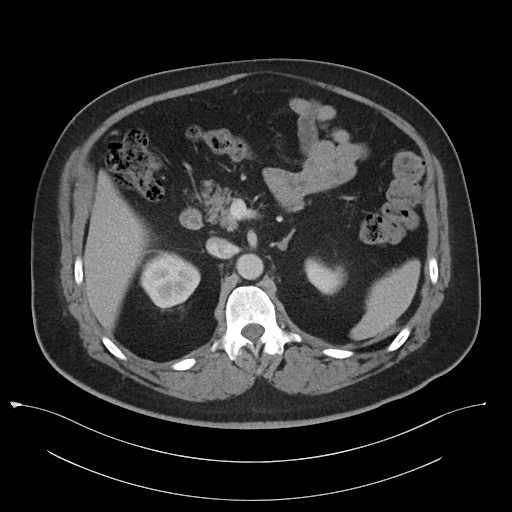
[im 80/103  soft-tissue]
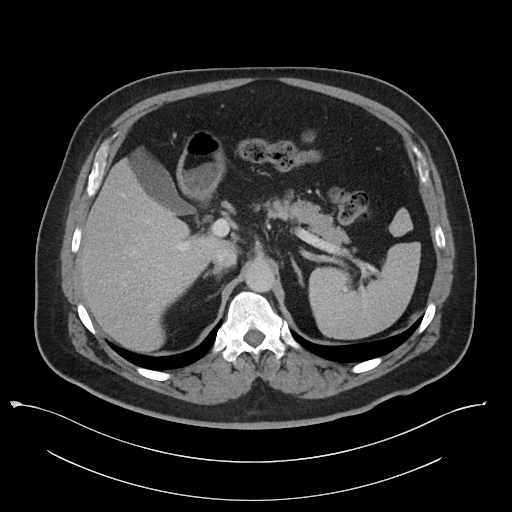
[im 91/103  soft-tissue]
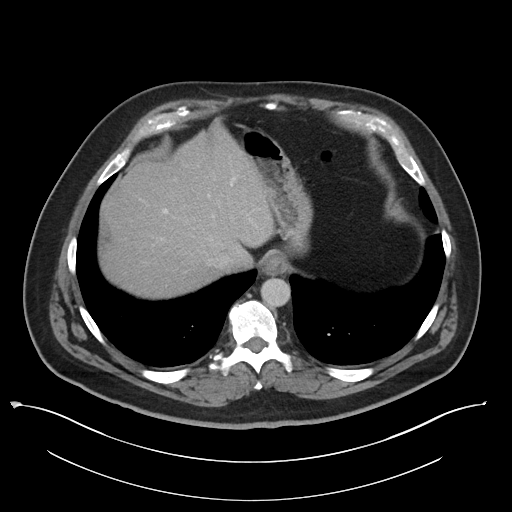
[im 97/103  soft-tissue]
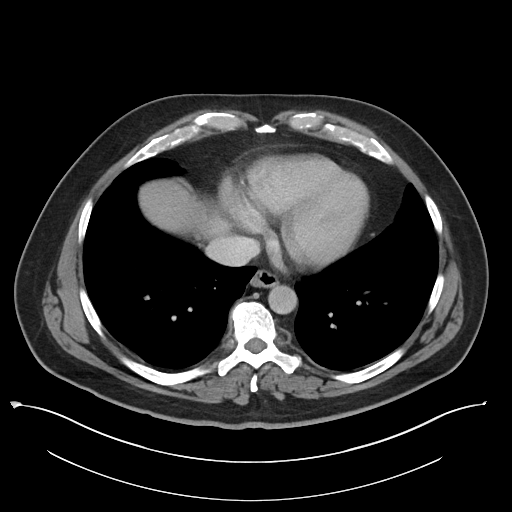

[Series 5: coronal · coronal · 0.91mm/px · 3 of 114 slices shown]
[im 38/114  soft-tissue]
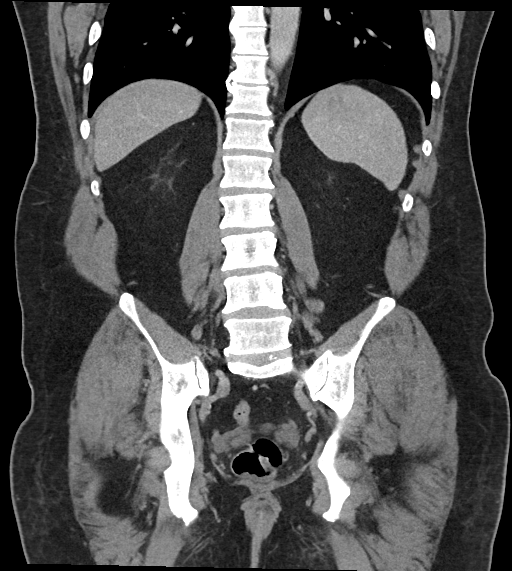
[im 51/114  soft-tissue]
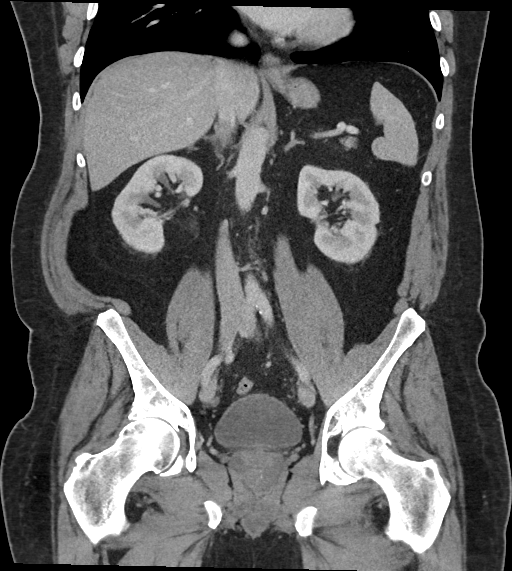
[im 63/114  soft-tissue]
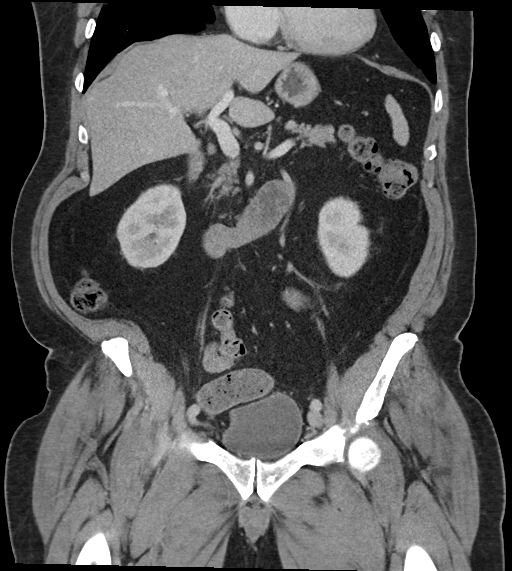

[16 of 46 positions shown; findings below may reference images not displayed]

FINDINGS: Lower chest: No acute abnormality.

Hepatobiliary: No focal liver abnormality is seen. No gallstones,
gallbladder wall thickening, or biliary dilatation.

Pancreas: Unremarkable. No pancreatic ductal dilatation or
surrounding inflammatory changes.

Spleen: Normal in size without focal abnormality.

Adrenals/Urinary Tract: Adrenal glands are unremarkable. Kidneys are
normal, without renal calculi, focal lesion, or hydronephrosis. The
bilateral adrenal glands and left kidney are within normal limits.
Right extrarenal pelvis and mild dilatation of the right ureter
without obstructing calculus appears unchanged from the prior. No
hydronephrosis. A small portion of the bladder wall is stretched
toward right inguinal hernia similar to the prior study.

Stomach/Bowel: Stomach is within normal limits. Appendix is not
seen. No evidence of bowel wall thickening, distention, or
inflammatory changes. There is sigmoid colon diverticulosis without
evidence for acute diverticulitis.

Vascular/Lymphatic: Aortic atherosclerosis. No enlarged abdominal or
pelvic lymph nodes.

Reproductive: Prostate is unremarkable.

Other: There is a moderate to large sized right inguinal hernia.
There is new nondilated small bowel within the hernia sac. There is
no fluid in the hernia sac.

Musculoskeletal: No acute or significant osseous findings.
IMPRESSION: 1. Moderate to large size right inguinal hernia now contains
nondilated small bowel, a new finding. No evidence for bowel
obstruction.
2. There is stable chronic right hydroureter likely due to
stricture. No obstructing calculus or mass identified.

## 2021-12-24 ENCOUNTER — Other Ambulatory Visit: Payer: Self-pay | Admitting: Neurology

## 2021-12-24 ENCOUNTER — Encounter: Payer: Self-pay | Admitting: Neurology

## 2021-12-26 IMAGING — RF DG ABDOMEN 1V
1 series · 4 of 4 positions shown · non-contrast
Comparison: 11/21/2020

CLINICAL DATA: Right inguinal hernia repair. Broken needle during
surgery.

EXAM:
ABDOMEN - 1 VIEW

[Series 1: run · 4 of 4 slices shown]
[im 1/4]
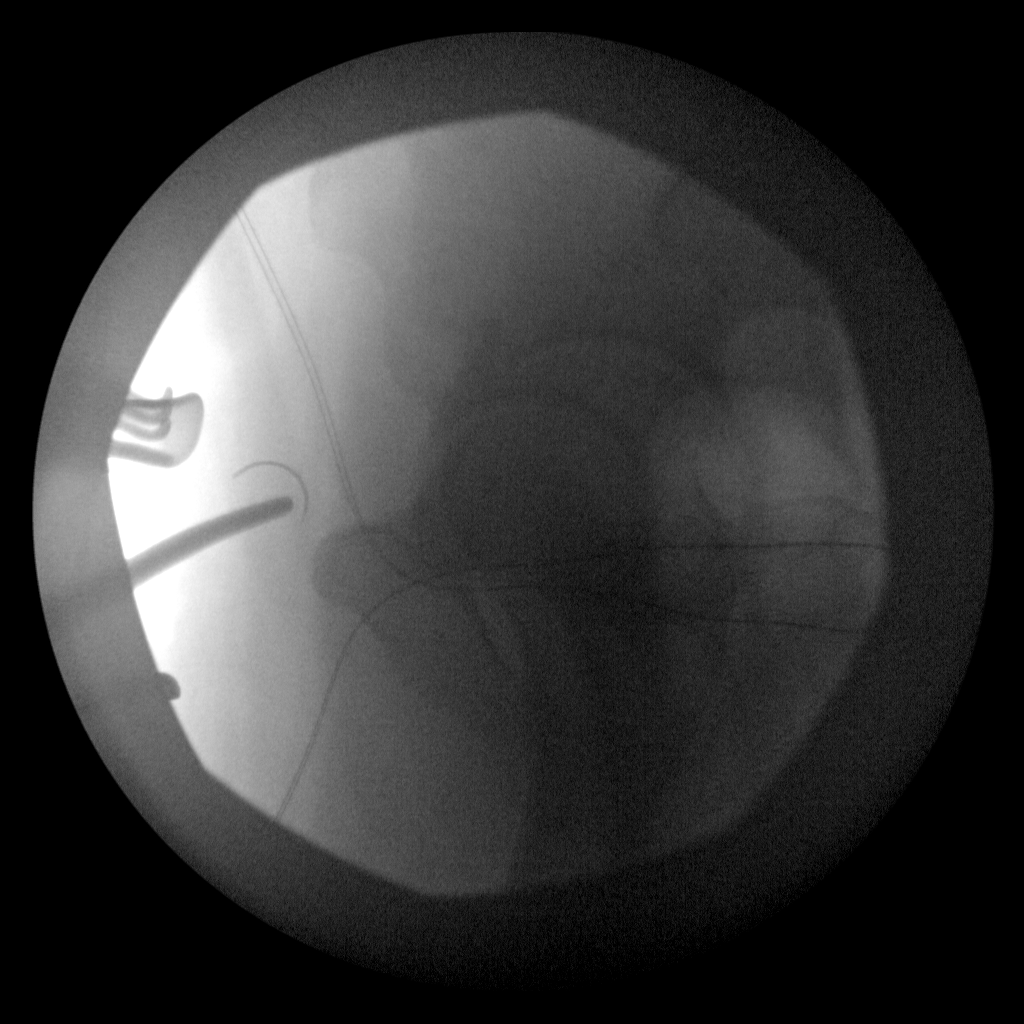
[im 2/4]
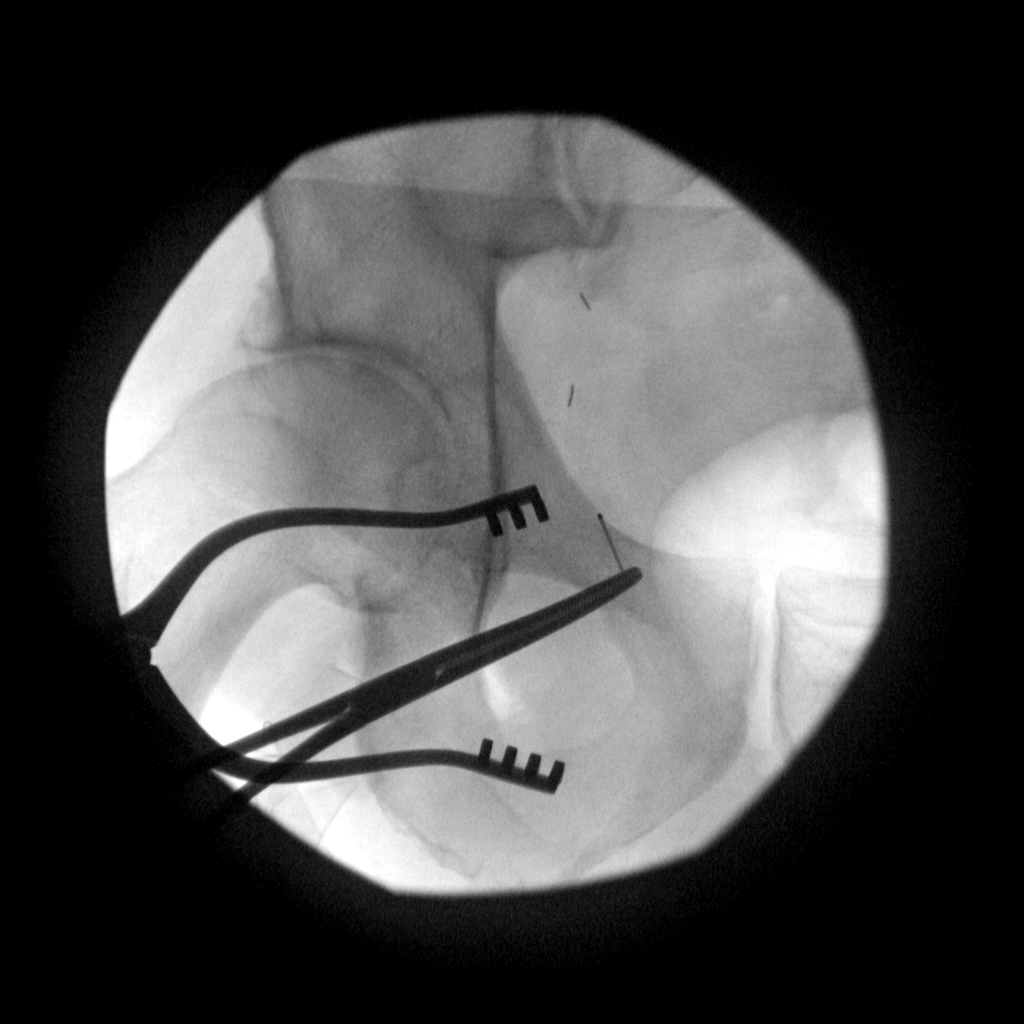
[im 3/4]
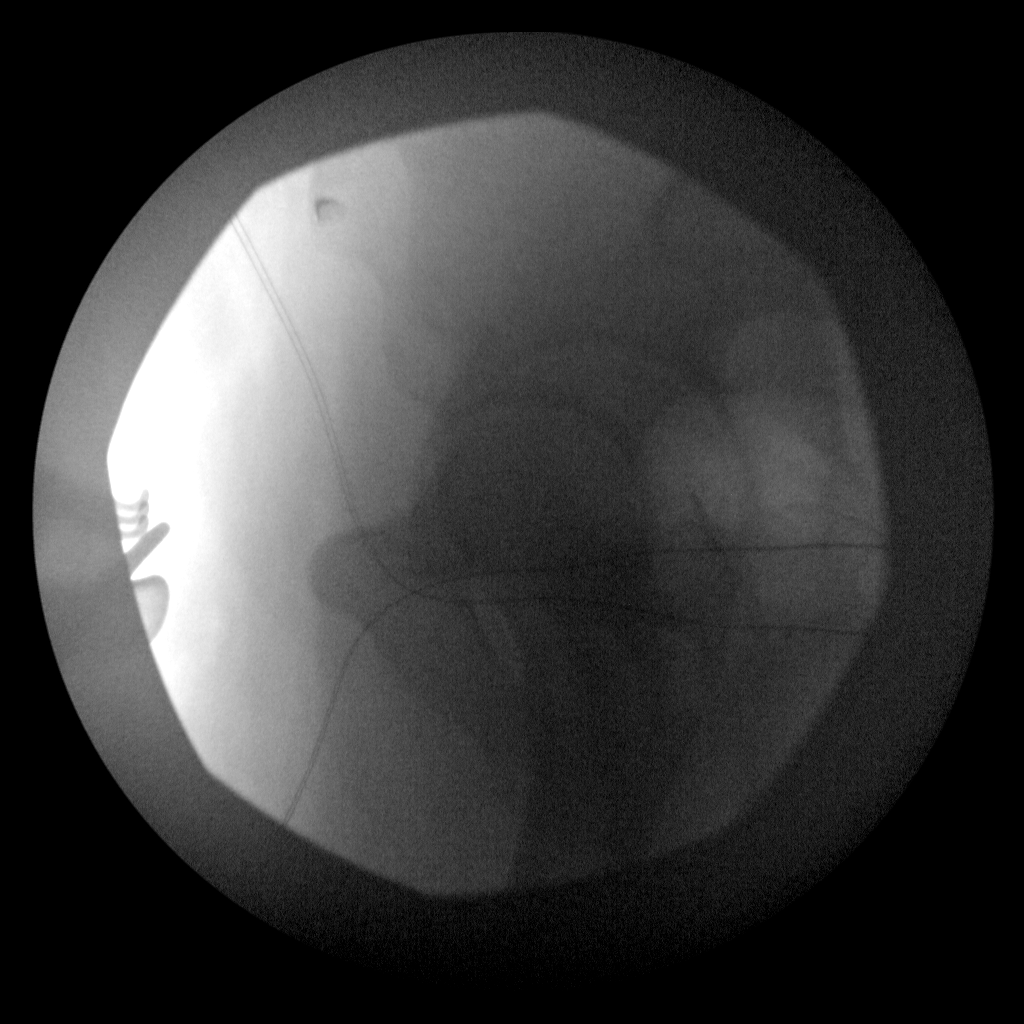
[im 4/4]
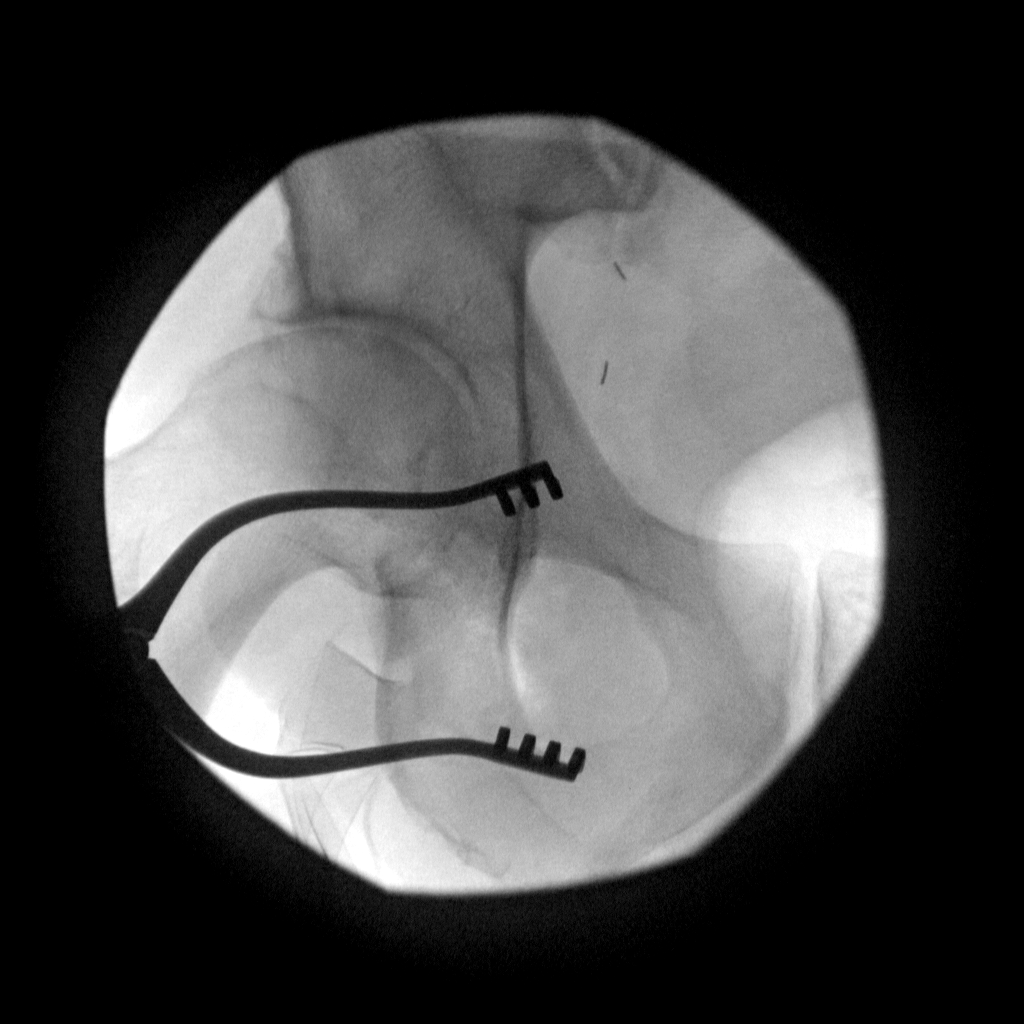

[4 of 4 positions shown; findings below may reference images not displayed]

FINDINGS: C-arm images of the right inguinal area were obtained. Surgical
instruments in the field. The large curved needle overlying the
superior pubic ramus on the right is identified by fluoroscopy and
appears attached to a surgical clamp. Final image reveals removal of
the needle.
IMPRESSION: Surgical needle in the right inguinal region is removed and
confirmed by fluoroscopic image.

## 2022-01-10 ENCOUNTER — Ambulatory Visit (INDEPENDENT_AMBULATORY_CARE_PROVIDER_SITE_OTHER): Payer: Managed Care, Other (non HMO) | Admitting: Neurology

## 2022-01-10 ENCOUNTER — Encounter: Payer: Self-pay | Admitting: Neurology

## 2022-01-10 VITALS — BP 120/75 | HR 74 | Ht 75.0 in | Wt 277.4 lb

## 2022-01-10 DIAGNOSIS — G479 Sleep disorder, unspecified: Secondary | ICD-10-CM | POA: Diagnosis not present

## 2022-01-10 DIAGNOSIS — G4733 Obstructive sleep apnea (adult) (pediatric): Secondary | ICD-10-CM

## 2022-01-10 DIAGNOSIS — M79604 Pain in right leg: Secondary | ICD-10-CM

## 2022-01-10 DIAGNOSIS — G20A2 Parkinson's disease without dyskinesia, with fluctuations: Secondary | ICD-10-CM | POA: Diagnosis not present

## 2022-01-10 NOTE — Patient Instructions (Signed)
Please continue using your autoPAP regularly. While your insurance requires that you use PAP at least 4 hours each night on 70% of the nights, I recommend, that you not skip any nights and use it throughout the night if you can. Getting used to PAP and staying with the treatment long term does take time and patience and discipline. Untreated obstructive sleep apnea when it is moderate to severe can have an adverse impact on cardiovascular health and raise her risk for heart disease, arrhythmias, hypertension, congestive heart failure, stroke and diabetes. Untreated obstructive sleep apnea causes sleep disruption, nonrestorative sleep, and sleep deprivation. This can have an impact on your day to day functioning and cause daytime sleepiness and impairment of cognitive function, memory loss, mood disturbance, and problems focussing. Using PAP regularly can improve these symptoms.   

## 2022-01-10 NOTE — Progress Notes (Signed)
Subjective:    Patient ID: [name] is a 66 y.o. male.  HPI    Interim history:   [name] is a 66 year old right-handed gentleman with an underlying history of hyperlipidemia and kidney stones, s/p DBS for PD, who presents for followup consultation of his right-sided predominant Parkinson's disease, and OSA on AutoPap therapy.  The patient is unaccompanied today. I last saw him on 07/11/2021, at which time he reported sciatica type symptoms.  He had right inguinal hernia surgery again in May 2023.  He had an emergency room visit in April 2023 for palpitations.  He was advised to continue with his medication regimen for Parkinson's disease.  He was advised to see orthopedics for his sciatica.  He saw Elbert Ewings, PA at [facility] neurology in the interim on 09/10/2021, at which time he reported lumbar radiculopathy type symptoms to the right.  The DBS settings on the left side were changed a little bit to see if it would help the right leg.  He was advised that his DBS was not MRI compatible in case he needed a scan for the lumbar spine.  Today, 01/10/2022: He reports ongoing issues with right leg pain, it is more in the front part of his leg and thigh.  He has seen Dr. Tonita Cong in orthopedics for this.  He had some form of scan but I could not see the report in his chart, he cannot have an MRI unfortunately.  He has had physical therapy and has also seen a chiropractor and works with the Architect.  His pain keeps him up at night, he has not used his AutoPap machine in the past several months, has tried here and there, has nocturia about 3 times per average night and does not wake up rested, he is willing to get back on his AutoPap machine and will try.    The patient's allergies, current medications, family history, past medical history, past social history, past surgical history and problem list were reviewed and updated as appropriate.    Previously (copied from previous  notes for reference):    I saw him on 11/08/2020 at which time he was on Sinemet to 1-1/2 pills 5 times a day. This was done in July '22, when he last saw Elbert Ewings at [facility]. He had a swallow study on 10/11/2020, which showed small penetration with thin liquids, trace to mild residuals with pudding, no frank aspiration. He also had an upper GI and HIDA scan.  He was having quite a bit of GI issues.  He retired in June 2022. His older son moved to [place]. He was using his AutoPap. He c/o right hip pain.  He will eventually need right foot surgery, he was planning to getting an opinion from Dr. Tonita Cong who came recommended.    I saw him on 03/13/2020, at which time he reported feeling fairly stable.  He was quite consistent with his AutoPap.  He was not quite ready for his second DBS, he had a follow-up with Dr. Linus Mako in January 2022.  He had noticed more restless legs type symptoms.  He was advised to increase his gabapentin to up to 300 mg at bedtime.  We decided to keep his Sinemet at 1-1/2 pills 4 times a day, Neupro at 4 mg once daily and Azilect 1 mg once daily.     I saw him on 08/23/2019, at which time he reported that the programming change recently had helped, he had a  recent appointment at [facility].  He was advised to continue with his Sinemet which was 1-1/2 pills 4 times a day although he had been advised to increase it to 5 times a day previously when he went to [facility].  He felt stable on the 4 times a day scheduling.  He was having more restless leg symptoms.  He was advised to start low-dose gabapentin 100 mg strength.    I reviewed the last 30 days, average usage was 5 hours and 20 minutes, date range was 02/12/2020 through 03/12/2020.  Average AHI 0.8/h, average pressure for the 95th percentile 8.3 cm, average leak for the 95th percentile 14.2 L/min.  Percent use days greater than 4 hours at 80% which is very good.         I saw him on 04/20/2019, at which time I suggested he  increase his Sinemet.       I saw him on 12/21/18, at which time he was compliant with his AutoPap.  He reported doing okay with it.  He had ongoing issues with lower extremity swelling.  He was advised to exercise on a more consistent basis.  He had interim problems with hernia and reported that he may need surgery.  He ended up having right hydronephrosis and required a stent placement on 04/14/2019.    He had an interim appointment with [facility] for programming on 12/30/2018 and a subsequent appointment with Dr. Deboraha Sprang on 01/20/2019 and I reviewed the office note.    I reviewed his AutoPap compliance data from 03/20/2019 through 04/18/2019 which is a total of 30 days, during which time he used his machine 27 days with percent use days greater than 4 hours at 83%, indicating very good compliance with an average usage of 5 hours and 35 minutes for days on treatment, residual AHI at goal at 0.5/h, 95th percentile pressure at 8.3 cm with a range of 7 cm to 13 cm with EPR, leak acceptable with a 95th percentile at 10.3 L/min.     I saw him in a virtual visit on 07/30/2018, at which time he was compliant with his AutoPap.  I suggested we continue with his medication regimen including Azilect 1 mg once daily, Sinemet 1-1/2 pills 4 times a day and Neupro patch 4 mg daily.     I reviewed his AutoPap compliance data from 11/17/2018 through 12/16/2018 which is a total of 30 days, during which time he used his machine 27 days with percent use days greater than 4 hours at 80%, indicating very good compliance with an average usage of 5 hours and 58 minutes, residual AHI at goal at 0.7/h, leak high with a 95th percentile at 29.5 L/min on a pressure range of 7 cm to 13 cm with EPR, 95th percentile of pressure at 8.4 cm.       I saw him on 11/06/2017, at which time he felt stable from the Parkinson's standpoint.  He had recently seen Dr. Linus Mako at [facility] in August and programming was kept the same.  His lower  extremity swelling had gotten worse.     He emailed in December and in early January 2020 we reduced his Neupro patch from 6 mg to 4 mg daily.   He saw Cecille Rubin, nurse practitioner in the interim on 03/31/2018 for his first AutoPap visit after his home sleep test and he was adequate with his AutoPap compliance at the time.    I reviewed his AutoPap compliance data  from 06/29/2018 through 07/28/2018 which is a total of 30 days, during which time he used his machine 26 days with percent used days greater than 4 hours at 77%, indicating good compliance with an average usage of 4 hours and 43 minutes only, residual AHI at goal at 1/h, 95th percentile of pressure at 8.3 cm, leak on the higher side with a 95th percentile at 18.3 L/min on a pressure range of 7 cm to 13 cm with EPR.    I saw him on 05/06/2017, at which time he was doing well, walking and tremor were since his first programming appointment on 04/18/2017. Of note, he had his left DBS surgery on 04/04/2017 under Dr. Salomon Fick. He has had a very good experience overall from beginning to end.      I saw him on 11/05/2016, at which time he reported no significant repercussions after reducing the Neupro and increasing the Sinemet. His swelling had improved a little bit. He was still in the process of being evaluated for DBS surgery. I suggested we keep his medication regimen the same. We talked about the importance of losing weight especially in preparation for elective surgery.     I saw him on 07/04/2016, at which time he reported doing okay, was planning to pursue DBS surgery perhaps later this year. He was quite apprehensive. He had not scheduled his psychological evaluation or surgical consultation. He was overall considered a good candidate for DBS treatment per neurology. He was more on time with his Sinemet and noticed improvement in his symptoms, lower extremity swelling was slightly worse. Neupro was working well at 8 mg, but d/t swelling,  I suggested with reduce it. I also suggested we increase his Sinemet to 1-1/2 pills for times a day in lieu of keeping the Neupro at 8 mg daily. He was no longer taking gabapentin and his foot pain was indeed a little bit better with time, gabapentin made him too sleepy.    I saw him on 03/05/2016, at which time he reported taking C/L 4 times a day, but not always all 4 doses, as he would forget at times. Low-dose gabapentin prn was helping his foot pain. No recent falls, no recent mood or memory issues, no word on the referral from [facility]. Had a recent cold and congestion. He was advised to continue with the current management, including Sinemet 1 pill 4 times a day, Azilect 1 mg once daily, Neupro patch 8 mg once daily, gabapentin 300 mg at night as needed.   I resubmitted referral to [facility] for DBS Eval and patient was seen in the interim by Dr. Linus Mako on 05/24/16. I reviewed the note.     I saw him on 12/04/2015, at which time he reported ongoing issues with his right foot including status post surgery in April 2017 for hammertoes but he had ongoing issues with pain and more stiffness. He felt overall that his right side was more stiff and tremulous. Since starting Sinemet he had noted no telltale difference in his symptoms. He had consulted with different podiatrists and had tried orthotics. Mood and memory were stable. He was not sleeping very well. We talked about DBS treatment in more detail. I made a referral to Dr. Linus Mako at [facility]. I also increased his Sinemet to 1 pill 4 times a day. I suggested we try him on low-dose gabapentin for his right foot pain.   I saw him on 08/01/2015, at which time he reported feeling worse. He had  more difficulty exercising, he had gained weight. He had foot surgery and his balance had been worse since then. He had hammertoe surgery on 05/25/2015. Because of decrease in mobility after his surgery his weight increase. His tremor was worse. Memory was stable. I  suggested we continue with Neupro 8 mg patch daily. I suggested he continue with Azilect once daily. I suggested we start him on the rytary 95 mg strength with gradual titration. However, he called in July reporting that the medication was too expensive, I therefore, switched him to Sinemet generic. He also requested a handicap placard form to be filled out which I provided in September.   Of note, he missed an appointment on 06/20/2015 due to being sick. I saw him on 12/01/2014, at which time he reported a change in his insurance, mood and memory were stable, lower extremity swelling was stable, we kept his Neupro patch at 8 mg an Azilect 1 mg once daily the same. He was working full-time, also active with his 2 teenage boys, 64 year old an 35 year old. His wife had a recent change in her job.    I saw him on 08/02/2014, at which time he reported doing well. He had some leg swelling. His right hand swelling was a little worse. Cognitively and mood wise he was stable. Balance may have been off at times but generally speaking he was doing well.    I saw him on 04/05/2014, at which time he reported feeling fairly stable but his wife felt his balance was not as good. He was working out with a Clinical research associate. He had picked up boxing. He was working full-time. He cut back on his sodas. I kept him on his medications, Neupro patch 6 mg and Azilect 1 mg. He had some ongoing issues with lower extremity swelling and I referred him to vascular surgery for consultation. He was seen by a vascular specialist, Dr. Scot Dock on 04/13/2014. He had Doppler studies to his lower extremities and was advised he had no DVT and good arterial flow. His swelling improved.    I saw him on 12/03/2013, at which time he reported doing well and had noted a benefit from Neupro patch 6 mg. I continued him on this dose as well as Azilect 1 mg once daily. His exam was stable.   I saw him on 07/28/2013, at which time he reported some worsening of his  tremor and his gait. He was able to tolerate neupro patch 4 mg strength. He has had some skin irritation, most likely from the adhesive. He was not exercising regularly but endorsed being active and working full-time. His memory was stable. He denies any impulse control disorder but had mild daytime somnolence which was not as severe as when he was on Requip long-acting.     I saw him on 02/05/2013, at which time I felt his physical exam was a little worse since stopping Requip XL. He had stopped this because of daytime somnolence and severe nausea. I suggested we start him on Neupro patch. I provided him with samples. We talked about doing a sleep study down the [place].     I first met him on 07/15/2012, at which time I suggested he start taking coenzyme Q 10. I also suggested that he switch his Requip XL 4 mg to nighttime because of report of daytime somnolence. I did not increase make any other changes to his medications and felt that he was overall stable.     He previously followed with  Dr. Jeneen Rinks love and was last seen by him on 03/17/2012 at which time Dr. Erling Cruz increase his Requip XL and continued him on Azilect. He was diagnosed in 12/2008, and Sx go back to a year prior to that.   MRI brain without contrast was done in the past. He has been on rasagiline, which improved his tremor. 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 There is no family history of tremor. There is no history of REM behavior disorder. He has had no falls, or hallucinations or involuntary movements otherwise. He exercises not very regularly. His memory and mood have been stable. He has no problems with lower extremities swelling or compulsive thoughts or gambling. He works full-time as a Engineer, site.     His Past Medical History Is Significant For: Past Medical History:  Diagnosis Date   History of kidney stones 04/14/2019   surgery to remove   Lesion of ulnar nerve    no  current problems   Parkinson's disease    Sleep apnea    occasional uses cpap - setting at 9    His Past Surgical History Is Significant For: Past Surgical History:  Procedure Laterality Date   COLONOSCOPY  2021   CYSTOSCOPY WITH RETROGRADE PYELOGRAM, URETEROSCOPY AND STENT PLACEMENT Right 04/14/2019   Procedure: CYSTOSCOPY WITH RETROGRADE PYELOGRAM, URETEROSCOPY AND STENT PLACEMENT;  Surgeon: Lucas Mallow, MD;  Location: [facility];  Service: Urology;  Laterality: Right;   DBS surgery   03/2017   x 2 surg for phase 1 and phase 2 - brain stimulator for parkinson's disease   HAMMERTOE RECONSTRUCTION WITH WEIL OSTEOTOMY Right 05/25/2015   Procedure: RIGHT SECOND TOE METATARSAL WEIL OSTEOTOMY  HAMMERTOE CORRECTION COLLATERAL LIGAMENT REPAIR; FLEXOR TO EXTENSOR TRANSFER ;  Surgeon: Wylene Simmer, MD;  Location: [place];  Service: Orthopedics;  Laterality: Right;   INGUINAL HERNIA REPAIR Right 12/21/2020   Procedure: REPAIR RIGHT INGUINAL HERNIA REPAIR WITH MESH;  Surgeon: Georganna Skeans, MD;  Location: [place];  Service: General;  Laterality: Right;   INSERTION OF MESH Right 12/21/2020   Procedure: INSERTION OF MESH;  Surgeon: Georganna Skeans, MD;  Location: [place];  Service: General;  Laterality: Right;   INSERTION OF MESH Right 06/19/2021   Procedure: INSERTION OF MESH;  Surgeon: Ralene Ok, MD;  Location: [place];  Service: General;  Laterality: Right;   kidney stones  1991   surgery to remove   XI ROBOTIC ASSISTED INGUINAL HERNIA REPAIR WITH MESH Right 06/19/2021   Procedure: XI ROBOTIC ASSISTED RIGHT INGUINAL HERNIA REPAIR WITH MESH;  Surgeon: Ralene Ok, MD;  Location: [place];  Service: General;  Laterality: Right;    His Family History Is Significant For: Family History  Problem Relation Age of Onset   Sleep apnea Mother    Heart failure Father    Cancer Father    Diabetes Father    Sleep apnea Brother    Parkinson's disease Neg Hx     His Social History  Is Significant For: Social History   Socioeconomic History   Marital status: Married    Spouse name: Juliann Pulse    Number of children: 2   Years of education: Not on file   Highest education level: Bachelor's degree (e.g., BA, AB, BS)  Occupational History   Not on file  Tobacco Use   Smoking status: Never   Smokeless tobacco: Never  Vaping Use   Vaping Use: Never used  Substance and Sexual Activity   Alcohol use: Yes    Comment: occasional   Drug use: No   Sexual activity: Not Currently  Other Topics Concern   Not on file  Social History Narrative   Consumes 1 soda a day    Right Handed   Social Determinants of Health   Financial Resource Strain: Not on file  Food Insecurity: Not on file  Transportation Needs: Not on file  Physical Activity: Not on file  Stress: Not on file  Social Connections: Not on file    His Allergies Are:  Allergies  Allergen Reactions   Ropinirole Hcl Other (See Comments) and Nausea Only  :   His Current Medications Are:  Outpatient Encounter Medications as of 01/10/2022  Medication Sig   calcium carbonate (TUMS EX) 750 MG chewable tablet Chew 750 mg by mouth daily as needed for heartburn.   carbidopa-levodopa (SINEMET IR) 25-100 MG tablet Take 1.5 tablets by mouth 4 (four) times daily. At 8, 12, 4 PM and 8 PM (Patient taking differently: Take 1.5 tablets by mouth 5 (five) times daily.)   rasagiline (AZILECT) 1 MG TABS tablet TAKE 1 TABLET BY MOUTH EVERY DAY   rotigotine (NEUPRO) 4 MG/24HR APPLY 1 PATCH ONTO THE SKIN EVERY DAY   No facility-administered encounter medications on file as of 01/10/2022.  :  Review of Systems:  Out of a complete 14 point review of systems, all are reviewed and negative with the exception of these symptoms as listed below:  Review of Systems  Neurological:        Pt is here for Follow Up. Pt states that he still has leg problems as before. Pt thinks disease is progressing. Pt states that he is moving slower  and legs get tired really quick. Pt states that he is getting up multiple time a night.   ESS:12    Objective:  Neurological Exam  Physical Exam Physical Examination:   Vitals:   01/10/22 1002  BP: 120/75  Pulse: 74    General Examination: The patient is a very pleasant 65 y.o. male in no acute distress. He appears well-developed and well-nourished and well groomed.   HEENT: Normocephalic, unremarkable scar left parietal scalp from DBS. Pupils are equal, round and reactive to light, extraocular tracking shows moderate saccadic breakdown without nystagmus noted. There is no limitation to his gaze. There is mild to moderate decrease in eye blink rate. Hearing is intact. Face is symmetric with moderate facial masking, no lip, neck or jaw tremor. Oropharynx exam reveals moderate airway crowding. There is no sialorrhea, he has mild hypophonia, no dysarthria.   Chest: is clear to auscultation without wheezing, rhonchi or crackles noted.   Heart: sounds are regular and normal without murmurs, rubs or gallops noted.    Abdomen: is soft, non-tender and non-distended.   Extremities: There trace to 1+ pitting edema in the distal lower extremities bilaterally.   Skin: is warm and dry with no trophic changes noted. Chronic swelling type changes and mild redness in both distal lower extremities.   Musculoskeletal: exam reveals right leg pain, decreased range of motion of the right leg and foot.   Neurologically:  Mental status: The patient is awake and alert, paying good attention. He is able to completely provide the history. He is oriented to: person, place, time/date, situation, day of week, month of year and year. His memory, attention, language and knowledge are intact. There is no aphasia, agnosia, apraxia or anomia. There is perhaps  mild bradyphrenia. Speech is moderately hypophonic with no dysarthria noted. Mood is congruent and affect is blunted.    Cranial nerves are as described  above under HEENT exam.    Motor exam: Normal bulk, and strength for age is noted. Tone is mild to moderately rigid with presence of cogwheeling in the right upper extremity and RLE. There is overall moderate bradykinesia. There is no drift or rebound.  There is an intermittent mild resting tremor in the LUE, stable.  No obvious dyskinesias.    Fine motor skills exam:  moderate impairment noted on the right, mild to moderate impairment on the left, stable.   Cerebellar testing shows no dysmetria or intention tremor. There is no truncal or gait ataxia.    Sensory exam is intact to light touch in both upper and lower extremities.   Gait, station and balance: He stands up from the seated position with no significant difficulty but does need to push up with his hands. He needs no assistance. No veering to one side is noted. Posture mildly stooped for age, slightly worse, walks slowly and with a limp on the right.  No walking gait. Balance fairly well preserved.    Assessment and Plan:    In summary, [name] is a very pleasant 66 year old male with an underlying history of hyperlipidemia and kidney stones, s/p L DBS for PD, who presents for followup consultation of his right-sided predominant Parkinson's disease, complicated by right foot pain and right leg pain, and sleep disturbance including sleep apnea which is currently not treated. He was diagnosed with Parkinson's disease in 2010, symptoms date back to 2009. He had no telltale history of RBD, had some vivid dreams in the past. He is status post left STN DBS placement in February 2019, and has done quite well after that.  He has been on a DBS ambassador for other patients who are considering DBS surgery at [facility].  He continues to be on Neupro patch, 4 mg strength. We reduced the patch to 6 mg strength in May 2018 for lower extremity swelling. I suggested we reduce his Neupro patch further to 4 mg in early 2020. His home sleep test from  12/23/2017 showed severe obstructive sleep apnea with an AHI of 46.9/h, O2 nadir of 84%.  He established treatment on AutoPap and had been compliant in the past but currently not using his machine.  He is encouraged to try to get back on track with the AutoPap.  Sleep apnea improvement and better sleep may result in less severe pain, and less sleep disruption, less nocturia. He has regular follow-up at [facility].  He is taking Sinemet 1-1/2 pills 5 times a day currently.  He tried gabapentin for restless leg symptoms at night but is not consistently using this.  He is advised to make a follow-up appointment with his orthopedic specialist.  While he cannot have an MRI of the lumbar spine, he may be a candidate for a CT myelogram.  He is advised to follow-up with one of our nurse practitioners for sleep apnea recheck in about 3 to 4 months via virtual visit.  Is advised to follow-up with me after he has had his follow-up at [facility].  This is scheduled for May 2024, I can see him after that.  We mutually actually agreed to keep his medication regimen the same. I answered all his questions today and he was in agreement with the plan. I spent 30 minutes in total  face-to-face time and in reviewing records during pre-charting, more than 50% of which was spent in counseling and coordination of care, reviewing test results, reviewing medications and treatment regimen and/or in discussing or reviewing the diagnosis of PD, OSA, the prognosis and treatment options. Pertinent laboratory and imaging test results that were available during this visit with the patient were reviewed by me and considered in my medical decision making (see chart for details).

## 2022-01-18 ENCOUNTER — Other Ambulatory Visit: Payer: Self-pay | Admitting: Specialist

## 2022-01-18 DIAGNOSIS — M5451 Vertebrogenic low back pain: Secondary | ICD-10-CM

## 2022-02-01 ENCOUNTER — Encounter: Payer: Self-pay | Admitting: Radiology

## 2022-02-07 ENCOUNTER — Other Ambulatory Visit: Payer: Managed Care, Other (non HMO)

## 2022-02-15 ENCOUNTER — Ambulatory Visit
Admission: RE | Admit: 2022-02-15 | Discharge: 2022-02-15 | Disposition: A | Discharge status: Home / Self Care | Payer: Managed Care, Other (non HMO) | Source: Ambulatory Visit | Attending: Specialist | Admitting: Specialist

## 2022-02-15 DIAGNOSIS — M5451 Vertebrogenic low back pain: Secondary | ICD-10-CM

## 2022-02-26 ENCOUNTER — Other Ambulatory Visit: Payer: Managed Care, Other (non HMO)

## 2022-03-11 ENCOUNTER — Encounter: Payer: Self-pay | Admitting: Neurology

## 2022-04-26 ENCOUNTER — Telehealth: Payer: Managed Care, Other (non HMO) | Admitting: Adult Health

## 2022-07-16 ENCOUNTER — Telehealth: Payer: Self-pay | Admitting: *Deleted

## 2022-07-16 NOTE — Telephone Encounter (Signed)
Having Parkinson's disease is not a contraindication for surgery. However, we have to be aware, that patients with Parkinson's disease can have a longer recovery time and some setback with their Parkinson's symptoms, typically linked to irregularities to their medication timing and also due to having to take pain medication and secondary to temporarily being more immobile.   Please advise pt to discontinue Azilect 1 mg strength by reducing to half pill x 1 week and stop completely 1 to 2 weeks before the elective surgery.   Azilect has been reported in some case reports to interact with anesthesia pharmaceuticals as well as meperidine and narcotic pain medications. It can enhance adverse effects of hydrocodone, oxycodone, oxymorphone, hydromorphone, etc. [name] can resume the Azilect 3 days after surgery - so long as [name] is no longer on any narcotic pain medication - [name] can restart by taking half a pill once daily x 1 week and then 1 mg once daily thereafter.  Generally speaking, patients with Parkinson's disease can have perioperative difficulty with motor fluctuations (ie, increase in tremor, slowness, stiffness), due to changes in their medication regimen, changes in the schedule altogether, and swallowing difficulty and be at higher risk for freezing, of times, balance issues and falls, and aspiration. Parkinson's patients can also be at higher risk of developing hallucinations secondary to pain medication, such as after elective surgery, requiring postoperative narcotic pain medication.

## 2022-07-16 NOTE — Telephone Encounter (Signed)
Received fax for pt from [facility].  For preop risk assessment. Pt last seen 01-10-2022, next appt is 10-17-2022.  He did see [facility] for PD 06-17-2022.  Complete?

## 2022-07-17 NOTE — Telephone Encounter (Signed)
Form placed in Dr Teofilo Pod office for signature and to mark options for level of risk. I printed this phone note to include with the clearance form.

## 2022-07-18 NOTE — Telephone Encounter (Signed)
I called pt and relayed that per Dr. Frances Furbish, she has the preop risk assessment sheet to address.  But she did make mention of message about PD and surgery and what to do with Azilect when has surgery in the mychart portal.   Pt will read on mychart and respond back to [place] if as questions.  He appreciated the call.

## 2022-08-16 ENCOUNTER — Encounter: Payer: Self-pay | Admitting: Neurology

## 2022-08-19 MED ORDER — NEUPRO 4 MG/24HR TD PT24: MEDICATED_PATCH | TRANSDERMAL | 0 refills | Status: DC

## 2022-09-06 NOTE — Progress Notes (Signed)
Sent message, via epic in basket, requesting orders in epic from surgeon.  

## 2022-09-11 NOTE — Progress Notes (Addendum)
COVID Vaccine Completed:  Date of COVID positive in last 90 days:  No  PCP - Peri Maris, FNP Cardiologist -  Neurologist - Huston Foley, MD/Jennifer Lucretia Roers, PA-C  Chest x-ray - N/A EKG - June 2024 at PCP Stress Test - Yes in the past ECHO - N/A Cardiac Cath - N/A Pacemaker/ICD device last checked: Spinal Cord Stimulator: Deep brain stimulator .  Patient will bring remote day of surgery   Bowel Prep - N/A  Sleep Study - +sleep apnea CPAP - Does not use any longer, stopped using 2+ months ago  Fasting Blood Sugar - N/A Checks Blood Sugar _____ times a day  Last dose of GLP1 agonist-  N/A GLP1 instructions:  N/A   Last dose of SGLT-2 inhibitors-  N/A SGLT-2 instructions: N/A  Blood Thinner Instructions:N/A Aspirin Instructions: Last Dose:  Activity level:  Can go up a flight of stairs and perform activities of daily living without stopping and without symptoms of chest pain or shortness of breath.  Anesthesia review:  Parkinson's with deep brain stimulator  Patient denies shortness of breath, fever, cough and chest pain at PAT appointment  Patient verbalized understanding of instructions that were given to them at the PAT appointment. Patient was also instructed that they will need to review over the PAT instructions again at home before surgery.

## 2022-09-11 NOTE — Patient Instructions (Addendum)
SURGICAL WAITING ROOM VISITATION Patients having surgery or a procedure may have no more than 2 support people in the waiting area - these visitors may rotate.    Children under the age of 59 must have an adult with them who is not the patient.  If the patient needs to stay at the hospital during part of their recovery, the visitor guidelines for inpatient rooms apply. Pre-op nurse will coordinate an appropriate time for 1 support person to accompany patient in pre-op.  This support person may not rotate.    Please refer to the [facility] website for the visitor guidelines for Inpatients (after your surgery is over and you are in a regular room).       Your procedure is scheduled on: 09-25-22   Report to [facility] Main Entrance    Report to admitting at 12:00 PM   Call this number if you have problems the morning of surgery [phone]   Do not eat food :After Midnight.   After Midnight you may have the following liquids until 11:30 AM DAY OF SURGERY  Water Non-Citrus Juices (without pulp, NO RED-Apple, White grape, White cranberry) Black Coffee (NO MILK/CREAM OR CREAMERS, sugar ok)  Clear Tea (NO MILK/CREAM OR CREAMERS, sugar ok) regular and decaf                             Plain Jell-O (NO RED)                                           Fruit ices (not with fruit pulp, NO RED)                                     Popsicles (NO RED)                                                               Sports drinks like Gatorade (NO RED)                   The day of surgery:  Drink ONE (1) Pre-Surgery Clear Ensure at 11:30 AM the morning of surgery. Drink in one sitting. Do not sip.  This drink was given to you during your hospital  pre-op appointment visit. Nothing else to drink after completing the Pre-Surgery Clear Ensure.          If you have questions, please contact your surgeon's office.   FOLLOW  ANY ADDITIONAL PRE OP INSTRUCTIONS YOU RECEIVED FROM YOUR  SURGEON'S OFFICE!!!     Oral Hygiene is also important to reduce your risk of infection.                                    Remember - BRUSH YOUR TEETH THE MORNING OF SURGERY WITH YOUR REGULAR TOOTHPASTE   Do NOT smoke after Midnight   Take these medicines the morning of surgery with A SIP OF WATER:   Carbidopa-Levodopa  Rasagiline  Stop  all vitamins and herbal supplements 7 days before surgery                              You may not have any metal on your body including jewelry, and body piercing             Do not wear  lotions, powders, cologne, or deodorant              Men may shave face and neck.   Do not bring valuables to the hospital. [facility] IS NOT RESPONSIBLE   FOR VALUABLES.   Contacts, dentures or bridgework may not be worn into surgery.   Bring small overnight bag day of surgery.   DO NOT BRING YOUR HOME MEDICATIONS TO THE HOSPITAL. PHARMACY WILL DISPENSE MEDICATIONS LISTED ON YOUR MEDICATION LIST TO YOU DURING YOUR ADMISSION IN THE HOSPITAL!   Special Instructions: Bring a copy of your healthcare power of attorney and living will documents the day of surgery if you haven't scanned them before.              Please read over the following fact sheets you were given: IF YOU HAVE QUESTIONS ABOUT YOUR PRE-OP INSTRUCTIONS PLEASE CALL [phone] Gwen  If you received a COVID test during your pre-op visit  it is requested that you wear a mask when out in public, stay away from anyone that may not be feeling well and notify your surgeon if you develop symptoms. If you test positive for Covid or have been in contact with anyone that has tested positive in the last 10 days please notify you surgeon.    Pre-operative 5 CHG Bath Instructions   You can play a key role in reducing the risk of infection after surgery. Your skin needs to be as free of germs as possible. You can reduce the number of germs on your skin by washing with CHG (chlorhexidine gluconate) soap  before surgery. CHG is an antiseptic soap that kills germs and continues to kill germs even after washing.   DO NOT use if you have an allergy to chlorhexidine/CHG or antibacterial soaps. If your skin becomes reddened or irritated, stop using the CHG and notify one of our RNs at  [phone] .   Please shower with the CHG soap starting 4 days before surgery using the following schedule:     Please keep in mind the following:  DO NOT shave, including legs and underarms, starting the day of your first shower.   You may shave your face at any point before/day of surgery.  Place clean sheets on your bed the day you start using CHG soap. Use a clean washcloth (not used since being washed) for each shower. DO NOT sleep with pets once you start using the CHG.   CHG Shower Instructions:  If you choose to wash your hair and private area, wash first with your normal shampoo/soap.  After you use shampoo/soap, rinse your hair and body thoroughly to remove shampoo/soap residue.  Turn the water OFF and apply about 3 tablespoons (45 ml) of CHG soap to a CLEAN washcloth.  Apply CHG soap ONLY FROM YOUR NECK DOWN TO YOUR TOES (washing for 3-5 minutes)  DO NOT use CHG soap on face, private areas, open wounds, or sores.  Pay special attention to the area where your surgery is being performed.  If you are having back surgery, having someone wash your back for  you may be helpful. Wait 2 minutes after CHG soap is applied, then you may rinse off the CHG soap.  Pat dry with a clean towel  Put on clean clothes/pajamas   If you choose to wear lotion, please use ONLY the CHG-compatible lotions on the back of this paper.     Additional instructions for the day of surgery: DO NOT APPLY any lotions, deodorants, cologne, or perfumes.   Put on clean/comfortable clothes.  Brush your teeth.  Ask your nurse before applying any prescription medications to the skin.      CHG Compatible Lotions   Aveeno  Moisturizing lotion  Cetaphil Moisturizing Cream  Cetaphil Moisturizing Lotion  Clairol Herbal Essence Moisturizing Lotion, Dry Skin  Clairol Herbal Essence Moisturizing Lotion, Extra Dry Skin  Clairol Herbal Essence Moisturizing Lotion, Normal Skin  Curel Age Defying Therapeutic Moisturizing Lotion with Alpha Hydroxy  Curel Extreme Care Body Lotion  Curel Soothing Hands Moisturizing Hand Lotion  Curel Therapeutic Moisturizing Cream, Fragrance-Free  Curel Therapeutic Moisturizing Lotion, Fragrance-Free  Curel Therapeutic Moisturizing Lotion, Original Formula  Eucerin Daily Replenishing Lotion  Eucerin Dry Skin Therapy Plus Alpha Hydroxy Crme  Eucerin Dry Skin Therapy Plus Alpha Hydroxy Lotion  Eucerin Original Crme  Eucerin Original Lotion  Eucerin Plus Crme Eucerin Plus Lotion  Eucerin TriLipid Replenishing Lotion  Keri Anti-Bacterial Hand Lotion  Keri Deep Conditioning Original Lotion Dry Skin Formula Softly Scented  Keri Deep Conditioning Original Lotion, Fragrance Free Sensitive Skin Formula  Keri Lotion Fast Absorbing Fragrance Free Sensitive Skin Formula  Keri Lotion Fast Absorbing Softly Scented Dry Skin Formula  Keri Original Lotion  Keri Skin Renewal Lotion Keri Silky Smooth Lotion  Keri Silky Smooth Sensitive Skin Lotion  Nivea Body Creamy Conditioning Oil  Nivea Body Extra Enriched Lotion  Nivea Body Original Lotion  Nivea Body Sheer Moisturizing Lotion Nivea Crme  Nivea Skin Firming Lotion  NutraDerm 30 Skin Lotion  NutraDerm Skin Lotion  NutraDerm Therapeutic Skin Cream  NutraDerm Therapeutic Skin Lotion  ProShield Protective Hand Cream  Provon moisturizing lotion   PATIENT SIGNATURE_________________________________  NURSE SIGNATURE__________________________________  ________________________________________________________________________    [name]  An incentive spirometer is a tool that can help keep your lungs clear and active. This  tool measures how well you are filling your lungs with each breath. Taking long deep breaths may help reverse or decrease the chance of developing breathing (pulmonary) problems (especially infection) following: A long period of time when you are unable to move or be active. BEFORE THE PROCEDURE  If the spirometer includes an indicator to show your best effort, your nurse or respiratory therapist will set it to a desired goal. If possible, sit up straight or lean slightly forward. Try not to slouch. Hold the incentive spirometer in an upright position. INSTRUCTIONS FOR USE  Sit on the edge of your bed if possible, or sit up as far as you can in bed or on a chair. Hold the incentive spirometer in an upright position. Breathe out normally. Place the mouthpiece in your mouth and seal your lips tightly around it. Breathe in slowly and as deeply as possible, raising the piston or the ball toward the top of the column. Hold your breath for 3-5 seconds or for as long as possible. Allow the piston or ball to fall to the bottom of the column. Remove the mouthpiece from your mouth and breathe out normally. Rest for a few seconds and repeat Steps 1 through 7 at least 10 times every 1-2 hours  when you are awake. Take your time and take a few normal breaths between deep breaths. The spirometer may include an indicator to show your best effort. Use the indicator as a goal to work toward during each repetition. After each set of 10 deep breaths, practice coughing to be sure your lungs are clear. If you have an incision (the cut made at the time of surgery), support your incision when coughing by placing a pillow or rolled up towels firmly against it. Once you are able to get out of bed, walk around indoors and cough well. You may stop using the incentive spirometer when instructed by your caregiver.  RISKS AND COMPLICATIONS Take your time so you do not get dizzy or light-headed. If you are in pain, you may need  to take or ask for pain medication before doing incentive spirometry. It is harder to take a deep breath if you are having pain. AFTER USE Rest and breathe slowly and easily. It can be helpful to keep track of a log of your progress. Your caregiver can provide you with a simple table to help with this. If you are using the spirometer at home, follow these instructions: SEEK MEDICAL CARE IF:  You are having difficultly using the spirometer. You have trouble using the spirometer as often as instructed. Your pain medication is not giving enough relief while using the spirometer. You develop fever of 100.5 F (38.1 C) or higher. SEEK IMMEDIATE MEDICAL CARE IF:  You cough up bloody sputum that had not been present before. You develop fever of 102 F (38.9 C) or greater. You develop worsening pain at or near the incision site. MAKE SURE YOU:  Understand these instructions. Will watch your condition. Will get help right away if you are not doing well or get worse. Document Released: 06/10/2006 Document Revised: 04/22/2011 Document Reviewed: 08/11/2006 Exit[employer] Patient Information 2014 Exit[employer], [place].   ________________________________________________________________________ WHAT IS A BLOOD TRANSFUSION? Blood Transfusion Information  A transfusion is the replacement of blood or some of its parts. Blood is made up of multiple cells which provide different functions. Red blood cells carry oxygen and are used for blood loss replacement. White blood cells fight against infection. Platelets control bleeding. Plasma helps clot blood. Other blood products are available for specialized needs, such as hemophilia or other clotting disorders. BEFORE THE TRANSFUSION  Who gives blood for transfusions?  Healthy volunteers who are fully evaluated to make sure their blood is safe. This is blood bank blood. Transfusion therapy is the safest it has ever been in the practice of medicine. Before blood is  taken from a donor, a complete history is taken to make sure that person has no history of diseases nor engages in risky social behavior (examples are intravenous drug use or sexual activity with multiple partners). The donor's travel history is screened to minimize risk of transmitting infections, such as malaria. The donated blood is tested for signs of infectious diseases, such as HIV and hepatitis. The blood is then tested to be sure it is compatible with you in order to minimize the chance of a transfusion reaction. If you or a relative donates blood, this is often done in anticipation of surgery and is not appropriate for emergency situations. It takes many days to process the donated blood. RISKS AND COMPLICATIONS Although transfusion therapy is very safe and saves many lives, the main dangers of transfusion include:  Getting an infectious disease. Developing a transfusion reaction. This is an allergic reaction to  something in the blood you were given. Every precaution is taken to prevent this. The decision to have a blood transfusion has been considered carefully by your caregiver before blood is given. Blood is not given unless the benefits outweigh the risks. AFTER THE TRANSFUSION Right after receiving a blood transfusion, you will usually feel much better and more energetic. This is especially true if your red blood cells have gotten low (anemic). The transfusion raises the level of the red blood cells which carry oxygen, and this usually causes an energy increase. The nurse administering the transfusion will monitor you carefully for complications. HOME CARE INSTRUCTIONS  No special instructions are needed after a transfusion. You may find your energy is better. Speak with your caregiver about any limitations on activity for underlying diseases you may have. SEEK MEDICAL CARE IF:  Your condition is not improving after your transfusion. You develop redness or irritation at the intravenous  (IV) site. SEEK IMMEDIATE MEDICAL CARE IF:  Any of the following symptoms occur over the next 12 hours: Shaking chills. You have a temperature by mouth above 102 F (38.9 C), not controlled by medicine. Chest, back, or muscle pain. People around you feel you are not acting correctly or are confused. Shortness of breath or difficulty breathing. Dizziness and fainting. You get a rash or develop hives. You have a decrease in urine output. Your urine turns a dark color or changes to pink, red, or brown. Any of the following symptoms occur over the next 10 days: You have a temperature by mouth above 102 F (38.9 C), not controlled by medicine. Shortness of breath. Weakness after normal activity. The white part of the eye turns yellow (jaundice). You have a decrease in the amount of urine or are urinating less often. Your urine turns a dark color or changes to pink, red, or brown. Document Released: 01/26/2000 Document Revised: 04/22/2011 Document Reviewed: 09/14/2007 [facility] Patient Information 2014 [place], [place].  _______________________________________________________________________

## 2022-09-12 ENCOUNTER — Ambulatory Visit: Payer: Self-pay | Admitting: Student

## 2022-09-13 ENCOUNTER — Encounter (HOSPITAL_COMMUNITY)
Admission: RE | Admit: 2022-09-13 | Discharge: 2022-09-13 | Disposition: A | Discharge status: Home / Self Care | Payer: Managed Care, Other (non HMO) | Source: Ambulatory Visit | Attending: Orthopedic Surgery | Admitting: Orthopedic Surgery

## 2022-09-13 ENCOUNTER — Encounter (HOSPITAL_COMMUNITY): Payer: Self-pay

## 2022-09-13 ENCOUNTER — Other Ambulatory Visit: Payer: Self-pay

## 2022-09-13 VITALS — BP 146/90 | HR 78 | Temp 98.1°F | Resp 20 | Ht 74.5 in | Wt 265.0 lb

## 2022-09-13 DIAGNOSIS — Z01812 Encounter for preprocedural laboratory examination: Secondary | ICD-10-CM | POA: Diagnosis not present

## 2022-09-13 DIAGNOSIS — G988 Other disorders of nervous system: Secondary | ICD-10-CM | POA: Insufficient documentation

## 2022-09-13 DIAGNOSIS — Z01818 Encounter for other preprocedural examination: Secondary | ICD-10-CM

## 2022-09-13 HISTORY — DX: Unspecified osteoarthritis, unspecified site: M19.90

## 2022-09-13 LAB — BASIC METABOLIC PANEL
Anion gap: 8 (ref 5–15)
BUN: 26 mg/dL — ABNORMAL HIGH (ref 8–23)
CO2: 27 mmol/L (ref 22–32)
Calcium: 9.2 mg/dL (ref 8.9–10.3)
Chloride: 102 mmol/L (ref 98–111)
Creatinine, Ser: 0.74 mg/dL (ref 0.61–1.24)
GFR, Estimated: >60 mL/min (ref >60)
Glucose, Bld: 86 mg/dL (ref 70–99)
Potassium: 3.9 mmol/L (ref 3.5–5.1)
Sodium: 137 mmol/L (ref 135–145)

## 2022-09-13 LAB — CBC
HCT: 45.5 % (ref 39.0–52.0)
Hemoglobin: 14.8 g/dL (ref 13.0–17.0)
MCH: 27.4 pg (ref 26.0–34.0)
MCHC: 32.5 g/dL (ref 30.0–36.0)
MCV: 84.3 fL (ref 80.0–100.0)
Platelets: 215 10*3/uL (ref 150–400)
RBC: 5.4 MIL/uL (ref 4.22–5.81)
RDW: 13.7 % (ref 11.5–15.5)
WBC: 8.5 10*3/uL (ref 4.0–10.5)
nRBC: 0 % (ref 0.0–0.2)

## 2022-09-13 LAB — TYPE AND SCREEN
ABO/RH(D): O POS
Antibody Screen: NEGATIVE

## 2022-09-13 LAB — SURGICAL PCR SCREEN
MRSA, PCR: NEGATIVE
Staphylococcus aureus: NEGATIVE

## 2022-09-19 ENCOUNTER — Encounter (HOSPITAL_COMMUNITY): Payer: Self-pay

## 2022-09-19 NOTE — Progress Notes (Signed)
Case: [id number] Date/Time: 09/25/22 1415   Procedure: TOTAL HIP ARTHROPLASTY ANTERIOR APPROACH (Right: Hip) - 130   Anesthesia type: Spinal   Pre-op diagnosis: Right hip osteoarthrtiis   [facility] ROOM 07 / [facility]   Surgeons: Samson Frederic, MD       DISCUSSION: [name] is a 67 yo male who presents to PAT prior to surgery above. PMH significant for Parkinsons disease s/p deep brain stimulator placement (03/2017), OSA (uses CPAP).  No prior anesthesia complications.  Patient follows with Huston Foley with neurology. Last seen in clinic on 01/10/22. Neurology clearance was requested by surgeon's office and recommendations from Dr. Frances Furbish are as follows in a telephone note dated 07/16/22:  "Having Parkinson's disease is not a contraindication for surgery. However, we have to be aware, that patients with Parkinson's disease can have a longer recovery time and some setback with their Parkinson's symptoms, typically linked to irregularities to their medication timing and also due to having to take pain medication and secondary to temporarily being more immobile.    Please advise pt to discontinue Azilect 1 mg strength by reducing to half pill x 1 week and stop completely 1 to 2 weeks before the elective surgery.   Azilect has been reported in some case reports to interact with anesthesia pharmaceuticals as well as meperidine and narcotic pain medications. It can enhance adverse effects of hydrocodone, oxycodone, oxymorphone, hydromorphone, etc. He can resume the Azilect 3 days after surgery - so long as he is no longer on any narcotic pain medication - he can restart by taking half a pill once daily x 1 week and then 1 mg once daily thereafter.   Generally speaking, patients with Parkinson's disease can have perioperative difficulty with motor fluctuations (ie, increase in tremor, slowness, stiffness), due to changes in their medication regimen, changes in the schedule altogether, and swallowing  difficulty and be at higher risk for freezing, of times, balance issues and falls, and aspiration. Parkinson's patients can also be at higher risk of developing hallucinations secondary to pain medication, such as after elective surgery, requiring postoperative narcotic pain medication."   VS: BP (!) 146/90   Pulse 78   Temp 36.7 C (Oral)   Resp 20   Ht 6' 2.5" (1.892 m)   Wt 120.2 kg   SpO2 99%   BMI 33.57 kg/m   PROVIDERS: Soundra Pilon, FNP Neurology: Huston Foley, MD  LABS: Labs reviewed: Acceptable for surgery. (all labs ordered are listed, but only abnormal results are displayed)  Labs Reviewed  BASIC METABOLIC PANEL - Abnormal; Notable for the following components:      Result Value   BUN 26 (*)    All other components within normal limits  SURGICAL PCR SCREEN  CBC  TYPE AND SCREEN     IMAGES:   EKG:   CV:  Past Medical History:  Diagnosis Date   Arthritis    History of kidney stones 04/14/2019   surgery to remove   Lesion of ulnar nerve    no current problems   Parkinson's disease    S/P deep brain stimulator    [employer] [phone], nondirectional lead   Sleep apnea    occasional uses cpap - setting at 7    Past Surgical History:  Procedure Laterality Date   COLONOSCOPY  2021   CYSTOSCOPY WITH RETROGRADE PYELOGRAM, URETEROSCOPY AND STENT PLACEMENT Right 04/14/2019   Procedure: CYSTOSCOPY WITH RETROGRADE PYELOGRAM, URETEROSCOPY AND STENT PLACEMENT;  Surgeon: Ray Church  III, MD;  [facility];  Service: Urology;  Laterality: Right;   DBS surgery   03/2017   x 2 surg for phase 1 and phase 2 - brain stimulator for parkinson's disease   HAMMERTOE RECONSTRUCTION WITH WEIL OSTEOTOMY Right 05/25/2015   Procedure: RIGHT SECOND TOE METATARSAL WEIL OSTEOTOMY  HAMMERTOE CORRECTION COLLATERAL LIGAMENT REPAIR; FLEXOR TO EXTENSOR TRANSFER ;  Surgeon: Toni Arthurs, MD;  Location: [facility];  Service: Orthopedics;  Laterality: Right;    INGUINAL HERNIA REPAIR Right 12/21/2020   Procedure: REPAIR RIGHT INGUINAL HERNIA REPAIR WITH MESH;  Surgeon: Violeta Gelinas, MD;  Location: [facility] OR;  Service: General;  Laterality: Right;   INSERTION OF MESH Right 12/21/2020   Procedure: INSERTION OF MESH;  Surgeon: Violeta Gelinas, MD;  Location: [facility] OR;  Service: General;  Laterality: Right;   INSERTION OF MESH Right 06/19/2021   Procedure: INSERTION OF MESH;  Surgeon: Axel Filler, MD;  Location: [facility] OR;  Service: General;  Laterality: Right;   kidney stones  1991   surgery to remove   XI ROBOTIC ASSISTED INGUINAL HERNIA REPAIR WITH MESH Right 06/19/2021   Procedure: XI ROBOTIC ASSISTED RIGHT INGUINAL HERNIA REPAIR WITH MESH;  Surgeon: Axel Filler, MD;  Location: [facility] OR;  Service: General;  Laterality: Right;    MEDICATIONS:  acetaminophen (TYLENOL) 500 MG tablet   calcium carbonate (TUMS EX) 750 MG chewable tablet   carbidopa-levodopa (SINEMET IR) 25-100 MG tablet   hydrocortisone cream 1 %   meloxicam (MOBIC) 15 MG tablet   Polyethyl Glycol-Propyl Glycol (LUBRICATING EYE DROPS OP)   rasagiline (AZILECT) 1 MG TABS tablet   rotigotine (NEUPRO) 4 MG/24HR   No current facility-administered medications for this encounter.   Marcille Blanco [facility] Surgical Short Stay/Anesthesiology [facility] Phone [phone] 09/19/2022 10:19 AM

## 2022-09-19 NOTE — Anesthesia Preprocedure Evaluation (Addendum)
Anesthesia Evaluation  Patient identified by MRN, date of birth, ID band Patient awake    Reviewed: Allergy & Precautions, NPO status , Patient's Chart, lab work & pertinent test results  Airway Mallampati: II  TM Distance: >3 FB Neck ROM: Full    Dental  (+) Teeth Intact, Dental Advisory Given   Pulmonary sleep apnea and Continuous Positive Airway Pressure Ventilation    Pulmonary exam normal breath sounds clear to auscultation       Cardiovascular negative cardio ROS Normal cardiovascular exam Rhythm:Regular Rate:Normal     Neuro/Psych PD s/p DBS insertion  Neuromuscular disease    GI/Hepatic negative GI ROS, Neg liver ROS,,,  Endo/Other  Obesity   Renal/GU negative Renal ROS     Musculoskeletal  (+) Arthritis , Osteoarthritis,    Abdominal   Peds  Hematology negative hematology ROS (+) Plt 215k   Anesthesia Other Findings Day of surgery medications reviewed with the patient.  Reproductive/Obstetrics                              Anesthesia Physical Anesthesia Plan  ASA: 3  Anesthesia Plan: Spinal   Post-op Pain Management: Tylenol PO (pre-op)*   Induction: Intravenous  PONV Risk Score and Plan: 1 and TIVA, Dexamethasone and Ondansetron  Airway Management Planned: Natural Airway and Simple Face Mask  Additional Equipment:   Intra-op Plan:   Post-operative Plan:   Informed Consent: I have reviewed the patients History and Physical, chart, labs and discussed the procedure including the risks, benefits and alternatives for the proposed anesthesia with the patient or authorized representative who has indicated his/her understanding and acceptance.     Dental advisory given  Plan Discussed with: CRNA, Anesthesiologist and Surgeon  Anesthesia Plan Comments: (See PAT note from 8/2 by Sherlie Ban PA-C )         Anesthesia Quick Evaluation

## 2022-09-24 ENCOUNTER — Ambulatory Visit: Payer: Self-pay | Admitting: Student

## 2022-09-24 NOTE — H&P (View-Only) (Signed)
TOTAL HIP ADMISSION H&P  Patient is admitted for right total hip arthroplasty.  Subjective:  Chief Complaint: right hip pain  HPI: [name], 67 y.o. male, has a history of pain and functional disability in the right hip(s) due to arthritis and patient has failed non-surgical conservative treatments for greater than 12 weeks to include NSAID's and/or analgesics, flexibility and strengthening excercises, use of assistive devices, and activity modification.  Onset of symptoms was gradual starting 10 years ago with rapidlly worsening course since that time.The patient noted no past surgery on the right hip(s).  Patient currently rates pain in the right hip at 10 out of 10 with activity. Patient has night pain, worsening of pain with activity and weight bearing, trendelenberg gait, pain that interfers with activities of daily living, and pain with passive range of motion. Patient has evidence of subchondral cysts, subchondral sclerosis, periarticular osteophytes, and joint space narrowing by imaging studies. This condition presents safety issues increasing the risk of falls.  There is no current active infection.  Patient Active Problem List   Diagnosis Date Noted   Obstructive sleep apnea treated with continuous positive airway pressure (CPAP) 03/31/2018   Parkinson's disease 07/15/2012   Past Medical History:  Diagnosis Date   Arthritis    History of kidney stones 04/14/2019   surgery to remove   Lesion of ulnar nerve    no current problems   Parkinson's disease    S/P deep brain stimulator    [employer] [phone], nondirectional lead   Sleep apnea    occasional uses cpap - setting at 7    Past Surgical History:  Procedure Laterality Date   COLONOSCOPY  2021   CYSTOSCOPY WITH RETROGRADE PYELOGRAM, URETEROSCOPY AND STENT PLACEMENT Right 04/14/2019   Procedure: CYSTOSCOPY WITH RETROGRADE PYELOGRAM, URETEROSCOPY AND STENT PLACEMENT;  Surgeon: Crista Elliot, MD;  Location: [facility];  Service: Urology;  Laterality: Right;   DBS surgery   03/2017   x 2 surg for phase 1 and phase 2 - brain stimulator for parkinson's disease   HAMMERTOE RECONSTRUCTION WITH WEIL OSTEOTOMY Right 05/25/2015   Procedure: RIGHT SECOND TOE METATARSAL WEIL OSTEOTOMY  HAMMERTOE CORRECTION COLLATERAL LIGAMENT REPAIR; FLEXOR TO EXTENSOR TRANSFER ;  Surgeon: Toni Arthurs, MD;  Location: [facility];  Service: Orthopedics;  Laterality: Right;   INGUINAL HERNIA REPAIR Right 12/21/2020   Procedure: REPAIR RIGHT INGUINAL HERNIA REPAIR WITH MESH;  Surgeon: Violeta Gelinas, MD;  Location: [facility] OR;  Service: General;  Laterality: Right;   INSERTION OF MESH Right 12/21/2020   Procedure: INSERTION OF MESH;  Surgeon: Violeta Gelinas, MD;  Location: [facility] OR;  Service: General;  Laterality: Right;   INSERTION OF MESH Right 06/19/2021   Procedure: INSERTION OF MESH;  Surgeon: Axel Filler, MD;  Location: [facility] OR;  Service: General;  Laterality: Right;   kidney stones  1991   surgery to remove   XI ROBOTIC ASSISTED INGUINAL HERNIA REPAIR WITH MESH Right 06/19/2021   Procedure: XI ROBOTIC ASSISTED RIGHT INGUINAL HERNIA REPAIR WITH MESH;  Surgeon: Axel Filler, MD;  Location: [facility] OR;  Service: General;  Laterality: Right;    Current Outpatient Medications  Medication Sig Dispense Refill Last Dose   acetaminophen (TYLENOL) 500 MG tablet Take 1,000 mg by mouth every 6 (six) hours as needed for moderate pain.      calcium carbonate (TUMS EX) 750 MG chewable tablet Chew 750 mg by mouth daily as needed for heartburn.  carbidopa-levodopa (SINEMET IR) 25-100 MG tablet Take 1.5 tablets by mouth 4 (four) times daily. At 8, 12, 4 PM and 8 PM (Patient taking differently: Take 1.5 tablets by mouth 5 (five) times daily.) 540 tablet 3    hydrocortisone cream 1 % Apply 1 Application topically 2 (two) times daily as needed for itching.      meloxicam (MOBIC) 15 MG tablet Take 15 mg by mouth daily.      Polyethyl  Glycol-Propyl Glycol (LUBRICATING EYE DROPS OP) Place 1 drop into the left eye daily.      rasagiline (AZILECT) 1 MG TABS tablet TAKE 1 TABLET BY MOUTH EVERY DAY 90 tablet 3    rotigotine (NEUPRO) 4 MG/24HR APPLY 1 PATCH ONTO THE SKIN EVERY DAY 90 patch 0    No current facility-administered medications for this visit.   Allergies  Allergen Reactions   Ropinirole Hcl Nausea Only   Turmeric Itching    Social History   Tobacco Use   Smoking status: Never   Smokeless tobacco: Never  Substance Use Topics   Alcohol use: Yes    Comment: occasional    Family History  Problem Relation Age of Onset   Sleep apnea Mother    Heart failure Father    Cancer Father    Diabetes Father    Sleep apnea Brother    Parkinson's disease Neg Hx      Review of Systems  Musculoskeletal:  Positive for arthralgias and gait problem.  All other systems reviewed and are negative.   Objective:  Physical Exam Constitutional:      Appearance: Normal appearance.  HENT:     Head: Normocephalic and atraumatic.     Nose: Nose normal.     Mouth/Throat:     Mouth: Mucous membranes are moist.     Pharynx: Oropharynx is clear.  Eyes:     Conjunctiva/sclera: Conjunctivae normal.  Cardiovascular:     Rate and Rhythm: Normal rate and regular rhythm.     Pulses: Normal pulses.     Heart sounds: Normal heart sounds.  Pulmonary:     Effort: Pulmonary effort is normal.     Breath sounds: Normal breath sounds.  Abdominal:     General: Abdomen is flat.     Palpations: Abdomen is soft.  Genitourinary:    Comments: Deferred. Musculoskeletal:     Cervical back: Normal range of motion and neck supple.     Comments: Examination of the right hip reveals no skin wounds or lesions. Mild trochanteric tenderness to palpation. He has severely restricted range of motion of the right hip with flexion contracture. Pain with terminal flexion and rotation. Pain in the position of impingement. Manipulation of the hip  recreates his hip and knee pain.  Examination of the right knee reveals no skin wounds or lesions. Mild swelling. No significant tenderness to palpation. Good range of motion without ligamentous instability. No extensor lag.  Distally, there is no focal motor or sensory deficit. He has palpable pedal pulses.  Venous insufficiency changes noted. Calves soft and non-tender.   He ambulates with a severely antalgic gait using a cane.  Skin:    General: Skin is warm and dry.     Capillary Refill: Capillary refill takes less than 2 seconds.  Neurological:     General: No focal deficit present.     Mental Status: He is alert and oriented to person, place, and time.  Psychiatric:        Mood and Affect:  Mood normal.        Behavior: Behavior normal.        Thought Content: Thought content normal.        Judgment: Judgment normal.     Vital signs in last 24 hours: @VSRANGES @  Labs:   Estimated body mass index is 33.57 kg/m as calculated from the following:   Height as of 09/13/22: 6' 2.5" (1.892 m).   Weight as of 09/13/22: 120.2 kg.   Imaging Review Plain radiographs demonstrate severe degenerative joint disease of the right hip(s). The bone quality appears to be adequate for age and reported activity level.      Assessment/Plan:  End stage arthritis, right hip(s)  The patient history, physical examination, clinical judgement of the provider and imaging studies are consistent with end stage degenerative joint disease of the right hip(s) and total hip arthroplasty is deemed medically necessary. The treatment options including medical management, injection therapy, arthroscopy and arthroplasty were discussed at length. The risks and benefits of total hip arthroplasty were presented and reviewed. The risks due to aseptic loosening, infection, stiffness, dislocation/subluxation,  thromboembolic complications and other imponderables were discussed.  The patient acknowledged the  explanation, agreed to proceed with the plan and consent was signed. Patient is being admitted for inpatient treatment for surgery, pain control, PT, OT, prophylactic antibiotics, VTE prophylaxis, progressive ambulation and ADL's and discharge planning.The patient is planning to be discharged home with HEP after an overnight stay.  Therapy Plans: HEP.  Disposition: Home with wife Planned DVT Prophylaxis: aspirin 81mg  BID DME needed: walker.  PCP: Cleared.  Neurology: Cleared. Stop Azilect 1-2 weeks prior to surgery, take 1/2 dose x1week then stop. Restart 3 days postoperative as long as not taking narcotic pain medication.  - Neurology additional notes: "In patients with Parkinson's disease we advise against the use of antipsychotics such as Risperdal or Haldol, since the dopamine blockade can cause worsening of the patient's parkinsonism. We typically treat hallucinations and behavioral changes with Seroquel, started at 25mg  daily, and titrated to as much as 150mg  if needed." TXA: IV Allergies: NDKA. Anesthesia Concerns: Per neurology. With respect to anesthesia, halothane, isoflurane, and sevoflurane will have a heightened risk of causing arrhythmia or hypotension. Fentanyl may temporarily worsen rigidity. BMI: 34 Last HgbA1c: 5.7 Other: - Parkinson's disease.  - OSA, doesn't use CPAP.  - Hydrocodone, zofran, has meloxicam.  - 09/13/22: Hgb 14.8, Cr. 0.74, K+3.9.     Patient's anticipated LOS is less than 2 midnights, meeting these requirements: - Younger than 12 - Lives within 1 hour of care - Has a competent adult at home to recover with post-op recover - NO history of  - Chronic pain requiring opiods  - Diabetes  - Coronary Artery Disease  - Heart failure  - Heart attack  - Stroke  - DVT/VTE  - Cardiac arrhythmia  - Respiratory Failure/COPD  - Renal failure  - Anemia  - Advanced Liver disease

## 2022-09-24 NOTE — H&P (Signed)
TOTAL HIP ADMISSION H&P  Patient is admitted for right total hip arthroplasty.  Subjective:  Chief Complaint: right hip pain  HPI: [name], 67 y.o. male, has a history of pain and functional disability in the right hip(s) due to arthritis and patient has failed non-surgical conservative treatments for greater than 12 weeks to include NSAID's and/or analgesics, flexibility and strengthening excercises, use of assistive devices, and activity modification.  Onset of symptoms was gradual starting 10 years ago with rapidlly worsening course since that time.The patient noted no past surgery on the right hip(s).  Patient currently rates pain in the right hip at 10 out of 10 with activity. Patient has night pain, worsening of pain with activity and weight bearing, trendelenberg gait, pain that interfers with activities of daily living, and pain with passive range of motion. Patient has evidence of subchondral cysts, subchondral sclerosis, periarticular osteophytes, and joint space narrowing by imaging studies. This condition presents safety issues increasing the risk of falls.  There is no current active infection.  Patient Active Problem List   Diagnosis Date Noted   Obstructive sleep apnea treated with continuous positive airway pressure (CPAP) 03/31/2018   Parkinson's disease 07/15/2012   Past Medical History:  Diagnosis Date   Arthritis    History of kidney stones 04/14/2019   surgery to remove   Lesion of ulnar nerve    no current problems   Parkinson's disease    S/P deep brain stimulator    [employer] [phone], nondirectional lead   Sleep apnea    occasional uses cpap - setting at 7    Past Surgical History:  Procedure Laterality Date   COLONOSCOPY  2021   CYSTOSCOPY WITH RETROGRADE PYELOGRAM, URETEROSCOPY AND STENT PLACEMENT Right 04/14/2019   Procedure: CYSTOSCOPY WITH RETROGRADE PYELOGRAM, URETEROSCOPY AND STENT PLACEMENT;  Surgeon: Crista Elliot, MD;  Location: [facility];  Service: Urology;  Laterality: Right;   DBS surgery   03/2017   x 2 surg for phase 1 and phase 2 - brain stimulator for parkinson's disease   HAMMERTOE RECONSTRUCTION WITH WEIL OSTEOTOMY Right 05/25/2015   Procedure: RIGHT SECOND TOE METATARSAL WEIL OSTEOTOMY  HAMMERTOE CORRECTION COLLATERAL LIGAMENT REPAIR; FLEXOR TO EXTENSOR TRANSFER ;  Surgeon: Toni Arthurs, MD;  Location: [facility];  Service: Orthopedics;  Laterality: Right;   INGUINAL HERNIA REPAIR Right 12/21/2020   Procedure: REPAIR RIGHT INGUINAL HERNIA REPAIR WITH MESH;  Surgeon: Violeta Gelinas, MD;  Location: [facility] OR;  Service: General;  Laterality: Right;   INSERTION OF MESH Right 12/21/2020   Procedure: INSERTION OF MESH;  Surgeon: Violeta Gelinas, MD;  Location: [facility] OR;  Service: General;  Laterality: Right;   INSERTION OF MESH Right 06/19/2021   Procedure: INSERTION OF MESH;  Surgeon: Axel Filler, MD;  Location: [facility] OR;  Service: General;  Laterality: Right;   kidney stones  1991   surgery to remove   XI ROBOTIC ASSISTED INGUINAL HERNIA REPAIR WITH MESH Right 06/19/2021   Procedure: XI ROBOTIC ASSISTED RIGHT INGUINAL HERNIA REPAIR WITH MESH;  Surgeon: Axel Filler, MD;  Location: [facility] OR;  Service: General;  Laterality: Right;    Current Outpatient Medications  Medication Sig Dispense Refill Last Dose   acetaminophen (TYLENOL) 500 MG tablet Take 1,000 mg by mouth every 6 (six) hours as needed for moderate pain.      calcium carbonate (TUMS EX) 750 MG chewable tablet Chew 750 mg by mouth daily as needed for heartburn.  carbidopa-levodopa (SINEMET IR) 25-100 MG tablet Take 1.5 tablets by mouth 4 (four) times daily. At 8, 12, 4 PM and 8 PM (Patient taking differently: Take 1.5 tablets by mouth 5 (five) times daily.) 540 tablet 3    hydrocortisone cream 1 % Apply 1 Application topically 2 (two) times daily as needed for itching.      meloxicam (MOBIC) 15 MG tablet Take 15 mg by mouth daily.      Polyethyl  Glycol-Propyl Glycol (LUBRICATING EYE DROPS OP) Place 1 drop into the left eye daily.      rasagiline (AZILECT) 1 MG TABS tablet TAKE 1 TABLET BY MOUTH EVERY DAY 90 tablet 3    rotigotine (NEUPRO) 4 MG/24HR APPLY 1 PATCH ONTO THE SKIN EVERY DAY 90 patch 0    No current facility-administered medications for this visit.   Allergies  Allergen Reactions   Ropinirole Hcl Nausea Only   Turmeric Itching    Social History   Tobacco Use   Smoking status: Never   Smokeless tobacco: Never  Substance Use Topics   Alcohol use: Yes    Comment: occasional    Family History  Problem Relation Age of Onset   Sleep apnea Mother    Heart failure Father    Cancer Father    Diabetes Father    Sleep apnea Brother    Parkinson's disease Neg Hx      Review of Systems  Musculoskeletal:  Positive for arthralgias and gait problem.  All other systems reviewed and are negative.   Objective:  Physical Exam Constitutional:      Appearance: Normal appearance.  HENT:     Head: Normocephalic and atraumatic.     Nose: Nose normal.     Mouth/Throat:     Mouth: Mucous membranes are moist.     Pharynx: Oropharynx is clear.  Eyes:     Conjunctiva/sclera: Conjunctivae normal.  Cardiovascular:     Rate and Rhythm: Normal rate and regular rhythm.     Pulses: Normal pulses.     Heart sounds: Normal heart sounds.  Pulmonary:     Effort: Pulmonary effort is normal.     Breath sounds: Normal breath sounds.  Abdominal:     General: Abdomen is flat.     Palpations: Abdomen is soft.  Genitourinary:    Comments: Deferred. Musculoskeletal:     Cervical back: Normal range of motion and neck supple.     Comments: Examination of the right hip reveals no skin wounds or lesions. Mild trochanteric tenderness to palpation. He has severely restricted range of motion of the right hip with flexion contracture. Pain with terminal flexion and rotation. Pain in the position of impingement. Manipulation of the hip  recreates his hip and knee pain.  Examination of the right knee reveals no skin wounds or lesions. Mild swelling. No significant tenderness to palpation. Good range of motion without ligamentous instability. No extensor lag.  Distally, there is no focal motor or sensory deficit. He has palpable pedal pulses.  Venous insufficiency changes noted. Calves soft and non-tender.   He ambulates with a severely antalgic gait using a cane.  Skin:    General: Skin is warm and dry.     Capillary Refill: Capillary refill takes less than 2 seconds.  Neurological:     General: No focal deficit present.     Mental Status: He is alert and oriented to person, place, and time.  Psychiatric:        Mood and Affect:  Mood normal.        Behavior: Behavior normal.        Thought Content: Thought content normal.        Judgment: Judgment normal.     Vital signs in last 24 hours: @VSRANGES @  Labs:   Estimated body mass index is 33.57 kg/m as calculated from the following:   Height as of 09/13/22: 6' 2.5" (1.892 m).   Weight as of 09/13/22: 120.2 kg.   Imaging Review Plain radiographs demonstrate severe degenerative joint disease of the right hip(s). The bone quality appears to be adequate for age and reported activity level.      Assessment/Plan:  End stage arthritis, right hip(s)  The patient history, physical examination, clinical judgement of the provider and imaging studies are consistent with end stage degenerative joint disease of the right hip(s) and total hip arthroplasty is deemed medically necessary. The treatment options including medical management, injection therapy, arthroscopy and arthroplasty were discussed at length. The risks and benefits of total hip arthroplasty were presented and reviewed. The risks due to aseptic loosening, infection, stiffness, dislocation/subluxation,  thromboembolic complications and other imponderables were discussed.  The patient acknowledged the  explanation, agreed to proceed with the plan and consent was signed. Patient is being admitted for inpatient treatment for surgery, pain control, PT, OT, prophylactic antibiotics, VTE prophylaxis, progressive ambulation and ADL's and discharge planning.The patient is planning to be discharged home with HEP after an overnight stay.  Therapy Plans: HEP.  Disposition: Home with wife Planned DVT Prophylaxis: aspirin 81mg  BID DME needed: walker.  PCP: Cleared.  Neurology: Cleared. Stop Azilect 1-2 weeks prior to surgery, take 1/2 dose x1week then stop. Restart 3 days postoperative as long as not taking narcotic pain medication.  - Neurology additional notes: "In patients with Parkinson's disease we advise against the use of antipsychotics such as Risperdal or Haldol, since the dopamine blockade can cause worsening of the patient's parkinsonism. We typically treat hallucinations and behavioral changes with Seroquel, started at 25mg  daily, and titrated to as much as 150mg  if needed." TXA: IV Allergies: NDKA. Anesthesia Concerns: Per neurology. With respect to anesthesia, halothane, isoflurane, and sevoflurane will have a heightened risk of causing arrhythmia or hypotension. Fentanyl may temporarily worsen rigidity. BMI: 34 Last HgbA1c: 5.7 Other: - Parkinson's disease.  - OSA, doesn't use CPAP.  - Hydrocodone, zofran, has meloxicam.  - 09/13/22: Hgb 14.8, Cr. 0.74, K+3.9.     Patient's anticipated LOS is less than 2 midnights, meeting these requirements: - Younger than 12 - Lives within 1 hour of care - Has a competent adult at home to recover with post-op recover - NO history of  - Chronic pain requiring opiods  - Diabetes  - Coronary Artery Disease  - Heart failure  - Heart attack  - Stroke  - DVT/VTE  - Cardiac arrhythmia  - Respiratory Failure/COPD  - Renal failure  - Anemia  - Advanced Liver disease

## 2022-09-25 ENCOUNTER — Encounter (HOSPITAL_COMMUNITY)
Admission: RE | Disposition: A | Discharge status: Home / Self Care | Payer: Self-pay | Source: Ambulatory Visit | Attending: Orthopedic Surgery

## 2022-09-25 ENCOUNTER — Ambulatory Visit (HOSPITAL_COMMUNITY): Payer: Managed Care, Other (non HMO)

## 2022-09-25 ENCOUNTER — Observation Stay (HOSPITAL_COMMUNITY): Payer: Managed Care, Other (non HMO)

## 2022-09-25 ENCOUNTER — Observation Stay (HOSPITAL_COMMUNITY)
Admission: RE | Admit: 2022-09-25 | Discharge: 2022-09-26 | Disposition: A | Discharge status: Home / Self Care | Payer: Managed Care, Other (non HMO) | Source: Ambulatory Visit | Attending: Orthopedic Surgery | Admitting: Orthopedic Surgery

## 2022-09-25 ENCOUNTER — Ambulatory Visit (HOSPITAL_COMMUNITY): Payer: Managed Care, Other (non HMO) | Admitting: Medical

## 2022-09-25 ENCOUNTER — Encounter (HOSPITAL_COMMUNITY): Payer: Self-pay | Admitting: Orthopedic Surgery

## 2022-09-25 ENCOUNTER — Other Ambulatory Visit: Payer: Self-pay

## 2022-09-25 ENCOUNTER — Ambulatory Visit (HOSPITAL_BASED_OUTPATIENT_CLINIC_OR_DEPARTMENT_OTHER): Payer: Managed Care, Other (non HMO) | Admitting: Anesthesiology

## 2022-09-25 DIAGNOSIS — G4733 Obstructive sleep apnea (adult) (pediatric): Secondary | ICD-10-CM

## 2022-09-25 DIAGNOSIS — M1611 Unilateral primary osteoarthritis, right hip: Secondary | ICD-10-CM | POA: Diagnosis present

## 2022-09-25 DIAGNOSIS — Z96641 Presence of right artificial hip joint: Secondary | ICD-10-CM | POA: Diagnosis present

## 2022-09-25 DIAGNOSIS — G20C Parkinsonism, unspecified: Secondary | ICD-10-CM | POA: Diagnosis not present

## 2022-09-25 DIAGNOSIS — Z79899 Other long term (current) drug therapy: Secondary | ICD-10-CM | POA: Diagnosis not present

## 2022-09-25 HISTORY — PX: TOTAL HIP ARTHROPLASTY: SHX124

## 2022-09-25 SURGERY — ARTHROPLASTY, HIP, TOTAL, ANTERIOR APPROACH
Anesthesia: Spinal | Site: Hip | Laterality: Right

## 2022-09-25 MED ORDER — BUPIVACAINE-EPINEPHRINE 0.25% -1:200000 IJ SOLN
INTRAMUSCULAR | Status: AC
  Filled 2022-09-25: qty 1

## 2022-09-25 MED ORDER — BUPIVACAINE-EPINEPHRINE 0.25% -1:200000 IJ SOLN
INTRAMUSCULAR | Status: DC | PRN
  Administered 2022-09-25: 30 mL

## 2022-09-25 MED ORDER — ONDANSETRON HCL 4 MG PO TABS: 4.0000 mg | ORAL_TABLET | Freq: Four times a day (QID) | ORAL | Status: DC | PRN

## 2022-09-25 MED ORDER — KETOROLAC TROMETHAMINE 30 MG/ML IJ SOLN
INTRAMUSCULAR | Status: AC
  Filled 2022-09-25: qty 1

## 2022-09-25 MED ORDER — HYDROCODONE-ACETAMINOPHEN 5-325 MG PO TABS
1.0000 | ORAL_TABLET | ORAL | Status: DC | PRN
  Administered 2022-09-25 – 2022-09-26 (×5): 2 via ORAL
  Filled 2022-09-25 (×5): qty 2

## 2022-09-25 MED ORDER — ACETAMINOPHEN 500 MG PO TABS
1000.0000 mg | ORAL_TABLET | Freq: Once | ORAL | Status: AC
  Administered 2022-09-25: 1000 mg via ORAL
  Filled 2022-09-25: qty 2

## 2022-09-25 MED ORDER — ONDANSETRON HCL 4 MG/2ML IJ SOLN
INTRAMUSCULAR | Status: AC
  Filled 2022-09-25: qty 2

## 2022-09-25 MED ORDER — LACTATED RINGERS IV SOLN: INTRAVENOUS | Status: DC

## 2022-09-25 MED ORDER — ALUM & MAG HYDROXIDE-SIMETH 200-200-20 MG/5ML PO SUSP: 30.0000 mL | ORAL | Status: DC | PRN

## 2022-09-25 MED ORDER — HYDROCODONE-ACETAMINOPHEN 7.5-325 MG PO TABS: 1.0000 | ORAL_TABLET | ORAL | Status: DC | PRN

## 2022-09-25 MED ORDER — POVIDONE-IODINE 10 % EX SWAB: 2.0000 | Freq: Once | CUTANEOUS | Status: DC

## 2022-09-25 MED ORDER — CARBIDOPA-LEVODOPA 25-100 MG PO TABS
1.5000 | ORAL_TABLET | Freq: Every day | ORAL | Status: DC
  Administered 2022-09-25 – 2022-09-26 (×5): 1.5 via ORAL
  Filled 2022-09-25: qty 1.5
  Filled 2022-09-25 (×4): qty 2

## 2022-09-25 MED ORDER — SODIUM CHLORIDE 0.9 % IV SOLN: INTRAVENOUS | Status: DC

## 2022-09-25 MED ORDER — MORPHINE SULFATE (PF) 2 MG/ML IV SOLN: 0.5000 mg | INTRAVENOUS | Status: DC | PRN

## 2022-09-25 MED ORDER — TRANEXAMIC ACID-NACL 1000-0.7 MG/100ML-% IV SOLN
1000.0000 mg | INTRAVENOUS | Status: AC
  Administered 2022-09-25: 1000 mg via INTRAVENOUS
  Filled 2022-09-25: qty 100

## 2022-09-25 MED ORDER — PANTOPRAZOLE SODIUM 40 MG PO TBEC
40.0000 mg | DELAYED_RELEASE_TABLET | Freq: Every day | ORAL | Status: DC
  Administered 2022-09-25 – 2022-09-26 (×2): 40 mg via ORAL
  Filled 2022-09-25 (×2): qty 1

## 2022-09-25 MED ORDER — METOCLOPRAMIDE HCL 5 MG PO TABS: 5.0000 mg | ORAL_TABLET | Freq: Three times a day (TID) | ORAL | Status: DC | PRN

## 2022-09-25 MED ORDER — ONDANSETRON HCL 4 MG/2ML IJ SOLN: 4.0000 mg | Freq: Four times a day (QID) | INTRAMUSCULAR | Status: DC | PRN

## 2022-09-25 MED ORDER — METOCLOPRAMIDE HCL 5 MG/ML IJ SOLN: 5.0000 mg | Freq: Three times a day (TID) | INTRAMUSCULAR | Status: DC | PRN

## 2022-09-25 MED ORDER — CHLORHEXIDINE GLUCONATE 0.12 % MT SOLN
15.0000 mL | Freq: Once | OROMUCOSAL | Status: AC
  Administered 2022-09-25: 15 mL via OROMUCOSAL

## 2022-09-25 MED ORDER — KETOROLAC TROMETHAMINE 30 MG/ML IJ SOLN
INTRAMUSCULAR | Status: DC | PRN
  Administered 2022-09-25: 30 mg

## 2022-09-25 MED ORDER — LACTATED RINGERS IV SOLN: INTRAVENOUS | Status: DC | PRN

## 2022-09-25 MED ORDER — ONDANSETRON HCL 4 MG/2ML IJ SOLN: 4.0000 mg | Freq: Once | INTRAMUSCULAR | Status: DC | PRN

## 2022-09-25 MED ORDER — PROPOFOL 500 MG/50ML IV EMUL
INTRAVENOUS | Status: DC | PRN
  Administered 2022-09-25: 40 ug/kg/min via INTRAVENOUS

## 2022-09-25 MED ORDER — ACETAMINOPHEN 500 MG PO TABS: 500.0000 mg | ORAL_TABLET | Freq: Four times a day (QID) | ORAL | Status: DC

## 2022-09-25 MED ORDER — ISOPROPYL ALCOHOL 70 % SOLN
Status: DC | PRN
  Administered 2022-09-25: 1 via TOPICAL

## 2022-09-25 MED ORDER — LIDOCAINE HCL (PF) 2 % IJ SOLN
INTRAMUSCULAR | Status: AC
  Filled 2022-09-25: qty 5

## 2022-09-25 MED ORDER — DOCUSATE SODIUM 100 MG PO CAPS
100.0000 mg | ORAL_CAPSULE | Freq: Two times a day (BID) | ORAL | Status: DC
  Administered 2022-09-25 – 2022-09-26 (×2): 100 mg via ORAL
  Filled 2022-09-25 (×2): qty 1

## 2022-09-25 MED ORDER — CARBIDOPA-LEVODOPA 25-100 MG PO TABS
1.5000 | ORAL_TABLET | Freq: Once | ORAL | Status: DC
  Filled 2022-09-25: qty 1.5

## 2022-09-25 MED ORDER — SODIUM CHLORIDE (PF) 0.9 % IJ SOLN
INTRAMUSCULAR | Status: AC
  Filled 2022-09-25: qty 50

## 2022-09-25 MED ORDER — DIPHENHYDRAMINE HCL 12.5 MG/5ML PO ELIX: 12.5000 mg | ORAL_SOLUTION | ORAL | Status: DC | PRN

## 2022-09-25 MED ORDER — CEFAZOLIN IN SODIUM CHLORIDE 3-0.9 GM/100ML-% IV SOLN
3.0000 g | INTRAVENOUS | Status: AC
  Administered 2022-09-25: 3 g via INTRAVENOUS
  Filled 2022-09-25: qty 100

## 2022-09-25 MED ORDER — ACETAMINOPHEN 325 MG PO TABS: 325.0000 mg | ORAL_TABLET | Freq: Four times a day (QID) | ORAL | Status: DC | PRN

## 2022-09-25 MED ORDER — PROPOFOL 1000 MG/100ML IV EMUL
INTRAVENOUS | Status: AC
  Filled 2022-09-25: qty 100

## 2022-09-25 MED ORDER — BISACODYL 10 MG RE SUPP: 10.0000 mg | Freq: Every day | RECTAL | Status: DC | PRN

## 2022-09-25 MED ORDER — DEXAMETHASONE SODIUM PHOSPHATE 10 MG/ML IJ SOLN
INTRAMUSCULAR | Status: AC
  Filled 2022-09-25: qty 1

## 2022-09-25 MED ORDER — HYDROMORPHONE HCL 1 MG/ML IJ SOLN: 0.2500 mg | INTRAMUSCULAR | Status: DC | PRN

## 2022-09-25 MED ORDER — CEFAZOLIN SODIUM-DEXTROSE 2-4 GM/100ML-% IV SOLN
2.0000 g | Freq: Four times a day (QID) | INTRAVENOUS | Status: AC
  Administered 2022-09-25 (×2): 2 g via INTRAVENOUS
  Filled 2022-09-25 (×2): qty 100

## 2022-09-25 MED ORDER — METHOCARBAMOL 1000 MG/10ML IJ SOLN: 500.0000 mg | Freq: Four times a day (QID) | INTRAVENOUS | Status: DC | PRN

## 2022-09-25 MED ORDER — SENNA 8.6 MG PO TABS
1.0000 | ORAL_TABLET | Freq: Two times a day (BID) | ORAL | Status: DC
  Administered 2022-09-25 – 2022-09-26 (×2): 8.6 mg via ORAL
  Filled 2022-09-25 (×2): qty 1

## 2022-09-25 MED ORDER — WATER FOR IRRIGATION, STERILE IR SOLN
Status: DC | PRN
  Administered 2022-09-25: 2000 mL

## 2022-09-25 MED ORDER — FENTANYL CITRATE (PF) 100 MCG/2ML IJ SOLN
INTRAMUSCULAR | Status: AC
  Filled 2022-09-25: qty 2

## 2022-09-25 MED ORDER — SODIUM CHLORIDE (PF) 0.9 % IJ SOLN
INTRAMUSCULAR | Status: DC | PRN
  Administered 2022-09-25: 30 mL

## 2022-09-25 MED ORDER — OXYCODONE HCL 5 MG PO TABS: 5.0000 mg | ORAL_TABLET | Freq: Once | ORAL | Status: DC | PRN

## 2022-09-25 MED ORDER — ORAL CARE MOUTH RINSE: 15.0000 mL | Freq: Once | OROMUCOSAL | Status: AC

## 2022-09-25 MED ORDER — BUPIVACAINE IN DEXTROSE 0.75-8.25 % IT SOLN
INTRATHECAL | Status: DC | PRN
Start: 2022-09-25 — End: 2022-09-25
  Administered 2022-09-25: 2 mL via INTRATHECAL

## 2022-09-25 MED ORDER — POLYETHYLENE GLYCOL 3350 17 G PO PACK: 17.0000 g | PACK | Freq: Every day | ORAL | Status: DC | PRN

## 2022-09-25 MED ORDER — PHENOL 1.4 % MT LIQD: 1.0000 | OROMUCOSAL | Status: DC | PRN

## 2022-09-25 MED ORDER — ASPIRIN 81 MG PO CHEW
81.0000 mg | CHEWABLE_TABLET | Freq: Two times a day (BID) | ORAL | Status: DC
  Administered 2022-09-25 – 2022-09-26 (×2): 81 mg via ORAL
  Filled 2022-09-25 (×2): qty 1

## 2022-09-25 MED ORDER — FENTANYL CITRATE (PF) 100 MCG/2ML IJ SOLN
INTRAMUSCULAR | Status: DC | PRN
  Administered 2022-09-25 (×2): 25 ug via INTRAVENOUS
  Administered 2022-09-25: 50 ug via INTRAVENOUS

## 2022-09-25 MED ORDER — OXYCODONE HCL 5 MG/5ML PO SOLN: 5.0000 mg | Freq: Once | ORAL | Status: DC | PRN

## 2022-09-25 MED ORDER — MENTHOL 3 MG MT LOZG: 1.0000 | LOZENGE | OROMUCOSAL | Status: DC | PRN

## 2022-09-25 MED ORDER — DEXAMETHASONE SODIUM PHOSPHATE 10 MG/ML IJ SOLN
INTRAMUSCULAR | Status: DC | PRN
  Administered 2022-09-25: 10 mg via INTRAVENOUS

## 2022-09-25 MED ORDER — SODIUM CHLORIDE 0.9 % IR SOLN
Status: DC | PRN
  Administered 2022-09-25: 1000 mL
  Administered 2022-09-25: 3000 mL

## 2022-09-25 MED ORDER — ROTIGOTINE 4 MG/24HR TD PT24: 1.0000 | MEDICATED_PATCH | Freq: Every day | TRANSDERMAL | Status: DC

## 2022-09-25 MED ORDER — METHOCARBAMOL 500 MG PO TABS
500.0000 mg | ORAL_TABLET | Freq: Four times a day (QID) | ORAL | Status: DC | PRN
  Administered 2022-09-26: 500 mg via ORAL
  Filled 2022-09-25: qty 1

## 2022-09-25 SURGICAL SUPPLY — 54 items
ADH SKN CLS APL DERMABOND .7 (GAUZE/BANDAGES/DRESSINGS) ×2
APL PRP STRL LF DISP 70% ISPRP (MISCELLANEOUS) ×1
BAG COUNTER SPONGE SURGICOUNT (BAG) IMPLANT
BAG SPEC THK2 15X12 ZIP CLS (MISCELLANEOUS)
BAG SPNG CNTER NS LX DISP (BAG)
BAG ZIPLOCK 12X15 (MISCELLANEOUS) IMPLANT
CHLORAPREP W/TINT 26 (MISCELLANEOUS) ×1 IMPLANT
COVER PERINEAL POST (MISCELLANEOUS) ×1 IMPLANT
COVER SURGICAL LIGHT HANDLE (MISCELLANEOUS) ×1 IMPLANT
DERMABOND ADVANCED .7 DNX12 (GAUZE/BANDAGES/DRESSINGS) ×2 IMPLANT
DRAPE IMP U-DRAPE 54X76 (DRAPES) ×1 IMPLANT
DRAPE SHEET LG 3/4 BI-LAMINATE (DRAPES) ×3 IMPLANT
DRAPE STERI IOBAN 125X83 (DRAPES) ×1 IMPLANT
DRAPE U-SHAPE 47X51 STRL (DRAPES) ×1 IMPLANT
DRSG AQUACEL AG ADV 3.5X10 (GAUZE/BANDAGES/DRESSINGS) ×1 IMPLANT
ELECT REM PT RETURN 15FT ADLT (MISCELLANEOUS) ×1 IMPLANT
GAUZE SPONGE 4X4 12PLY STRL (GAUZE/BANDAGES/DRESSINGS) ×1 IMPLANT
GLOVE BIO SURGEON STRL SZ7 (GLOVE) ×1 IMPLANT
GLOVE BIO SURGEON STRL SZ8.5 (GLOVE) ×2 IMPLANT
GLOVE BIOGEL PI IND STRL 7.5 (GLOVE) ×1 IMPLANT
GLOVE BIOGEL PI IND STRL 8.5 (GLOVE) ×1 IMPLANT
GOWN SPEC L3 XXLG W/TWL (GOWN DISPOSABLE) ×1 IMPLANT
GOWN STRL REUS W/ TWL XL LVL3 (GOWN DISPOSABLE) ×1 IMPLANT
GOWN STRL REUS W/TWL XL LVL3 (GOWN DISPOSABLE) ×1
HANDPIECE INTERPULSE COAX TIP (DISPOSABLE) ×1
HEAD CERAMIC BIOLOX 36 (Head) IMPLANT
HOLDER FOLEY CATH W/STRAP (MISCELLANEOUS) ×1 IMPLANT
HOOD PEEL AWAY T7 (MISCELLANEOUS) ×3 IMPLANT
KIT TURNOVER KIT A (KITS) IMPLANT
LINER ACETAB OFF G7 5 36 +5 (Liner) IMPLANT
MANIFOLD NEPTUNE II (INSTRUMENTS) ×1 IMPLANT
MARKER SKIN DUAL TIP RULER LAB (MISCELLANEOUS) ×1 IMPLANT
NDL SAFETY ECLIP 18X1.5 (MISCELLANEOUS) ×1 IMPLANT
NDL SPNL 18GX3.5 QUINCKE PK (NEEDLE) ×1 IMPLANT
NEEDLE SPNL 18GX3.5 QUINCKE PK (NEEDLE) ×1 IMPLANT
PACK ANTERIOR HIP CUSTOM (KITS) ×1 IMPLANT
SAW OSC TIP CART 19.5X105X1.3 (SAW) ×1 IMPLANT
SEALER BIPOLAR AQUA 6.0 (INSTRUMENTS) ×1 IMPLANT
SET HNDPC FAN SPRY TIP SCT (DISPOSABLE) ×1 IMPLANT
SHELL ACET G7 4H 60 SZG (Shell) IMPLANT
SOLUTION PRONTOSAN WOUND 350ML (IRRIGATION / IRRIGATOR) ×1 IMPLANT
SPIKE FLUID TRANSFER (MISCELLANEOUS) ×1 IMPLANT
SUT MNCRL AB 3-0 PS2 18 (SUTURE) ×1 IMPLANT
SUT MON AB 2-0 CT1 36 (SUTURE) ×1 IMPLANT
SUT STRATAFIX PDO 1 14 VIOLET (SUTURE) ×1
SUT STRATFX PDO 1 14 VIOLET (SUTURE) ×1
SUT VIC AB 2-0 CT1 27 (SUTURE) ×1
SUT VIC AB 2-0 CT1 TAPERPNT 27 (SUTURE) IMPLANT
SUTURE STRATFX PDO 1 14 VIOLET (SUTURE) ×1 IMPLANT
SYR 3ML LL SCALE MARK (SYRINGE) ×1 IMPLANT
TAPERLOC MCRO PRMY FMRL 16X117 (Joint) IMPLANT
TRAY FOLEY MTR SLVR 16FR STAT (SET/KITS/TRAYS/PACK) IMPLANT
TUBE SUCTION HIGH CAP CLEAR NV (SUCTIONS) ×1 IMPLANT
WATER STERILE IRR 1000ML POUR (IV SOLUTION) ×1 IMPLANT

## 2022-09-25 NOTE — Interval H&P Note (Signed)
History and Physical Interval Note:  09/25/2022 10:59 AM  [name]  has presented today for surgery, with the diagnosis of Right hip osteoarthrtiis.  The various methods of treatment have been discussed with the patient and family. After consideration of risks, benefits and other options for treatment, the patient has consented to  Procedure(s) with comments: TOTAL HIP ARTHROPLASTY ANTERIOR APPROACH (Right) - 130 as a surgical intervention.  The patient's history has been reviewed, patient examined, no change in status, stable for surgery.  I have reviewed the patient's chart and labs.  Questions were answered to the patient's satisfaction.     Iline Oven 

## 2022-09-25 NOTE — Evaluation (Signed)
Physical Therapy Evaluation Patient Details Name: [name] MRN: [id number] DOB: 1955-05-22 Today's Date: 09/25/2022  History of Present Illness  67 yo male presents to therapy s/p R THA, anterior approach on 09/25/2022 due to failure of conservative measures. Pt PMH includes but is not limited to: PD, deep brain stimulator in situ, OSA on CPAP, kidney stones,  and hernia repairs.  Clinical Impression    [name] is a 67 y.o. male POD 0 s/p R THA. Patient reports mod I with mobility at baseline. Patient is now limited by functional impairments (see PT problem list below) and requires mod A for bed mobility and min A for transfers. Patient was able to ambulate 12 feet with RW and CGA with recliner close level of assist. Patient instructed in exercise to facilitate ROM and circulation to manage edema.  Patient will benefit from continued skilled PT interventions to address impairments and progress towards PLOF. Acute PT will follow to progress mobility and stair training in preparation for safe discharge home with family support and HEP.       If plan is discharge home, recommend the following: A little help with walking and/or transfers;A little help with bathing/dressing/bathroom;Assistance with cooking/housework;Assist for transportation;Help with stairs or ramp for entrance   Can travel by private vehicle        Equipment Recommendations None recommended by PT  Recommendations for Other Services       Functional Status Assessment Patient has had a recent decline in their functional status and demonstrates the ability to make significant improvements in function in a reasonable and predictable amount of time.     Precautions / Restrictions Precautions Precautions: Fall Restrictions Weight Bearing Restrictions: No      Mobility  Bed Mobility Overal bed mobility: Needs Assistance Bed Mobility: Supine to Sit     Supine to sit: Mod assist, HOB elevated, Used rails      General bed mobility comments: increased time, cues and mod A for R LE and trunk    Transfers Overall transfer level: Needs assistance Equipment used: Rolling walker (2 wheels) Transfers: Sit to/from Stand Sit to Stand: Min assist, From elevated surface           General transfer comment: cues and increased time    Ambulation/Gait Ambulation/Gait assistance: Contact guard assist Gait Distance (Feet): 12 Feet Assistive device: Rolling walker (2 wheels) Gait Pattern/deviations: Step-to pattern, Trunk flexed, Antalgic Gait velocity: decreased     General Gait Details: limited B foot clearance and stride length  Stairs            Wheelchair Mobility     Tilt Bed    Modified Rankin (Stroke Patients Only)       Balance Overall balance assessment: Needs assistance Sitting-balance support: Feet supported Sitting balance-Leahy Scale: Fair     Standing balance support: Bilateral upper extremity supported, During functional activity, Reliant on assistive device for balance Standing balance-Leahy Scale: Poor                               Pertinent Vitals/Pain Pain Assessment Pain Assessment: 0-10 Pain Score: 6  Pain Location: R hip and LBP Pain Descriptors / Indicators: Aching, Constant, Discomfort, Operative site guarding Pain Intervention(s): Limited activity within patient's tolerance, Monitored during session, Premedicated before session, Repositioned, Ice applied    Home Living Family/patient expects to be discharged to:: Private residence Living Arrangements: Spouse/significant other Available Help at Discharge:  Family Type of Home: House Home Access: Stairs to enter Entrance Stairs-Rails: Doctor, general practice of Steps: 4   Home Layout: Two level;Able to live on main level with bedroom/bathroom Home Equipment: Rolling Walker (2 wheels);Cane - single point      Prior Function Prior Level of Function : Independent/Modified  Independent             Mobility Comments: mod I with SPC or no AD for ambulation, pt required assist for lower body dressing/socks and shoes       Extremity/Trunk Assessment        Lower Extremity Assessment Lower Extremity Assessment: RLE deficits/detail RLE Deficits / Details: ankle DF/PF 5/5 RLE Sensation: WNL    Cervical / Trunk Assessment Cervical / Trunk Assessment:  (slight head forward)  Communication   Communication Communication: No apparent difficulties (may require increased time for motor processing and planning and improved mobility with visual target secondary to PD)  Cognition Arousal: Alert Behavior During Therapy: WFL for tasks assessed/performed Overall Cognitive Status: Within Functional Limits for tasks assessed                                          General Comments      Exercises Total Joint Exercises Ankle Circles/Pumps: AROM, Both, 10 reps   Assessment/Plan    PT Assessment Patient needs continued PT services  PT Problem List Decreased strength;Decreased range of motion;Decreased activity tolerance;Decreased balance;Decreased mobility;Decreased coordination;Pain       PT Treatment Interventions DME instruction;Gait training;Stair training;Functional mobility training;Therapeutic activities;Therapeutic exercise;Balance training;Neuromuscular re-education;Patient/family education;Modalities    PT Goals (Current goals can be found in the Care Plan section)  Acute Rehab PT Goals Patient Stated Goal: to be able to amb on golf course and beach (uneven surfaces) without pain PT Goal Formulation: With patient Time For Goal Achievement: 10/09/22 Potential to Achieve Goals: Good    Frequency 7X/week     Co-evaluation               AM-PAC PT "6 Clicks" Mobility  Outcome Measure Help needed turning from your back to your side while in a flat bed without using bedrails?: A Little Help needed moving from lying on  your back to sitting on the side of a flat bed without using bedrails?: A Lot Help needed moving to and from a bed to a chair (including a wheelchair)?: A Little Help needed standing up from a chair using your arms (e.g., wheelchair or bedside chair)?: A Little Help needed to walk in hospital room?: A Little Help needed climbing 3-5 steps with a railing? : A Lot 6 Click Score: 16    End of Session Equipment Utilized During Treatment: Gait belt Activity Tolerance: Patient tolerated treatment well Patient left: in chair;with call bell/phone within reach;with family/visitor present Nurse Communication: Mobility status PT Visit Diagnosis: Unsteadiness on feet (R26.81);Other abnormalities of gait and mobility (R26.89);Muscle weakness (generalized) (M62.81);Difficulty in walking, not elsewhere classified (R26.2);Pain Pain - Right/Left: Right Pain - part of body: Hip;Leg (back)    Time: 9528-4132 PT Time Calculation (min) (ACUTE ONLY): 41 min   Charges:   PT Evaluation $PT Eval Low Complexity: 1 Low PT Treatments $Gait Training: 8-22 mins $Therapeutic Activity: 8-22 mins PT General Charges $$ ACUTE PT VISIT: 1 Visit         Johnny Bridge, PT Acute Rehab   Jacqualyn Posey 09/25/2022, 7:28 PM

## 2022-09-25 NOTE — Op Note (Signed)
OPERATIVE REPORT  SURGEON: Samson Frederic, MD   ASSISTANT: Clint Bolder, PA-C.  PREOPERATIVE DIAGNOSIS: Right hip arthritis.   POSTOPERATIVE DIAGNOSIS: Right hip arthritis.   PROCEDURE: Right total hip arthroplasty, anterior approach.   IMPLANTS: Biomet Taperloc Complete Microplasty stem, size 16 x 117 mm, high offset. Biomet G7 OsseoTi Cup, size 60 mm. Biomet Vivacit-E liner, size 36 mm, G, +5 neutral. Biomet Biolox ceramic head ball, size 36 - 3 mm.  ANESTHESIA:  MAC and Spinal  ESTIMATED BLOOD LOSS:-350 mL    ANTIBIOTICS: 3g Ancef.  DRAINS: None.  COMPLICATIONS: None.   CONDITION: PACU - hemodynamically stable.   BRIEF CLINICAL NOTE: [name] is a 67 y.o. male with a long-standing history of Right hip arthritis. After failing conservative management, the patient was indicated for total hip arthroplasty. The risks, benefits, and alternatives to the procedure were explained, and the patient elected to proceed.  PROCEDURE IN DETAIL: Surgical site was marked by myself in the pre-op holding area. Once inside the operating room, spinal anesthesia was obtained, and a foley catheter was inserted. The patient was then positioned on the Hana table.  All bony prominences were well padded.  The hip was prepped and draped in the normal sterile surgical fashion.  A time-out was called verifying side and site of surgery. The patient received IV antibiotics within 60 minutes of beginning the procedure.   Bikini incision was made, and superficial dissection was performed lateral to the ASIS. The direct anterior approach to the hip was performed through the Hueter interval.  Lateral femoral circumflex vessels were treated with the Auqumantys. The anterior capsule was exposed and an inverted T capsulotomy was made. The femoral neck cut was made to the level of the templated cut.  A corkscrew was placed into the head and the head was removed.  The femoral head was found to have eburnated  bone. The head was passed to the back table and was measured. Pubofemoral ligament was released off of the calcar, taking care to stay on bone. Superior capsule was released from the greater trochanter, taking care to stay lateral to the posterior border of the femoral neck in order to preserve the short external rotators.   Acetabular exposure was achieved, and the pulvinar and labrum were excised. Sequential reaming of the acetabulum was then performed up to a size 59 mm reamer. A 60 mm cup was then opened and impacted into place at approximately 40 degrees of abduction and 20 degrees of anteversion. The final polyethylene liner was impacted into place and acetabular osteophytes were removed.    I then gained femoral exposure taking care to protect the abductors and greater trochanter.  This was performed using standard external rotation, extension, and adduction.  A cookie cutter was used to enter the femoral canal, and then the femoral canal finder was placed.  Sequential broaching was performed up to a size 16.  Calcar planer was used on the femoral neck remnant.  I placed a high offset neck and a trial head ball.  The hip was reduced.  Leg lengths and offset were checked fluoroscopically.  The hip was dislocated and trial components were removed.  The final implants were placed, and the hip was reduced.  Fluoroscopy was used to confirm component position and leg lengths.  At 90 degrees of external rotation and full extension, the hip was stable to an anterior directed force.   The wound was copiously irrigated with Prontosan solution and normal saline using pulse lavage.  Marcaine solution was injected into the periarticular soft tissue.  The wound was closed in layers using #1 Vicryl and V-Loc for the fascia, 2-0 Vicryl for the subcutaneous fat, 2-0 Monocryl for the deep dermal layer, 3-0 running Monocryl subcuticular stitch, and Dermabond for the skin.  Once the glue was fully dried, an Aquacell Ag  dressing was applied.  The patient was transported to the recovery room in stable condition.  Sponge, needle, and instrument counts were correct at the end of the case x2.  The patient tolerated the procedure well and there were no known complications.  The aquamantis was utilized for this case to help facilitate better hemostasis as patient was felt to be at increased risk of bleeding because of complex case requiring increased OR time and/or exposure.  A oscillating saw tip was utilized for this case to prevent damage to the soft tissue structures such as muscles, ligaments and tendons, and to ensure accurate bone cuts. This patient was at increased risk for above structures due to  minimally invasive approach.  Please note that a surgical assistant was a medical necessity for this procedure to perform it in a safe and expeditious manner. Assistant was necessary to provide appropriate retraction of vital neurovascular structures, to prevent femoral fracture, and to allow for anatomic placement of the prosthesis.

## 2022-09-25 NOTE — Discharge Instructions (Signed)

## 2022-09-25 NOTE — Transfer of Care (Signed)
Immediate Anesthesia Transfer of Care Note  Patient: [name]  Procedure(s) Performed: Procedure(s) with comments: RIGHT TOTAL HIP ARTHROPLASTY ANTERIOR APPROACH (Right) - 130  Patient Location: PACU  Anesthesia Type:Spinal  Level of Consciousness:  sedated, patient cooperative and responds to stimulation  Airway & Oxygen Therapy:Patient Spontanous Breathing and Patient connected to face mask oxgen  Post-op Assessment:  Report given to PACU RN and Post -op Vital signs reviewed and stable  Post vital signs:  Reviewed and stable  Last Vitals:  Vitals:   09/25/22 0953  BP: (!) 132/94  Pulse: 98  Resp: 20  Temp: 36.8 C  SpO2: 93%    Complications: No apparent anesthesia complications

## 2022-09-26 ENCOUNTER — Encounter (HOSPITAL_COMMUNITY): Payer: Self-pay | Admitting: Orthopedic Surgery

## 2022-09-26 DIAGNOSIS — M1611 Unilateral primary osteoarthritis, right hip: Secondary | ICD-10-CM | POA: Diagnosis not present

## 2022-09-26 LAB — CBC
HCT: 42.2 % (ref 39.0–52.0)
Hemoglobin: 13.5 g/dL (ref 13.0–17.0)
MCH: 27.1 pg (ref 26.0–34.0)
MCHC: 32 g/dL (ref 30.0–36.0)
MCV: 84.7 fL (ref 80.0–100.0)
Platelets: 216 10*3/uL (ref 150–400)
RBC: 4.98 MIL/uL (ref 4.22–5.81)
RDW: 13.8 % (ref 11.5–15.5)
WBC: 12.4 10*3/uL — ABNORMAL HIGH (ref 4.0–10.5)
nRBC: 0 % (ref 0.0–0.2)

## 2022-09-26 LAB — BASIC METABOLIC PANEL
Anion gap: 7 (ref 5–15)
BUN: 25 mg/dL — ABNORMAL HIGH (ref 8–23)
CO2: 24 mmol/L (ref 22–32)
Calcium: 8.4 mg/dL — ABNORMAL LOW (ref 8.9–10.3)
Chloride: 102 mmol/L (ref 98–111)
Creatinine, Ser: 0.8 mg/dL (ref 0.61–1.24)
GFR, Estimated: >60 mL/min (ref >60)
Glucose, Bld: 138 mg/dL — ABNORMAL HIGH (ref 70–99)
Potassium: 4.2 mmol/L (ref 3.5–5.1)
Sodium: 133 mmol/L — ABNORMAL LOW (ref 135–145)

## 2022-09-26 MED ORDER — ONDANSETRON HCL 4 MG PO TABS: 4.0000 mg | ORAL_TABLET | Freq: Three times a day (TID) | ORAL | 0 refills | Status: AC | PRN

## 2022-09-26 MED ORDER — DOCUSATE SODIUM 100 MG PO CAPS: 100.0000 mg | ORAL_CAPSULE | Freq: Two times a day (BID) | ORAL | 0 refills | Status: AC

## 2022-09-26 MED ORDER — POLYETHYLENE GLYCOL 3350 17 G PO PACK: 17.0000 g | PACK | Freq: Every day | ORAL | 0 refills | Status: AC | PRN

## 2022-09-26 MED ORDER — ASPIRIN 81 MG PO CHEW: 81.0000 mg | CHEWABLE_TABLET | Freq: Two times a day (BID) | ORAL | 0 refills | Status: AC

## 2022-09-26 MED ORDER — HYDROCODONE-ACETAMINOPHEN 5-325 MG PO TABS: 1.0000 | ORAL_TABLET | ORAL | 0 refills | Status: AC | PRN

## 2022-09-26 MED ORDER — SENNA 8.6 MG PO TABS: 2.0000 | ORAL_TABLET | Freq: Every day | ORAL | 0 refills | Status: AC

## 2022-09-26 MED ORDER — ACETAMINOPHEN 500 MG PO TABS: 1000.0000 mg | ORAL_TABLET | Freq: Three times a day (TID) | ORAL | 0 refills | Status: AC | PRN

## 2022-09-26 NOTE — Progress Notes (Signed)
Physical Therapy Treatment Patient Details Name: [name] MRN: [id number] DOB: Feb 03, 1956 Today's Date: 09/26/2022   History of Present Illness 67 yo male presents to therapy s/p R THA, anterior approach on 09/25/2022 due to failure of conservative measures. Pt PMH includes but is not limited to: PD, deep brain stimulator in situ, OSA on CPAP, kidney stones,  and hernia repairs.    PT Comments  The patient has had sinemet  30" prior.  Patient required mod support to move to sitting, wife present.  Patient reports that he has a lift chair that he can sleep in initially.   The patient did ambulated x 50' using Rw, able to reciprocate  gait, mildly shuffling. Did have more difficulty backing up to recliner and lost balance. Will practice steps soon.   If plan is discharge home, recommend the following: A little help with walking and/or transfers;A little help with bathing/dressing/bathroom;Assistance with cooking/housework;Assist for transportation;Help with stairs or ramp for entrance   Can travel by private vehicle        Equipment Recommendations  None recommended by PT    Recommendations for Other Services       Precautions / Restrictions Precautions Precautions: Fall Precaution Comments: sinemet every 3.5 hrs Restrictions Weight Bearing Restrictions: No     Mobility  Bed Mobility   Bed Mobility: Supine to Sit     Supine to sit: Mod assist, HOB elevated, Used rails     General bed mobility comments: increased time, cues and mod A for R LE and trunk, used belt but still required assistance, wife present to assist.    Transfers Overall transfer level: Needs assistance Equipment used: Rolling walker (2 wheels) Transfers: Sit to/from Stand Sit to Stand: Min assist, From elevated surface           General transfer comment: cues and increased time    Ambulation/Gait Ambulation/Gait assistance: Contact guard assist Gait Distance (Feet): 50 Feet Assistive  device: Rolling walker (2 wheels) Gait Pattern/deviations: Step-to pattern, Trunk flexed Gait velocity: decreased     General Gait Details: cues for sequence,   Stairs             Wheelchair Mobility     Tilt Bed    Modified Rankin (Stroke Patients Only)       Balance Overall balance assessment: Needs assistance Sitting-balance support: Feet supported Sitting balance-Leahy Scale: Fair     Standing balance support: Bilateral upper extremity supported, During functional activity, Reliant on assistive device for balance Standing balance-Leahy Scale: Poor Standing balance comment: reliant on Rw, did lose balance backward when stepping back to recliner, L foot did not move as expected                            Cognition Arousal: Alert Behavior During Therapy: WFL for tasks assessed/performed Overall Cognitive Status: Within Functional Limits for tasks assessed                                          Exercises Total Joint Exercises Ankle Circles/Pumps: AROM, Both Heel Slides: AAROM, Left, 10 reps Hip ABduction/ADduction: AAROM, Left, 10 reps Long Arc Quad: AROM, Left, 10 reps    General Comments        Pertinent Vitals/Pain Pain Assessment Pain Score: 3  Pain Location: R hip and LBP, buttocks Pain Descriptors /  Indicators: Aching, Constant, Discomfort, Operative site guarding Pain Intervention(s): Monitored during session, Patient requesting pain meds-RN notified, Ice applied    Home Living                          Prior Function            PT Goals (current goals can now be found in the care plan section) Progress towards PT goals: Progressing toward goals    Frequency    7X/week      PT Plan      Co-evaluation              AM-PAC PT "6 Clicks" Mobility   Outcome Measure  Help needed turning from your back to your side while in a flat bed without using bedrails?: A Lot Help needed moving  from lying on your back to sitting on the side of a flat bed without using bedrails?: A Little Help needed moving to and from a bed to a chair (including a wheelchair)?: A Little Help needed standing up from a chair using your arms (e.g., wheelchair or bedside chair)?: A Little Help needed to walk in hospital room?: A Little Help needed climbing 3-5 steps with a railing? : A Lot 6 Click Score: 16    End of Session Equipment Utilized During Treatment: Gait belt Activity Tolerance: Patient tolerated treatment well Patient left: in chair;with call bell/phone within reach;with family/visitor present Nurse Communication: Mobility status PT Visit Diagnosis: Unsteadiness on feet (R26.81);Other abnormalities of gait and mobility (R26.89);Muscle weakness (generalized) (M62.81);Difficulty in walking, not elsewhere classified (R26.2);Pain Pain - Right/Left: Right Pain - part of body: Hip;Leg     Time: 7846-9629 PT Time Calculation (min) (ACUTE ONLY): 23 min  Charges:    $Gait Training: 8-22 mins $Therapeutic Exercise: 8-22 mins PT General Charges $$ ACUTE PT VISIT: 1 Visit                     Blanchard Kelch PT Acute Rehabilitation Services Office [phone] Weekend pager-[phone]    [name] 09/26/2022, 1:28 PM

## 2022-09-26 NOTE — Discharge Summary (Signed)
Physician Discharge Summary  Patient ID: [name] MRN: [id number] DOB/AGE: 65-04-1955 65 y.o.  Admit date: 09/25/2022 Discharge date: 09/26/2022  Admission Diagnoses:  Primary osteoarthritis of right hip  Discharge Diagnoses:  Principal Problem:   Primary osteoarthritis of right hip Active Problems:   S/P total right hip arthroplasty   Past Medical History:  Diagnosis Date   Arthritis    History of kidney stones 04/14/2019   surgery to remove   Lesion of ulnar nerve    no current problems   Parkinson's disease    S/P deep brain stimulator    [employer] [phone], nondirectional lead   Sleep apnea    occasional uses cpap - setting at 7    Surgeries: Procedure(s): RIGHT TOTAL HIP ARTHROPLASTY ANTERIOR APPROACH on 09/25/2022   Consultants (if any):   Discharged Condition: Improved  Hospital Course: [name] is an 67 y.o. male who was admitted 09/25/2022 with a diagnosis of Primary osteoarthritis of right hip and went to the operating room on 09/25/2022 and underwent the above named procedures.    He was given perioperative antibiotics:  Anti-infectives (From admission, onward)    Start     Dose/Rate Route Frequency Ordered Stop   09/25/22 1830  ceFAZolin (ANCEF) IVPB 2g/100 mL premix        2 g 200 mL/hr over 30 Minutes Intravenous Every 6 hours 09/25/22 1611 09/26/22 0015   09/25/22 1000  ceFAZolin (ANCEF) IVPB 3g/100 mL premix        3 g 200 mL/hr over 30 Minutes Intravenous On call to O.R. 09/25/22 0956 09/25/22 1228       He was given sequential compression devices, early ambulation, and aspirin for DVT prophylaxis.  POD#1 He ambulated well with PT. Discharged home with HEP.   He benefited maximally from the hospital stay and there were no complications.    Recent vital signs:  Vitals:   09/26/22 0112 09/26/22 0520  BP: 122/81 118/76  Pulse: 74 66  Resp: 18 16  Temp: 97.7 F (36.5 C) 98 F (36.7 C)  SpO2: 97% 96%    Recent  laboratory studies:  Lab Results  Component Value Date   HGB 13.5 09/26/2022   HGB 14.8 09/13/2022   HGB 14.2 06/06/2021   Lab Results  Component Value Date   WBC 12.4 (H) 09/26/2022   PLT 216 09/26/2022   No results found for: "INR" Lab Results  Component Value Date   NA 133 (L) 09/26/2022   K 4.2 09/26/2022   CL 102 09/26/2022   CO2 24 09/26/2022   BUN 25 (H) 09/26/2022   CREATININE 0.80 09/26/2022   GLUCOSE 138 (H) 09/26/2022     Allergies as of 09/26/2022       Reactions   Ropinirole Hcl Nausea Only   Turmeric Itching        Medication List     STOP taking these medications    rasagiline 1 MG Tabs tablet Commonly known as: AZILECT       TAKE these medications    acetaminophen 500 MG tablet Commonly known as: TYLENOL Take 2 tablets (1,000 mg total) by mouth every 8 (eight) hours as needed for moderate pain, mild pain, fever or headache. What changed:  when to take this reasons to take this   aspirin 81 MG chewable tablet Commonly known as: Aspirin Childrens Chew 1 tablet (81 mg total) by mouth 2 (two) times daily with a meal.   calcium carbonate 750 MG chewable  tablet Commonly known as: TUMS EX Chew 750 mg by mouth daily as needed for heartburn.   carbidopa-levodopa 25-100 MG tablet Commonly known as: SINEMET IR Take 1.5 tablets by mouth 4 (four) times daily. At 8, 12, 4 PM and 8 PM What changed:  when to take this additional instructions   docusate sodium 100 MG capsule Commonly known as: Colace Take 1 capsule (100 mg total) by mouth 2 (two) times daily.   HYDROcodone-acetaminophen 5-325 MG tablet Commonly known as: NORCO/VICODIN Take 1 tablet by mouth every 4 (four) hours as needed for up to 7 days for moderate pain or severe pain.   hydrocortisone cream 1 % Apply 1 Application topically 2 (two) times daily as needed for itching.   LUBRICATING EYE DROPS OP Place 1 drop into the left eye daily.   meloxicam 15 MG tablet Commonly  known as: MOBIC Take 15 mg by mouth daily.   Neupro 4 MG/24HR Generic drug: rotigotine APPLY 1 PATCH ONTO THE SKIN EVERY DAY   ondansetron 4 MG tablet Commonly known as: Zofran Take 1 tablet (4 mg total) by mouth every 8 (eight) hours as needed for nausea or vomiting.   polyethylene glycol 17 g packet Commonly known as: MiraLax Take 17 g by mouth daily as needed for mild constipation or moderate constipation.   senna 8.6 MG Tabs tablet Commonly known as: SENOKOT Take 2 tablets (17.2 mg total) by mouth at bedtime for 15 days.               Discharge Care Instructions  (From admission, onward)           Start     Ordered   09/26/22 0000  Weight bearing as tolerated        09/26/22 0749   09/26/22 0000  Change dressing       Comments: Do not change your dressing.   09/26/22 0749              WEIGHT BEARING   Weight bearing as tolerated with assist device (walker, cane, etc) as directed, use it as long as suggested by your surgeon or therapist, typically at least 4-6 weeks.   EXERCISES  Results after joint replacement surgery are often greatly improved when you follow the exercise, range of motion and muscle strengthening exercises prescribed by your doctor. Safety measures are also important to protect the joint from further injury. Any time any of these exercises cause you to have increased pain or swelling, decrease what you are doing until you are comfortable again and then slowly increase them. If you have problems or questions, call your caregiver or physical therapist for advice.   Rehabilitation is important following a joint replacement. After just a few days of immobilization, the muscles of the leg can become weakened and shrink (atrophy).  These exercises are designed to build up the tone and strength of the thigh and leg muscles and to improve motion. Often times heat used for twenty to thirty minutes before working out will loosen up your tissues and  help with improving the range of motion but do not use heat for the first two weeks following surgery (sometimes heat can increase post-operative swelling).   These exercises can be done on a training (exercise) mat, on the floor, on a table or on a bed. Use whatever works the best and is most comfortable for you.    Use music or television while you are exercising so that the exercises are a  pleasant break in your day. This will make your life better with the exercises acting as a break in your routine that you can look forward to.   Perform all exercises about fifteen times, three times per day or as directed.  You should exercise both the operative leg and the other leg as well.  Exercises include:   Quad Sets - Tighten up the muscle on the front of the thigh (Quad) and hold for 5-10 seconds.   Straight Leg Raises - With your knee straight (if you were given a brace, keep it on), lift the leg to 60 degrees, hold for 3 seconds, and slowly lower the leg.  Perform this exercise against resistance later as your leg gets stronger.  Leg Slides: Lying on your back, slowly slide your foot toward your buttocks, bending your knee up off the floor (only go as far as is comfortable). Then slowly slide your foot back down until your leg is flat on the floor again.  Angel Wings: Lying on your back spread your legs to the side as far apart as you can without causing discomfort.  Hamstring Strength:  Lying on your back, push your heel against the floor with your leg straight by tightening up the muscles of your buttocks.  Repeat, but this time bend your knee to a comfortable angle, and push your heel against the floor.  You may put a pillow under the heel to make it more comfortable if necessary.   A rehabilitation program following joint replacement surgery can speed recovery and prevent re-injury in the future due to weakened muscles. Contact your doctor or a physical therapist for more information on knee  rehabilitation.    CONSTIPATION  Constipation is defined medically as fewer than three stools per week and severe constipation as less than one stool per week.  Even if you have a regular bowel pattern at home, your normal regimen is likely to be disrupted due to multiple reasons following surgery.  Combination of anesthesia, postoperative narcotics, change in appetite and fluid intake all can affect your bowels.   YOU MUST use at least one of the following options; they are listed in order of increasing strength to get the job done.  They are all available over the counter, and you may need to use some, POSSIBLY even all of these options:    Drink plenty of fluids (prune juice may be helpful) and high fiber foods Colace 100 mg by mouth twice a day  Senokot for constipation as directed and as needed Dulcolax (bisacodyl), take with full glass of water  Miralax (polyethylene glycol) once or twice a day as needed.  If you have tried all these things and are unable to have a bowel movement in the first 3-4 days after surgery call either your surgeon or your primary doctor.    If you experience loose stools or diarrhea, hold the medications until you stool forms back up.  If your symptoms do not get better within 1 week or if they get worse, check with your doctor.  If you experience "the worst abdominal pain ever" or develop nausea or vomiting, please contact the office immediately for further recommendations for treatment.   ITCHING:  If you experience itching with your medications, try taking only a single pain pill, or even half a pain pill at a time.  You can also use Benadryl over the counter for itching or also to help with sleep.   TED HOSE STOCKINGS:  Use  stockings on both legs until for at least 2 weeks or as directed by physician office. They may be removed at night for sleeping.  MEDICATIONS:  See your medication summary on the "After Visit Summary" that nursing will review with you.   You may have some home medications which will be placed on hold until you complete the course of blood thinner medication.  It is important for you to complete the blood thinner medication as prescribed.  PRECAUTIONS:  If you experience chest pain or shortness of breath - call 911 immediately for transfer to the hospital emergency department.   If you develop a fever greater that 101 F, purulent drainage from wound, increased redness or drainage from wound, foul odor from the wound/dressing, or calf pain - CONTACT YOUR SURGEON.                                                   FOLLOW-UP APPOINTMENTS:  If you do not already have a post-op appointment, please call the office for an appointment to be seen by your surgeon.  Guidelines for how soon to be seen are listed in your "After Visit Summary", but are typically between 1-4 weeks after surgery.  OTHER INSTRUCTIONS:   Knee Replacement:  Do not place pillow under knee, focus on keeping the knee straight while resting. CPM instructions: 0-90 degrees, 2 hours in the morning, 2 hours in the afternoon, and 2 hours in the evening. Place foam block, curve side up under heel at all times except when in CPM or when walking.  DO NOT modify, tear, cut, or change the foam block in any way.   MAKE SURE YOU:  Understand these instructions.  Get help right away if you are not doing well or get worse.    Thank you for letting us be a part of your medical care team.  It is a privilege we respect greatly.  We hope these instructions will help you stay on track for a fast and full recovery!   Diagnostic Studies: DG HIP UNILAT WITH PELVIS 1V RIGHT  Result Date: 09/25/2022 CLINICAL DATA:  Elective surgery. EXAM: DG HIP (WITH OR WITHOUT PELVIS) 1V RIGHT COMPARISON:  None Available. FINDINGS: Eight fluoroscopic spot views of the pelvis and right hip obtained in the operating room. Sequential images during hip arthroplasty. Fluoroscopy time 10 seconds. Dose 3.0508  mGy. IMPRESSION: Intraoperative fluoroscopy for right hip arthroplasty. Electronically Signed   By: Narda Rutherford M.D.   On: 09/25/2022 15:41   DG Pelvis Portable  Result Date: 09/25/2022 CLINICAL DATA:  Post hip arthroplasty. EXAM: PORTABLE PELVIS 1-2 VIEWS COMPARISON:  None Available. FINDINGS: Right hip arthroplasty in expected alignment. No periprosthetic lucency or fracture. Recent postsurgical change includes air and edema in the soft tissues. Lateral skin staples in place. IMPRESSION: Right hip arthroplasty without immediate postoperative complication. Electronically Signed   By: Narda Rutherford M.D.   On: 09/25/2022 15:40   DG C-Arm 1-60 Min-No Report  Result Date: 09/25/2022 Fluoroscopy was utilized by the requesting physician.  No radiographic interpretation.   DG C-Arm 1-60 Min-No Report  Result Date: 09/25/2022 Fluoroscopy was utilized by the requesting physician.  No radiographic interpretation.    Disposition: Discharge disposition: 01-Home or Self Care       Discharge Instructions     Call MD / Call 911  Complete by: As directed    If you experience chest pain or shortness of breath, CALL 911 and be transported to the hospital emergency room.  If you develope a fever above 101 F, pus (white drainage) or increased drainage or redness at the wound, or calf pain, call your surgeon's office.   Change dressing   Complete by: As directed    Do not change your dressing.   Constipation Prevention   Complete by: As directed    Drink plenty of fluids.  Prune juice may be helpful.  You may use a stool softener, such as Colace (over the counter) 100 mg twice a day.  Use MiraLax (over the counter) for constipation as needed.   Diet - low sodium heart healthy   Complete by: As directed    Discharge instructions   Complete by: As directed    Elevate toes above nose. Use cryotherapy as needed for pain and swelling.   Driving restrictions   Complete by: As directed    No  driving for 6 weeks   Increase activity slowly as tolerated   Complete by: As directed    Lifting restrictions   Complete by: As directed    No lifting for 6 weeks   Post-operative opioid taper instructions:   Complete by: As directed    POST-OPERATIVE OPIOID TAPER INSTRUCTIONS: It is important to wean off of your opioid medication as soon as possible. If you do not need pain medication after your surgery it is ok to stop day one. Opioids include: Codeine, Hydrocodone(Norco, Vicodin), Oxycodone(Percocet, oxycontin) and hydromorphone amongst others.  Long term and even short term use of opiods can cause: Increased pain response Dependence Constipation Depression Respiratory depression And more.  Withdrawal symptoms can include Flu like symptoms Nausea, vomiting And more Techniques to manage these symptoms Hydrate well Eat regular healthy meals Stay active Use relaxation techniques(deep breathing, meditating, yoga) Do Not substitute Alcohol to help with tapering If you have been on opioids for less than two weeks and do not have pain than it is ok to stop all together.  Plan to wean off of opioids This plan should start within one week post op of your joint replacement. Maintain the same interval or time between taking each dose and first decrease the dose.  Cut the total daily intake of opioids by one tablet each day Next start to increase the time between doses. The last dose that should be eliminated is the evening dose.      TED hose   Complete by: As directed    Use stockings (TED hose) for 2 weeks on both leg(s).  You may remove them at night for sleeping.   Weight bearing as tolerated   Complete by: As directed         Follow-up Information     Clois Dupes, PA-C. Schedule an appointment as soon as possible for a visit in 2 week(s).   Specialty: Orthopedic Surgery Why: For suture removal, For wound re-check Contact information: [address], [address] [place] [address] [phone]                  Signed: Clois Dupes 09/26/2022, 7:56 AM

## 2022-09-26 NOTE — Progress Notes (Addendum)
Physical Therapy Treatment Patient Details Name: [name] MRN: [id number] DOB: January 29, 1956 Today's Date: 09/26/2022   History of Present Illness 67 yo male presents to therapy s/p R THA, anterior approach on 09/25/2022 due to failure of conservative measures. Pt PMH includes but is not limited to: PD, deep brain stimulator in situ, OSA on CPAP, kidney stones,  and hernia repairs.    PT Comments  The patient able to negotiate steps safely with wife assisting. Patient has met PT goals for DC .    If plan is discharge home, recommend the following: A little help with walking and/or transfers;A little help with bathing/dressing/bathroom;Assistance with cooking/housework;Assist for transportation;Help with stairs or ramp for entrance   Can travel by private vehicle        Equipment Recommendations  None recommended by PT    Recommendations for Other Services       Precautions / Restrictions Precautions Precautions: Fall Precaution Comments: sinemet every 3.5 hrs Restrictions Weight Bearing Restrictions: No     Mobility  Bed Mobility   Bed Mobility: Supine to Sit     Supine to sit: Mod assist, HOB elevated, Used rails     General bed mobility comments: in recliner,    Transfers Overall transfer level: Needs assistance Equipment used: Rolling walker (2 wheels) Transfers: Sit to/from Stand Sit to Stand: Contact guard assist           General transfer comment: cues for ahnd palcement    Ambulation/Gait Ambulation/Gait assistance: Contact guard assist Gait Distance (Feet): 60 Feet Assistive device: Rolling walker (2 wheels) Gait Pattern/deviations: Step-to pattern, Trunk flexed Gait velocity: decreased     General Gait Details: cues for sequence,   Stairs Stairs: Yes Stairs assistance: Min assist Stair Management: One rail Left, Step to pattern, Forwards Number of Stairs: 2 General stair comments: cues for sequence, wife present   Wheelchair  Mobility     Tilt Bed    Modified Rankin (Stroke Patients Only)       Balance Overall balance assessment: Needs assistance Sitting-balance support: Feet supported Sitting balance-Leahy Scale: Fair     Standing balance support: Bilateral upper extremity supported, During functional activity, Reliant on assistive device for balance Standing balance-Leahy Scale: Poor Standing balance comment: reliant on Rw, did lose balance backward when stepping back to recliner, L foot did not move as expected                            Cognition Arousal: Alert Behavior During Therapy: WFL for tasks assessed/performed Overall Cognitive Status: Within Functional Limits for tasks assessed                                          Exercises   General Comments        Pertinent Vitals/Pain Pain Assessment Pain Score: 3  Pain Location: R hip and LBP, buttocks Pain Descriptors / Indicators: Sore Pain Intervention(s): Monitored during session, Premedicated before session    Home Living                          Prior Function            PT Goals (current goals can now be found in the care plan section) Progress towards PT goals: Progressing toward goals    Frequency  7X/week      PT Plan      Co-evaluation              AM-PAC PT "6 Clicks" Mobility   Outcome Measure  Help needed turning from your back to your side while in a flat bed without using bedrails?: A Lot Help needed moving from lying on your back to sitting on the side of a flat bed without using bedrails?: A Little Help needed moving to and from a bed to a chair (including a wheelchair)?: A Little Help needed standing up from a chair using your arms (e.g., wheelchair or bedside chair)?: A Little Help needed to walk in hospital room?: A Little Help needed climbing 3-5 steps with a railing? : A Little 6 Click Score: 17    End of Session Equipment Utilized During  Treatment: Gait belt Activity Tolerance: Patient tolerated treatment well Patient left: in chair;with call bell/phone within reach;with family/visitor present Nurse Communication: Mobility status PT Visit Diagnosis: Unsteadiness on feet (R26.81);Other abnormalities of gait and mobility (R26.89);Muscle weakness (generalized) (M62.81);Difficulty in walking, not elsewhere classified (R26.2);Pain Pain - Right/Left: Right Pain - part of body: Hip;Leg     Time: 6295-2841 PT Time Calculation (min) (ACUTE ONLY): 24 min  Charges:    $Gait Training: 23-37 mins $ PT General Charges $$ ACUTE PT VISIT: 1 Visit                     [name] PT Acute Rehabilitation Services Office [phone] Weekend pager-[phone]    [name] 09/26/2022, 1:36 PM

## 2022-09-26 NOTE — TOC Transition Note (Signed)
Transition of Care [facility]) - CM/SW Discharge Note   Patient Details  Name: [name] MRN: [id number] Date of Birth: 01/25/1956  [facility] CM/SW Contact:  Amada Jupiter, LCSW Phone Number: 09/26/2022, 9:46 AM   Clinical Narrative:     Met with pt who confirms he has needed DME in the home.  Plan for HEP.  No TOC needs.  Final next level of care: Home/Self Care Barriers to Discharge: No Barriers Identified   Patient Goals and CMS Choice      Discharge Placement                         Discharge Plan and Services Additional resources added to the After Visit Summary for                  DME Arranged: N/A DME Agency: NA                  Social Determinants of Health (SDOH) Interventions SDOH Screenings   Food Insecurity: No Food Insecurity (09/25/2022)  Housing: Low Risk  (09/25/2022)  Transportation Needs: No Transportation Needs (09/25/2022)  Utilities: Not At Risk (09/25/2022)  Tobacco Use: Low Risk  (09/25/2022)     Readmission Risk Interventions     No data to display

## 2022-09-26 NOTE — Progress Notes (Signed)
    Subjective:  Patient reports pain as mild to moderate.  Denies N/V/CP/SOB/Abd pain. He denies any tingling or numbness in LE bilaterally. He reports he did well overnight. He is eager for d/c home.   We discussed not to restart Azilect until after he stops narcotic pain medications. Discussed neurology is hoping for 3 days PO.   Objective:   VITALS:   Vitals:   09/25/22 2112 09/26/22 0100 09/26/22 0112 09/26/22 0520  BP: 116/73 122/81 122/81 118/76  Pulse: 91 78 74 66  Resp: 16 17 18 16   Temp: 97.7 F (36.5 C) 97.7 F (36.5 C) 97.7 F (36.5 C) 98 F (36.7 C)  TempSrc: Oral Oral Oral Oral  SpO2: 95% 98% 97% 96%  Weight:      Height:        Patient is lying comfortably in bed. NAD Neurologically intact ABD soft Neurovascular intact Sensation intact distally Intact pulses distally Dorsiflexion/Plantar flexion intact Incision: dressing C/D/I No cellulitis present Compartment soft   Lab Results  Component Value Date   WBC 12.4 (H) 09/26/2022   HGB 13.5 09/26/2022   HCT 42.2 09/26/2022   MCV 84.7 09/26/2022   PLT 216 09/26/2022   BMET    Component Value Date/Time   NA 133 (L) 09/26/2022 0311   K 4.2 09/26/2022 0311   CL 102 09/26/2022 0311   CO2 24 09/26/2022 0311   GLUCOSE 138 (H) 09/26/2022 0311   BUN 25 (H) 09/26/2022 0311   CREATININE 0.80 09/26/2022 0311   CALCIUM 8.4 (L) 09/26/2022 0311   GFRNONAA >60 09/26/2022 0311     Assessment/Plan: 1 Day Post-Op   Principal Problem:   Primary osteoarthritis of right hip Active Problems:   S/P total right hip arthroplasty   WBAT with walker DVT ppx: Aspirin, SCDs, TEDS PO pain control PT/OT: Patient ambulated 12 feet with PT yesterday. Continue PT today.  Dispo: D/c home with HEP once cleared with PT and voided.    Clois Dupes, PA-C 09/26/2022, 7:02 AM   [facility]  [place] [address], [place], [place] [address] Phone:  [phone] [email or web address] Facebook  [employer]

## 2022-09-27 NOTE — Anesthesia Postprocedure Evaluation (Signed)
Anesthesia Post Note  Patient: BAUTISTA SPIEGLER  Procedure(s) Performed: RIGHT TOTAL HIP ARTHROPLASTY ANTERIOR APPROACH (Right: Hip)     Patient location during evaluation: PACU Anesthesia Type: Spinal Level of consciousness: oriented and awake and alert Pain management: pain level controlled Vital Signs Assessment: post-procedure vital signs reviewed and stable Respiratory status: spontaneous breathing, respiratory function stable and patient connected to nasal cannula oxygen Cardiovascular status: blood pressure returned to baseline and stable Postop Assessment: no headache, no backache and no apparent nausea or vomiting Anesthetic complications: no  No notable events documented.  Last Vitals:  Vitals:   09/26/22 0520 09/26/22 1025  BP: 118/76 130/80  Pulse: 66 78  Resp: 16 18  Temp: 36.7 C 36.5 C  SpO2: 96% 100%    Last Pain:  Vitals:   09/26/22 0716  TempSrc:   PainSc: 3                  Randie Bloodgood S

## 2022-10-17 ENCOUNTER — Ambulatory Visit: Payer: Managed Care, Other (non HMO) | Admitting: Neurology

## 2022-11-18 ENCOUNTER — Encounter: Payer: Self-pay | Admitting: Neurology

## 2022-11-18 MED ORDER — NEUPRO 4 MG/24HR TD PT24: MEDICATED_PATCH | TRANSDERMAL | 0 refills | Status: DC

## 2022-11-19 MED ORDER — RASAGILINE MESYLATE 1 MG PO TABS: 1.0000 mg | ORAL_TABLET | Freq: Every day | ORAL | 0 refills | Status: DC

## 2022-12-10 NOTE — Telephone Encounter (Signed)
LMVM for pt on phone, that we have not filled since 2021.  He has appt 12-23-2022 at [facility], they most likely have been filling the sinemet for him.  He is to contact us if needed. We have filled azilect and neupro patches.

## 2022-12-25 ENCOUNTER — Encounter: Payer: Self-pay | Admitting: Neurology

## 2022-12-25 ENCOUNTER — Ambulatory Visit: Payer: Managed Care, Other (non HMO) | Admitting: Neurology

## 2022-12-25 VITALS — BP 121/78 | HR 77 | Ht 75.0 in | Wt 289.8 lb

## 2022-12-25 DIAGNOSIS — G20A2 Parkinson's disease without dyskinesia, with fluctuations: Secondary | ICD-10-CM | POA: Diagnosis not present

## 2022-12-25 DIAGNOSIS — M542 Cervicalgia: Secondary | ICD-10-CM

## 2022-12-25 DIAGNOSIS — R600 Localized edema: Secondary | ICD-10-CM

## 2022-12-25 DIAGNOSIS — R291 Meningismus: Secondary | ICD-10-CM

## 2022-12-25 DIAGNOSIS — Z96641 Presence of right artificial hip joint: Secondary | ICD-10-CM

## 2022-12-25 DIAGNOSIS — G4733 Obstructive sleep apnea (adult) (pediatric): Secondary | ICD-10-CM

## 2022-12-25 MED ORDER — BACLOFEN 10 MG PO TABS: 10.0000 mg | ORAL_TABLET | Freq: Three times a day (TID) | ORAL | 3 refills | Status: AC | PRN

## 2022-12-25 NOTE — Patient Instructions (Addendum)
It was nice to see you both again today.  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  I am happy to trial a muscle relaxer with you, as discussed, we will start you on low-dose baclofen 10 mg strength, take half a pill up to 3 times a day as needed, may increase to 1 full pill up to 3 times a day over the next few weeks.  I am worried about you taking high-dose ibuprofen for several days now.  Please scale back on ibuprofen, even over-the-counter medications can cause side effects including kidney damage, high blood pressure, swelling.  Please monitor your leg swelling, I do believe it has become worse.  Please look into using compression stockings again, you may find some that have zip closure and are comfortable enough to use and easier to put on.  You may want to look online.  Maintain your other medications including Azilect once daily, Neupro patch once daily, and levodopa 1-1/2 pills 5 times a day, 3-1/2 hourly as you have been.  Please start using your AutoPap machine again.  You have significant obstructive sleep apnea which can cause long-term cardiovascular disease.  From my end of things, I do not believe you need to continue with baby aspirin daily.  Follow-up in 6 months.

## 2022-12-25 NOTE — Progress Notes (Signed)
Subjective:    Patient ID: [name] is a 67 y.o. male.  HPI    Interim history:   [name] is a 67 year old right-handed gentleman with an underlying history of hyperlipidemia and kidney stones, s/p DBS for PD, arthritis with status post right total hip replacement in August 2024, OSA, not currently on PAP therapy, who presents for followup consultation of his right-sided predominant Parkinson's disease, with status post left DBS.  The patient is accompanied by his wife today.  I saw him on 01/10/2022, at which time we kept his medications the same.  He had a follow-up appointment with [facility] neurolog[facility] on 06/17/2022 and I reviewed the office visit note.  He was felt to be a good candidate for right DBS and advised to contact their office whenever he is ready for it.    Today, 12/25/2022: He reports his hip replacement.  He had developed quite significant discomfort on the right side, including knee pain, had received injections in to the right knee and eventually had a scan for his right hip and was determined to need hip replacement.  He had interim right total hip replacement on 09/25/2022.  He had finished physical therapy, he began to have neck stiffness and neck pain, we started physical therapy recently for neck pain.  He is no longer on any narcotic pain medication is stopped meloxicam but reports that in the past few days, less than a week, he has been taking a lot of ibuprofen over-the-counter, 200 mg strength up to 12 pills a day.  He will see Valentina Shaggy, PA at [facility] in January 2025.  He continues to take his stable doses of Sinemet generic, Azilect once daily, and his Neupro patch daily.  His lower extremity edema has worsened.  His wife has noticed his swelling as well.  In the past, he has tried compression stockings but they are very hard to put on.  He no longer uses his AutoPap consistently, 5 very uncomfortable, in reviewing his download for the past year, he  used his machine 56 days in the past year, no usage since mid June 2024.  He does not sleep well, he has discomfort sometimes gets out of bed, sometimes sleeps in the recliner, admits that sleep is not good currently.  Softener regularly.  He continues to take a baby aspirin, it was started after his hip surgery but he never stopped it.  He is wondering if he needs to continue with baby aspirin.  He does not have a family history of early cardiac death or severe heart disease or stroke.  Mom is alive in her early 52s.  His dad lived to be in his 43s.   The patient's allergies, current medications, family history, past medical history, past social history, past surgical history and problem list were reviewed and updated as appropriate.    Previously (copied from previous notes for reference):   I saw him on 07/11/2021, at which time he reported sciatica type symptoms.  He had right inguinal hernia surgery again in May 2023.  He had an emergency room visit in April 2023 for palpitations.  He was advised to continue with his medication regimen for Parkinson's disease.  He was advised to see orthopedics for his sciatica.   He saw Valentina Shaggy, PA at [facility] neurology in the interim on 09/10/2021, at which time he reported lumbar radiculopathy type symptoms to the right.  The DBS settings on the left side were changed  a little bit to see if it would help the right leg.  He was advised that his DBS was not MRI compatible in case he needed a scan for the lumbar spine.   I saw him on 11/08/2020 at which time he was on Sinemet to 1-1/2 pills 5 times a day. This was done in July '22, when he last saw Valentina Shaggy at [facility]. He had a swallow study on 10/11/2020, which showed small penetration with thin liquids, trace to mild residuals with pudding, no frank aspiration. He also had an upper GI and HIDA scan.  He was having quite a bit of GI issues.  He retired in June 2022. His older son moved to [place]. He  was using his AutoPap. He c/o right hip pain.  He will eventually need right foot surgery, he was planning to getting an opinion from Dr. Shelle Iron who came recommended.    I saw him on 03/13/2020, at which time he reported feeling fairly stable.  He was quite consistent with his AutoPap.  He was not quite ready for his second DBS, he had a follow-up with Dr. Rubin Payor in January 2022.  He had noticed more restless legs type symptoms.  He was advised to increase his gabapentin to up to 300 mg at bedtime.  We decided to keep his Sinemet at 1-1/2 pills 4 times a day, Neupro at 4 mg once daily and Azilect 1 mg once daily.     I saw him on 08/23/2019, at which time he reported that the programming change recently had helped, he had a recent appointment at [facility].  He was advised to continue with his Sinemet which was 1-1/2 pills 4 times a day although he had been advised to increase it to 5 times a day previously when he went to [facility].  He felt stable on the 4 times a day scheduling.  He was having more restless leg symptoms.  He was advised to start low-dose gabapentin 100 mg strength.    I reviewed the last 30 days, average usage was 5 hours and 20 minutes, date range was 02/12/2020 through 03/12/2020.  Average AHI 0.8/h, average pressure for the 95th percentile 8.3 cm, average leak for the 95th percentile 14.2 L/min.  Percent use days greater than 4 hours at 80% which is very good.         I saw him on 04/20/2019, at which time I suggested he increase his Sinemet.       I saw him on 12/21/18, at which time he was compliant with his AutoPap.  He reported doing okay with it.  He had ongoing issues with lower extremity swelling.  He was advised to exercise on a more consistent basis.  He had interim problems with hernia and reported that he may need surgery.  He ended up having right hydronephrosis and required a stent placement on 04/14/2019.    He had an interim appointment with [facility] for  programming on 12/30/2018 and a subsequent appointment with Dr. Westley Hummer on 01/20/2019 and I reviewed the office note.    I reviewed his AutoPap compliance data from 03/20/2019 through 04/18/2019 which is a total of 30 days, during which time he used his machine 27 days with percent use days greater than 4 hours at 83%, indicating very good compliance with an average usage of 5 hours and 35 minutes for days on treatment, residual AHI at goal at 0.5/h, 95th percentile pressure at 8.3 cm with  a range of 7 cm to 13 cm with EPR, leak acceptable with a 95th percentile at 10.3 L/min.     I saw him in a virtual visit on 07/30/2018, at which time he was compliant with his AutoPap.  I suggested we continue with his medication regimen including Azilect 1 mg once daily, Sinemet 1-1/2 pills 4 times a day and Neupro patch 4 mg daily.     I reviewed his AutoPap compliance data from 11/17/2018 through 12/16/2018 which is a total of 30 days, during which time he used his machine 27 days with percent use days greater than 4 hours at 80%, indicating very good compliance with an average usage of 5 hours and 58 minutes, residual AHI at goal at 0.7/h, leak high with a 95th percentile at 29.5 L/min on a pressure range of 7 cm to 13 cm with EPR, 95th percentile of pressure at 8.4 cm.       I saw him on 11/06/2017, at which time he felt stable from the Parkinson's standpoint.  He had recently seen Dr. Rubin Payor at [facility] in August and programming was kept the same.  His lower extremity swelling had gotten worse.     He emailed in December and in early January 2020 we reduced his Neupro patch from 6 mg to 4 mg daily.   He saw Darrol Angel, nurse practitioner in the interim on 03/31/2018 for his first AutoPap visit after his home sleep test and he was adequate with his AutoPap compliance at the time.    I reviewed his AutoPap compliance data from 06/29/2018 through 07/28/2018 which is a total of 30 days, during which time he used his  machine 26 days with percent used days greater than 4 hours at 77%, indicating good compliance with an average usage of 4 hours and 43 minutes only, residual AHI at goal at 1/h, 95th percentile of pressure at 8.3 cm, leak on the higher side with a 95th percentile at 18.3 L/min on a pressure range of 7 cm to 13 cm with EPR.    I saw him on 05/06/2017, at which time he was doing well, walking and tremor were since his first programming appointment on 04/18/2017. Of note, he had his left DBS surgery on 04/04/2017 under Dr. Angelyn Punt. He has had a very good experience overall from beginning to end.      I saw him on 11/05/2016, at which time he reported no significant repercussions after reducing the Neupro and increasing the Sinemet. His swelling had improved a little bit. He was still in the process of being evaluated for DBS surgery. I suggested we keep his medication regimen the same. We talked about the importance of losing weight especially in preparation for elective surgery.     I saw him on 07/04/2016, at which time he reported doing okay, was planning to pursue DBS surgery perhaps later this year. He was quite apprehensive. He had not scheduled his psychological evaluation or surgical consultation. He was overall considered a good candidate for DBS treatment per neurology. He was more on time with his Sinemet and noticed improvement in his symptoms, lower extremity swelling was slightly worse. Neupro was working well at 8 mg, but d/t swelling, I suggested with reduce it. I also suggested we increase his Sinemet to 1-1/2 pills for times a day in lieu of keeping the Neupro at 8 mg daily. He was no longer taking gabapentin and his foot pain was indeed a little bit better  with time, gabapentin made him too sleepy.    I saw him on 03/05/2016, at which time he reported taking C/L 4 times a day, but not always all 4 doses, as he would forget at times. Low-dose gabapentin prn was helping his foot pain. No  recent falls, no recent mood or memory issues, no word on the referral from [facility]. Had a recent cold and congestion. He was advised to continue with the current management, including Sinemet 1 pill 4 times a day, Azilect 1 mg once daily, Neupro patch 8 mg once daily, gabapentin 300 mg at night as needed.   I resubmitted referral to [facility] for DBS Eval and patient was seen in the interim by Dr. Rubin Payor on 05/24/16. I reviewed the note.     I saw him on 12/04/2015, at which time he reported ongoing issues with his right foot including status post surgery in April 2017 for hammertoes but he had ongoing issues with pain and more stiffness. He felt overall that his right side was more stiff and tremulous. Since starting Sinemet he had noted no telltale difference in his symptoms. He had consulted with different podiatrists and had tried orthotics. Mood and memory were stable. He was not sleeping very well. We talked about DBS treatment in more detail. I made a referral to Dr. Rubin Payor at [facility]. I also increased his Sinemet to 1 pill 4 times a day. I suggested we try him on low-dose gabapentin for his right foot pain.   I saw him on 08/01/2015, at which time he reported feeling worse. He had more difficulty exercising, he had gained weight. He had foot surgery and his balance had been worse since then. He had hammertoe surgery on 05/25/2015. Because of decrease in mobility after his surgery his weight increase. His tremor was worse. Memory was stable. I suggested we continue with Neupro 8 mg patch daily. I suggested he continue with Azilect once daily. I suggested we start him on the rytary 95 mg strength with gradual titration. However, he called in July reporting that the medication was too expensive, I therefore, switched him to Sinemet generic. He also requested a handicap placard form to be filled out which I provided in September.   Of note, he missed an appointment on 06/20/2015 due to being sick. I saw  him on 12/01/2014, at which time he reported a change in his insurance, mood and memory were stable, lower extremity swelling was stable, we kept his Neupro patch at 8 mg an Azilect 1 mg once daily the same. He was working full-time, also active with his 2 teenage boys, 65 year old an 11 year old. His wife had a recent change in her job.    I saw him on 08/02/2014, at which time he reported doing well. He had some leg swelling. His right hand swelling was a little worse. Cognitively and mood wise he was stable. Balance may have been off at times but generally speaking he was doing well.    I saw him on 04/05/2014, at which time he reported feeling fairly stable but his wife felt his balance was not as good. He was working out with a Psychologist, educational. He had picked up boxing. He was working full-time. He cut back on his sodas. I kept him on his medications, Neupro patch 6 mg and Azilect 1 mg. He had some ongoing issues with lower extremity swelling and I referred him to vascular surgery for consultation. He was seen by a vascular specialist, Dr. Edilia Bo  on 04/13/2014. He had Doppler studies to his lower extremities and was advised he had no DVT and good arterial flow. His swelling improved.    I saw him on 12/03/2013, at which time he reported doing well and had noted a benefit from Neupro patch 6 mg. I continued him on this dose as well as Azilect 1 mg once daily. His exam was stable.   I saw him on 07/28/2013, at which time he reported some worsening of his tremor and his gait. He was able to tolerate neupro patch 4 mg strength. He has had some skin irritation, most likely from the adhesive. He was not exercising regularly but endorsed being active and working full-time. His memory was stable. He denies any impulse control disorder but had mild daytime somnolence which was not as severe as when he was on Requip long-acting.     I saw him on 02/05/2013, at which time I felt his physical exam was a little worse  since stopping Requip XL. He had stopped this because of daytime somnolence and severe nausea. I suggested we start him on Neupro patch. I provided him with samples. We talked about doing a sleep study down the Road.     I first met him on 07/15/2012, at which time I suggested he start taking coenzyme Q 10. I also suggested that he switch his Requip XL 4 mg to nighttime because of report of daytime somnolence. I did not increase make any other changes to his medications and felt that he was overall stable.     He previously followed with Dr. Fayrene Fearing love and was last seen by him on 03/17/2012 at which time Dr. Sandria Manly increase his Requip XL and continued him on Azilect. He was diagnosed in 12/2008, and Sx go back to a year prior to that.   MRI brain without contrast was done in the past. He has been on rasagiline, which improved his tremor. 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 There is no family history of tremor. There is no history of REM behavior disorder. He has had no falls, or hallucinations or involuntary movements otherwise. He exercises not very regularly. His memory and mood have been stable. He has no problems with lower extremities swelling or compulsive thoughts or gambling. He works full-time as a Scientist, research (physical sciences).        His Past Medical History Is Significant For: Past Medical History:  Diagnosis Date   Arthritis    History of kidney stones 04/14/2019   surgery to remove   Lesion of ulnar nerve    no current problems   Parkinson's disease (HCC)    S/P deep brain stimulator    [employer] [phone], nondirectional lead   Sleep apnea    occasional uses cpap - setting at 7    His Past Surgical History Is Significant For: Past Surgical History:  Procedure Laterality Date   COLONOSCOPY  2021   CYSTOSCOPY WITH RETROGRADE PYELOGRAM, URETEROSCOPY AND STENT PLACEMENT Right 04/14/2019   Procedure: CYSTOSCOPY WITH RETROGRADE  PYELOGRAM, URETEROSCOPY AND STENT PLACEMENT;  Surgeon: Crista Elliot, MD;  Location: [facility];  Service: Urology;  Laterality: Right;   DBS surgery   03/2017   x 2 surg for phase 1 and phase 2 - brain stimulator for parkinson's disease   HAMMERTOE RECONSTRUCTION WITH WEIL OSTEOTOMY Right 05/25/2015   Procedure: RIGHT SECOND TOE METATARSAL WEIL OSTEOTOMY  HAMMERTOE CORRECTION COLLATERAL LIGAMENT REPAIR;  FLEXOR TO EXTENSOR TRANSFER ;  Surgeon: Toni Arthurs, MD;  Location: [facility];  Service: Orthopedics;  Laterality: Right;   INGUINAL HERNIA REPAIR Right 12/21/2020   Procedure: REPAIR RIGHT INGUINAL HERNIA REPAIR WITH MESH;  Surgeon: Violeta Gelinas, MD;  Location: [facility] OR;  Service: General;  Laterality: Right;   INSERTION OF MESH Right 12/21/2020   Procedure: INSERTION OF MESH;  Surgeon: Violeta Gelinas, MD;  Location: [facility] OR;  Service: General;  Laterality: Right;   INSERTION OF MESH Right 06/19/2021   Procedure: INSERTION OF MESH;  Surgeon: Axel Filler, MD;  Location: [facility] OR;  Service: General;  Laterality: Right;   kidney stones  1991   surgery to remove   TOTAL HIP ARTHROPLASTY Right 09/25/2022   Procedure: RIGHT TOTAL HIP ARTHROPLASTY ANTERIOR APPROACH;  Surgeon: Samson Frederic, MD;  Location: [facility];  Service: Orthopedics;  Laterality: Right;  130   XI ROBOTIC ASSISTED INGUINAL HERNIA REPAIR WITH MESH Right 06/19/2021   Procedure: XI ROBOTIC ASSISTED RIGHT INGUINAL HERNIA REPAIR WITH MESH;  Surgeon: Axel Filler, MD;  Location: [facility] OR;  Service: General;  Laterality: Right;    His Family History Is Significant For: Family History  Problem Relation Age of Onset   Sleep apnea Mother    Heart failure Father    Cancer Father    Diabetes Father    Sleep apnea Brother    Parkinson's disease Neg Hx     His Social History Is Significant For: Social History   Socioeconomic History   Marital status: Married    Spouse name: Olegario Messier    Number of children: 2   Years of  education: Not on file   Highest education level: Bachelor's degree (e.g., BA, AB, BS)  Occupational History   Not on file  Tobacco Use   Smoking status: Never   Smokeless tobacco: Never  Vaping Use   Vaping status: Never Used  Substance and Sexual Activity   Alcohol use: Yes    Comment: occasional   Drug use: No   Sexual activity: Not Currently  Other Topics Concern   Not on file  Social History Narrative   Consumes 1 soda a day    Right Handed   Social Determinants of Health   Financial Resource Strain: Not on file  Food Insecurity: No Food Insecurity (09/25/2022)   Hunger Vital Sign    Worried About Running Out of Food in the Last Year: Never true    Ran Out of Food in the Last Year: Never true  Transportation Needs: No Transportation Needs (09/25/2022)   PRAPARE - Administrator, Civil Service (Medical): No    Lack of Transportation (Non-Medical): No  Physical Activity: Not on file  Stress: Not on file  Social Connections: Not on file    His Allergies Are:  Allergies  Allergen Reactions   Ropinirole Hcl Nausea Only   Turmeric Itching  :   His Current Medications Are:  Outpatient Encounter Medications as of 12/25/2022  Medication Sig   acetaminophen (TYLENOL) 500 MG tablet Take 2 tablets (1,000 mg total) by mouth every 8 (eight) hours as needed for moderate pain, mild pain, fever or headache. (Patient taking differently: Take 1,000 mg by mouth as needed for moderate pain (pain score 4-6), mild pain (pain score 1-3), fever or headache.)   calcium carbonate (TUMS EX) 750 MG chewable tablet Chew 750 mg by mouth as needed for heartburn.   carbidopa-levodopa (SINEMET IR) 25-100 MG tablet Take 1.5 tablets  by mouth 4 (four) times daily. At 8, 12, 4 PM and 8 PM (Patient taking differently: Take 1.5 tablets by mouth 5 (five) times daily.)   hydrocortisone cream 1 % Apply 1 Application topically 2 (two) times daily as needed for itching.   ondansetron (ZOFRAN) 4 MG  tablet Take 1 tablet (4 mg total) by mouth every 8 (eight) hours as needed for nausea or vomiting. (Patient taking differently: Take 4 mg by mouth as needed for nausea or vomiting.)   rasagiline (AZILECT) 1 MG TABS tablet Take 1 tablet (1 mg total) by mouth daily.   rotigotine (NEUPRO) 4 MG/24HR APPLY 1 PATCH ONTO THE SKIN EVERY DAY   meloxicam (MOBIC) 15 MG tablet Take 15 mg by mouth daily.   Polyethyl Glycol-Propyl Glycol (LUBRICATING EYE DROPS OP) Place 1 drop into the left eye daily.   No facility-administered encounter medications on file as of 12/25/2022.  :  Review of Systems:  Out of a complete 14 point review of systems, all are reviewed and negative with the exception of these symptoms as listed below:  Review of Systems  Neurological:        Pt here for Parkinson f/u Pt states neck stiffness.     Objective:  Neurological Exam  Physical Exam Physical Examination:   Vitals:   12/25/22 0753  BP: 121/78  Pulse: 77    General Examination: The patient is a very pleasant 67 y.o. male in no acute distress. He appears well-developed and well-nourished and well groomed.  Good spirits.  HEENT: Normocephalic, unremarkable scar left parietal scalp from DBS. Pupils are equal, round and reactive to light, extraocular tracking shows moderate saccadic breakdown without nystagmus noted. There is no limitation to his gaze. There is mild to moderate decrease in eye blink rate. Hearing is intact.  Moderate nuchal rigidity, decreased range of motion.  Face is symmetric with moderate facial masking, no lip, neck or jaw tremor. Oropharynx exam shows moderate airway crowding. There is no sialorrhea, he has mild hypophonia, no dysarthria.  Tongue protrudes centrally and palate elevates symmetrically.   Chest: is clear to auscultation without wheezing, rhonchi or crackles noted.   Heart: sounds are regular and normal without murmurs, rubs or gallops noted.    Abdomen: is soft, non-tender and  non-distended.   Extremities: There is 2+ pitting edema in the distal lower extremities bilaterally.   Skin: is warm and dry with no trophic changes noted. Chronic swelling type changes and mild redness in both distal lower extremities.   Musculoskeletal: exam reveals neck pain.     Neurologically:  Mental status: The patient is awake and alert, paying good attention. He is able to completely provide the history. He is oriented to: person, place, time/date, situation, day of week, month of year and year. His memory, attention, language and knowledge are intact. There is no aphasia, agnosia, apraxia or anomia. There is perhaps mild bradyphrenia. Speech is moderately hypophonic with no dysarthria noted. Mood is congruent and affect is blunted.    Cranial nerves are as described above under HEENT exam.    Motor exam: Normal bulk, and strength for age is noted. Tone is mild to moderately rigid with presence of cogwheeling in the right upper extremity and RLE. There is overall moderate bradykinesia. There is no drift or rebound.  There is an intermittent mild resting tremor in the LUE, very slight in the right upper extremity, overall stable.  No obvious dyskinesias.    Fine motor skills exam:  moderate impairment noted on the right, mild to moderate impairment on the left, stable.   Cerebellar testing shows no dysmetria or intention tremor. There is no truncal or gait ataxia.    Sensory exam is intact to light touch in both upper and lower extremities.   Gait, station and balance: He stands up from the seated position with no significant difficulty but does need to push up with his hands. He needs no assistance. No veering to one side is noted. Posture mildly stooped for age, walks slowly, no walking aid.     Assessment and Plan:    In summary, CASHTEN GOODS is a very pleasant 67 year old right-handed gentleman with an underlying history of hyperlipidemia and kidney stones, s/p DBS for PD,  arthritis with status post right total hip replacement in August 2024, OSA, not currently on PAP therapy, who presents for followup consultation of his right-sided predominant Parkinson's disease, with status post left DBS, complicated by right foot pain and right leg pain, sleep disturbance including sleep apnea which is currently not treated with autoPAP, and significant neck pain.  He was diagnosed with Parkinson's disease in 2010, symptoms date back to 2009. He had no telltale history of RBD, had some vivid dreams in the past. He is status post left STN DBS placement in February 2019, and has done quite well after that.  He has been on a DBS ambassador for other patients who are considering DBS surgery at [facility].  He continues to be on Neupro patch, 4 mg strength. We reduced the patch to 6 mg strength in May 2018 for lower extremity swelling. I suggested we reduce his Neupro patch further to 4 mg in early 2020. His home sleep test from 12/23/2017 showed severe obstructive sleep apnea with an AHI of 46.9/h, O2 nadir of 84%.  He established treatment on AutoPap and had been compliant in the past but currently not using his machine.  We talked about the importance of treating sleep apnea.  He is advised to get back on AutoPap therapy.  We talked about his neck pain.  He is advised to trial baclofen 10 mg strength half a pill 3 times daily to up to 1 pill 3 times daily as needed.  We talked about expectations, common side effects and limitation of the medication.  He is advised not to drive after taking baclofen until he knows how it affects him.  He is advised to continue with his Parkinson's regimen, including Sinemet 1-1/2 pills 5 times a day, Azilect 1 pill once daily, Neupro patch 4 mg daily.  He is advised to monitor his lower extremity swelling which has become worse.  He is strongly advised not to use high-dose ibuprofen daily.  He is advised to talk to his primary care about his lower extremity  swelling and blood work in particular kidney function especially since he has been taking Mobic and high-dose ibuprofen currently.  He is encouraged to try compression stockings again.  Sleep apnea treatment may also result in reduction in edema.  He is advised to follow-up in this clinic routinely in 6 months, sooner if needed.  I answered all the questions today and the patient and his wife are in agreement.   I spent 45 minutes in total face-to-face time and in reviewing records during pre-charting, more than 50% of which was spent in counseling and coordination of care, reviewing test results, reviewing medications and treatment regimen and/or in discussing or reviewing the diagnosis of PD,  OSA, edema, the prognosis and treatment options. Pertinent laboratory and imaging test results that were available during this visit with the patient were reviewed by me and considered in my medical decision making (see chart for details).

## 2023-01-30 LAB — LAB REPORT - SCANNED
A1c: 5.9
EGFR: 96

## 2023-01-31 ENCOUNTER — Ambulatory Visit (HOSPITAL_COMMUNITY): Payer: Managed Care, Other (non HMO) | Attending: Internal Medicine

## 2023-01-31 ENCOUNTER — Other Ambulatory Visit (HOSPITAL_COMMUNITY): Payer: Self-pay | Admitting: Family Medicine

## 2023-01-31 DIAGNOSIS — R0600 Dyspnea, unspecified: Secondary | ICD-10-CM | POA: Diagnosis present

## 2023-01-31 DIAGNOSIS — R0609 Other forms of dyspnea: Secondary | ICD-10-CM | POA: Diagnosis not present

## 2023-01-31 DIAGNOSIS — I517 Cardiomegaly: Secondary | ICD-10-CM | POA: Diagnosis not present

## 2023-01-31 DIAGNOSIS — I498 Other specified cardiac arrhythmias: Secondary | ICD-10-CM

## 2023-01-31 LAB — ECHOCARDIOGRAM COMPLETE
Area-P 1/2: 3.46 cm2
S' Lateral: 3 cm

## 2023-02-13 ENCOUNTER — Other Ambulatory Visit: Payer: Self-pay | Admitting: *Deleted

## 2023-02-13 MED ORDER — NEUPRO 4 MG/24HR TD PT24: MEDICATED_PATCH | TRANSDERMAL | 1 refills | Status: DC

## 2023-02-13 MED ORDER — RASAGILINE MESYLATE 1 MG PO TABS: 1.0000 mg | ORAL_TABLET | Freq: Every day | ORAL | 1 refills | Status: DC

## 2023-03-07 NOTE — Progress Notes (Signed)
 Referring-Andrew Marvene FNP Reason for referral-lower extremity edema  HPI: 68 year old male for evaluation of lower extremity edema at request of Prentice Marvene FNP.  Previously followed at [facility].  Had hip replacement August 2024.  Echocardiogram December 2024 showed normal LV function, grade 1 diastolic dysfunction, mildly dilated aortic root at 40 mm.  Patient has a history of question junctional rhythm though based on notes limited because of baseline artifact.  Seen recently for increased bilateral lower extremity edema and Lasix was increased to 40 mg daily.  Holter monitor was ordered for question junctional rhythm.  Patient now presents for second opinion.  Also note recent laboratories from December showed no protein in his urine, normal hemoglobin, creatinine 0.86 and albumin 4.5.  He has noticed some increased dyspnea on exertion since August when he had his hip replaced.  Question orthopnea.  He has had bilateral lower extremity edema.  No chest pain or syncope.  Cardiology now asked to evaluate.  Also note he was initially started on Lasix 20 mg daily and now takes 40 mg twice daily with some improvement.  Current Outpatient Medications  Medication Sig Dispense Refill   acetaminophen  (TYLENOL ) 500 MG tablet Take 2 tablets (1,000 mg total) by mouth every 8 (eight) hours as needed for moderate pain, mild pain, fever or headache. (Patient taking differently: Take 1,000 mg by mouth as needed for moderate pain (pain score 4-6), mild pain (pain score 1-3), fever or headache.) 30 tablet 0   baclofen  (LIORESAL ) 10 MG tablet Take 1 tablet (10 mg total) by mouth 3 (three) times daily as needed for muscle spasms. 90 each 3   calcium carbonate (TUMS EX) 750 MG chewable tablet Chew 750 mg by mouth as needed for heartburn.     carbidopa -levodopa  (SINEMET  IR) 25-100 MG tablet Take 1.5 tablets by mouth 4 (four) times daily. At 8, 12, 4 PM and 8 PM (Patient taking differently: Take 1.5 tablets by mouth  5 (five) times daily.) 540 tablet 3   hydrocortisone cream 1 % Apply 1 Application topically 2 (two) times daily as needed for itching.     meloxicam (MOBIC) 15 MG tablet Take 15 mg by mouth daily.     ondansetron  (ZOFRAN ) 4 MG tablet Take 1 tablet (4 mg total) by mouth every 8 (eight) hours as needed for nausea or vomiting. (Patient taking differently: Take 4 mg by mouth as needed for nausea or vomiting.) 30 tablet 0   Polyethyl Glycol-Propyl Glycol (LUBRICATING EYE DROPS OP) Place 1 drop into the left eye daily.     rasagiline  (AZILECT ) 1 MG TABS tablet Take 1 tablet (1 mg total) by mouth daily. 90 tablet 1   rotigotine  (NEUPRO ) 4 MG/24HR APPLY 1 PATCH ONTO THE SKIN EVERY DAY 90 patch 1   No current facility-administered medications for this visit.    Allergies  Allergen Reactions   Ropinirole  Hcl Nausea Only   Turmeric Itching     Past Medical History:  Diagnosis Date   Arthritis    History of kidney stones 04/14/2019   surgery to remove   Lesion of ulnar nerve    no current problems   Parkinson's disease (HCC)    S/P deep brain stimulator    [employer] [phone], nondirectional lead   Sleep apnea    occasional uses cpap - setting at 7    Past Surgical History:  Procedure Laterality Date   COLONOSCOPY  2021   CYSTOSCOPY WITH RETROGRADE PYELOGRAM, URETEROSCOPY AND STENT PLACEMENT  Right 04/14/2019   Procedure: CYSTOSCOPY WITH RETROGRADE PYELOGRAM, URETEROSCOPY AND STENT PLACEMENT;  Surgeon: Carolee Sherwood JONETTA DOUGLAS, MD;  Location: [facility];  Service: Urology;  Laterality: Right;   DBS surgery   03/2017   x 2 surg for phase 1 and phase 2 - brain stimulator for parkinson's disease   HAMMERTOE RECONSTRUCTION WITH WEIL OSTEOTOMY Right 05/25/2015   Procedure: RIGHT SECOND TOE METATARSAL WEIL OSTEOTOMY  HAMMERTOE CORRECTION COLLATERAL LIGAMENT REPAIR; FLEXOR TO EXTENSOR TRANSFER ;  Surgeon: Norleen Armor, MD;  Location: [facility];  Service: Orthopedics;  Laterality:  Right;   INGUINAL HERNIA REPAIR Right 12/21/2020   Procedure: REPAIR RIGHT INGUINAL HERNIA REPAIR WITH MESH;  Surgeon: Sebastian Moles, MD;  Location: [facility] OR;  Service: General;  Laterality: Right;   INSERTION OF MESH Right 12/21/2020   Procedure: INSERTION OF MESH;  Surgeon: Sebastian Moles, MD;  Location: [facility];  Service: General;  Laterality: Right;   INSERTION OF MESH Right 06/19/2021   Procedure: INSERTION OF MESH;  Surgeon: Rubin Calamity, MD;  Location: [facility] OR;  Service: General;  Laterality: Right;   kidney stones  1991   surgery to remove   TOTAL HIP ARTHROPLASTY Right 09/25/2022   Procedure: RIGHT TOTAL HIP ARTHROPLASTY ANTERIOR APPROACH;  Surgeon: Fidel Rogue, MD;  Location: [facility];  Service: Orthopedics;  Laterality: Right;  130   XI ROBOTIC ASSISTED INGUINAL HERNIA REPAIR WITH MESH Right 06/19/2021   Procedure: XI ROBOTIC ASSISTED RIGHT INGUINAL HERNIA REPAIR WITH MESH;  Surgeon: Rubin Calamity, MD;  Location: [facility] OR;  Service: General;  Laterality: Right;    Social History   Socioeconomic History   Marital status: Married    Spouse name: Nathanel    Number of children: 2   Years of education: Not on file   Highest education level: Bachelor's degree (e.g., BA, AB, BS)  Occupational History   Not on file  Tobacco Use   Smoking status: Never   Smokeless tobacco: Never  Vaping Use   Vaping status: Never Used  Substance and Sexual Activity   Alcohol  use: Yes    Comment: occasional   Drug use: No   Sexual activity: Not Currently  Other Topics Concern   Not on file  Social History Narrative   Consumes 1 soda a day    Right Handed   Social Drivers of Health   Financial Resource Strain: Low Risk  (02/07/2023)   Received from [facility]   Overall Financial Resource Strain (CARDIA)    Difficulty of Paying Living Expenses: Not hard at all  Food Insecurity: No Food Insecurity (02/07/2023)   Received from [facility]   Hunger Vital Sign    Worried About Running Out  of Food in the Last Year: Never true    Ran Out of Food in the Last Year: Never true  Transportation Needs: No Transportation Needs (02/07/2023)   Received from [facility] - Transportation    Lack of Transportation (Medical): No    Lack of Transportation (Non-Medical): No  Physical Activity: Not on file  Stress: Not on file  Social Connections: Not on file  Intimate Partner Violence: Not At Risk (09/25/2022)   Humiliation, Afraid, Rape, and Kick questionnaire    Fear of Current or Ex-Partner: No    Emotionally Abused: No    Physically Abused: No    Sexually Abused: No    Family History  Problem Relation Age of Onset   Sleep apnea Mother    Heart  failure Father    Cancer Father    Diabetes Father    Sleep apnea Brother    Parkinson's disease Neg Hx     ROS: no fevers or chills, productive cough, hemoptysis, dysphasia, odynophagia, melena, hematochezia, dysuria, hematuria, rash, seizure activity, orthopnea, PND, claudication. Remaining systems are negative.  Physical Exam:   There were no vitals taken for this visit.  General:  Well developed/well nourished in NAD Skin warm/dry Patient not depressed No peripheral clubbing Back-normal HEENT-normal/normal eyelids Neck supple/normal carotid upstroke bilaterally; no bruits; no JVD; no thyromegaly chest - CTA/ normal expansion CV - RRR/normal S1 and S2; no murmurs, rubs or gallops;  PMI nondisplaced Abdomen -NT/ND, no HSM, no mass, + bowel sounds, no bruit 2+ femoral pulses, no bruits Ext-trace edema, no chords, 2+ DP Neuro-grossly nonfocal  ECG -January 30, 2023-significant baseline artifact but sinus rhythm with no ST changes personally reviewed.  February 07, 2023-significant baseline artifact but likely sinus rhythm with no ST changes.  Personally reviewed  EKG Interpretation Date/Time:  Wednesday March 19 2023 08:51:50 EST Ventricular Rate:  77 PR Interval:    QRS Duration:  154 QT  Interval:  474 QTC Calculation: 536 R Axis:   3  Text Interpretation: Normal sinus rhythm Confirmed by Pietro Rogue ([id number]) on 03/19/2023 8:53:45 AM    A/P  1 lower extremity edema-question venous insufficiency.  Recent echocardiogram by report shows normal LV function.  Note there was no evidence of proteinuria and his albumin was 4.5.  Will continue Lasix at present dose.  Check potassium, renal function and BNP.  I also recommended compression hose and feet elevation.  We also discussed low-sodium diet.  2 question junctional rhythm-I have reviewed outside electrocardiograms and that showed sinus rhythm.  3 obstructive sleep apnea-continue CPAP.  Rogue Pietro, MD

## 2023-03-19 ENCOUNTER — Ambulatory Visit: Payer: Managed Care, Other (non HMO) | Attending: Cardiology | Admitting: Cardiology

## 2023-03-19 ENCOUNTER — Encounter: Payer: Self-pay | Admitting: Cardiology

## 2023-03-19 VITALS — BP 124/72 | HR 77 | Ht 74.0 in | Wt 262.0 lb

## 2023-03-19 DIAGNOSIS — R600 Localized edema: Secondary | ICD-10-CM

## 2023-03-19 DIAGNOSIS — R0600 Dyspnea, unspecified: Secondary | ICD-10-CM | POA: Diagnosis not present

## 2023-03-19 NOTE — Patient Instructions (Signed)
   Follow-Up: At [facility], you and your health needs are our priority.  As part of our continuing mission to provide you with exceptional heart care, we have created designated Provider Care Teams.  These Care Teams include your primary Cardiologist (physician) and Advanced Practice Providers (APPs -  Physician Assistants and Nurse Practitioners) who all work together to provide you with the care you need, when you need it.  We recommend signing up for the patient portal called "MyChart".  Sign up information is provided on this After Visit Summary.  MyChart is used to connect with patients for Virtual Visits (Telemedicine).  Patients are able to view lab/test results, encounter notes, upcoming appointments, etc.  Non-urgent messages can be sent to your provider as well.   To learn more about what you can do with MyChart, go to [email or web address].    Your next appointment:   6 month(s)  Provider:   Olga Millers, MD

## 2023-03-20 ENCOUNTER — Encounter (HOSPITAL_BASED_OUTPATIENT_CLINIC_OR_DEPARTMENT_OTHER): Payer: Self-pay | Admitting: *Deleted

## 2023-03-20 LAB — BASIC METABOLIC PANEL
BUN/Creatinine Ratio: 25 — ABNORMAL HIGH (ref 10–24)
BUN: 21 mg/dL (ref 8–27)
CO2: 24 mmol/L (ref 20–29)
Calcium: 9.6 mg/dL (ref 8.6–10.2)
Chloride: 99 mmol/L (ref 96–106)
Creatinine, Ser: 0.83 mg/dL (ref 0.76–1.27)
Glucose: 88 mg/dL (ref 70–99)
Potassium: 4.3 mmol/L (ref 3.5–5.2)
Sodium: 139 mmol/L (ref 134–144)
eGFR: 97 mL/min/{1.73_m2} (ref >59)

## 2023-03-20 LAB — PRO B NATRIURETIC PEPTIDE: NT-Pro BNP: <36 pg/mL (ref 0–376)

## 2023-06-16 ENCOUNTER — Ambulatory Visit: Payer: Managed Care, Other (non HMO) | Admitting: Neurology

## 2023-08-05 ENCOUNTER — Ambulatory Visit (INDEPENDENT_AMBULATORY_CARE_PROVIDER_SITE_OTHER): Payer: Managed Care, Other (non HMO) | Admitting: Neurology

## 2023-08-05 ENCOUNTER — Encounter: Payer: Self-pay | Admitting: Neurology

## 2023-08-05 VITALS — BP 115/77 | HR 82 | Ht 74.0 in | Wt 274.4 lb

## 2023-08-05 DIAGNOSIS — N528 Other male erectile dysfunction: Secondary | ICD-10-CM

## 2023-08-05 DIAGNOSIS — R143 Flatulence: Secondary | ICD-10-CM | POA: Diagnosis not present

## 2023-08-05 DIAGNOSIS — G20A2 Parkinson's disease without dyskinesia, with fluctuations: Secondary | ICD-10-CM

## 2023-08-05 DIAGNOSIS — M542 Cervicalgia: Secondary | ICD-10-CM

## 2023-08-05 DIAGNOSIS — R291 Meningismus: Secondary | ICD-10-CM | POA: Diagnosis not present

## 2023-08-05 NOTE — Patient Instructions (Addendum)
 You can continue with the medication regimen for Parkinson's disease.  Talk to your urologist about your erectile dysfunction.  You may have to also run this by your cardiologist if you decide to start any medication. I hope you have a good time on your [place]  cruise.

## 2023-08-05 NOTE — Progress Notes (Signed)
 Subjective:    Patient ID: [name] is a 68 y.o. male.  HPI    Interim history:   [name] is a 68 year old right-handed gentleman with an underlying history of hyperlipidemia and kidney stones, s/p DBS for PD, arthritis with status post right total hip replacement in August 2024, OSA (not consistently on PAP therapy), and obesity, who presents for followup consultation of his right-sided predominant Parkinson's disease, with status post left DBS.  The patient is unaccompanied today.  I saw him in November 2024, at which time he reported significant neck pain and neck stiffness.  He was taking quite a bit of ibuprofen, up to 12 pills a day of the 200 mg strength.  He had worsening lower extremity edema.  He was advised to reduce his Neupro  patch to 4 mg strength due to his swelling.  He was advised to be more consistent with his AutoPap therapy.  For his neck pain he was given a trial of baclofen  as needed.  He was strongly advised not to use this much ibuprofen.  Today, 08/05/2023: I reviewed his most recent 69-month AutoPap compliance data, he used his AutoPap 48 out of 90 days with percent use days greater than 4 hours at 33%, indicating suboptimal compliance, average AHI at goal at 1.5/h, average pressure for the 95th percentile at 7.8 cm, leak on the higher side with the 95th percentile at 26.7 L/min on a pressure range of 7 to 13 cm with EPR of 3.  He is followed by cardiology, Dr. Pietro.  He has been found to have cardiomegaly and accelerated AV junctional rhythm.  He is also followed by orthopedics, Dr. Fidel. He had an echocardiogram on 01/31/2023 which showed an EF of 55 to 60%, normal LVEF, no regional wall motion abnormalities, trivial mitral valve regurgitation, aortic dilatation,  He is scheduled to see [name], PA at [facility] neurology in September 2025.  He has chronic issues with right shoulder pain, oral steroids have been helpful.  Baclofen  has been  somewhat helpful but he only takes it at night.  He has not fallen but has to be careful with his balance, has limited neck mobility, drove himself to his appointment today.  He is planning a cruise in [place]  at the end of July.  He will go with his entire family.  He does not have much in the way of constipation but significant flatulence.  He also mentions significant erectile dysfunction, has not really addressed it with his urologist yet.  His lower extremity swelling fluctuates.  He does believe it has come down a little bit after reducing the Neupro .  The patient's allergies, current medications, family history, past medical history, past social history, past surgical history and problem list were reviewed and updated as appropriate.    Previously (copied from previous notes for reference):   12/25/2022: He reports (prior to) his hip replacement, he had developed quite significant discomfort on the right side, including knee pain, had received injections in to the right knee and eventually had a scan for his right hip and was determined to need hip replacement.  He had interim right total hip replacement on 09/25/2022.  He had finished physical therapy, he began to have neck stiffness and neck pain, we started physical therapy recently for neck pain.  He is no longer on any narcotic pain medication is stopped meloxicam but reports that in the past few days, less than a week, he has been taking a lot  of ibuprofen over-the-counter, 200 mg strength up to 12 pills a day.  He will see [name], PA at [facility] in January 2025.  He continues to take his stable doses of Sinemet  generic, Azilect  once daily, and his Neupro  patch daily.  His lower extremity edema has worsened.  His wife has noticed his swelling as well.  In the past, he has tried compression stockings but they are very hard to put on.  He no longer uses his AutoPap consistently, 5 very uncomfortable, in reviewing his download for the past  year, he used his machine 56 days in the past year, no usage since mid June 2024.  He does not sleep well, he has discomfort sometimes gets out of bed, sometimes sleeps in the recliner, admits that sleep is not good currently.  Softener regularly.  He continues to take a baby aspirin , it was started after his hip surgery but he never stopped it.  He is wondering if he needs to continue with baby aspirin .  He does not have a family history of early cardiac death or severe heart disease or stroke.  Mom is alive in her early 29s.  His dad lived to be in his 96s.      I saw him on 01/10/2022, at which time we kept his medications the same.  He had a follow-up appointment with [facility] on 06/17/2022 and I reviewed the office visit note.  He was felt to be a good candidate for right DBS and advised to contact their office whenever he is ready for it.       I saw him on 07/11/2021, at which time he reported sciatica type symptoms.  He had right inguinal hernia surgery again in May 2023.  He had an emergency room visit in April 2023 for palpitations.  He was advised to continue with his medication regimen for Parkinson's disease.  He was advised to see orthopedics for his sciatica.   He saw [name], PA at [facility] neurology in the interim on 09/10/2021, at which time he reported lumbar radiculopathy type symptoms to the right.  The DBS settings on the left side were changed a little bit to see if it would help the right leg.  He was advised that his DBS was not MRI compatible in case he needed a scan for the lumbar spine.   I saw him on 11/08/2020 at which time he was on Sinemet  to 1-1/2 pills 5 times a day. This was done in July '22, when he last saw [name] at [facility]. He had a swallow study on 10/11/2020, which showed small penetration with thin liquids, trace to mild residuals with pudding, no frank aspiration. He also had an upper GI and HIDA scan.  He was having quite a bit of  GI issues.  He retired in June 2022. His older son moved to [place]. He was using his AutoPap. He c/o right hip pain.  He will eventually need right foot surgery, he was planning to getting an opinion from Dr. Duwayne who came recommended.    I saw him on 03/13/2020, at which time he reported feeling fairly stable.  He was quite consistent with his AutoPap.  He was not quite ready for his second DBS, he had a follow-up with Dr. Rosalia in January 2022.  He had noticed more restless legs type symptoms.  He was advised to increase his gabapentin  to up to 300 mg at bedtime.  We decided  to keep his Sinemet  at 1-1/2 pills 4 times a day, Neupro  at 4 mg once daily and Azilect  1 mg once daily.     I saw him on 08/23/2019, at which time he reported that the programming change recently had helped, he had a recent appointment at [facility].  He was advised to continue with his Sinemet  which was 1-1/2 pills 4 times a day although he had been advised to increase it to 5 times a day previously when he went to [facility].  He felt stable on the 4 times a day scheduling.  He was having more restless leg symptoms.  He was advised to start low-dose gabapentin  100 mg strength.    I reviewed the last 30 days, average usage was 5 hours and 20 minutes, date range was 02/12/2020 through 03/12/2020.  Average AHI 0.8/h, average pressure for the 95th percentile 8.3 cm, average leak for the 95th percentile 14.2 L/min.  Percent use days greater than 4 hours at 80% which is very good.         I saw him on 04/20/2019, at which time I suggested he increase his Sinemet .       I saw him on 12/21/18, at which time he was compliant with his AutoPap.  He reported doing okay with it.  He had ongoing issues with lower extremity swelling.  He was advised to exercise on a more consistent basis.  He had interim problems with hernia and reported that he may need surgery.  He ended up having right hydronephrosis and required a stent placement on  04/14/2019.    He had an interim appointment with [facility] for programming on 12/30/2018 and a subsequent appointment with Dr. Elayne on 01/20/2019 and I reviewed the office note.    I reviewed his AutoPap compliance data from 03/20/2019 through 04/18/2019 which is a total of 30 days, during which time he used his machine 27 days with percent use days greater than 4 hours at 83%, indicating very good compliance with an average usage of 5 hours and 35 minutes for days on treatment, residual AHI at goal at 0.5/h, 95th percentile pressure at 8.3 cm with a range of 7 cm to 13 cm with EPR, leak acceptable with a 95th percentile at 10.3 L/min.     I saw him in a virtual visit on 07/30/2018, at which time he was compliant with his AutoPap.  I suggested we continue with his medication regimen including Azilect  1 mg once daily, Sinemet  1-1/2 pills 4 times a day and Neupro  patch 4 mg daily.     I reviewed his AutoPap compliance data from 11/17/2018 through 12/16/2018 which is a total of 30 days, during which time he used his machine 27 days with percent use days greater than 4 hours at 80%, indicating very good compliance with an average usage of 5 hours and 58 minutes, residual AHI at goal at 0.7/h, leak high with a 95th percentile at 29.5 L/min on a pressure range of 7 cm to 13 cm with EPR, 95th percentile of pressure at 8.4 cm.       I saw him on 11/06/2017, at which time he felt stable from the Parkinson's standpoint.  He had recently seen Dr. Rosalia at [facility] in August and programming was kept the same.  His lower extremity swelling had gotten worse.     He emailed in December and in early January 2020 we reduced his Neupro  patch from 6 mg to  4 mg daily.   He saw Elveria Lunger, nurse practitioner in the interim on 03/31/2018 for his first AutoPap visit after his home sleep test and he was adequate with his AutoPap compliance at the time.    I reviewed his AutoPap compliance data from 06/29/2018 through  07/28/2018 which is a total of 30 days, during which time he used his machine 26 days with percent used days greater than 4 hours at 77%, indicating good compliance with an average usage of 4 hours and 43 minutes only, residual AHI at goal at 1/h, 95th percentile of pressure at 8.3 cm, leak on the higher side with a 95th percentile at 18.3 L/min on a pressure range of 7 cm to 13 cm with EPR.    I saw him on 05/06/2017, at which time he was doing well, walking and tremor were since his first programming appointment on 04/18/2017. Of note, he had his left DBS surgery on 04/04/2017 under Dr. Nancye. He has had a very good experience overall from beginning to end.      I saw him on 11/05/2016, at which time he reported no significant repercussions after reducing the Neupro  and increasing the Sinemet . His swelling had improved a little bit. He was still in the process of being evaluated for DBS surgery. I suggested we keep his medication regimen the same. We talked about the importance of losing weight especially in preparation for elective surgery.     I saw him on 07/04/2016, at which time he reported doing okay, was planning to pursue DBS surgery perhaps later this year. He was quite apprehensive. He had not scheduled his psychological evaluation or surgical consultation. He was overall considered a good candidate for DBS treatment per neurology. He was more on time with his Sinemet  and noticed improvement in his symptoms, lower extremity swelling was slightly worse. Neupro  was working well at 8 mg, but d/t swelling, I suggested with reduce it. I also suggested we increase his Sinemet  to 1-1/2 pills for times a day in lieu of keeping the Neupro  at 8 mg daily. He was no longer taking gabapentin  and his foot pain was indeed a little bit better with time, gabapentin  made him too sleepy.    I saw him on 03/05/2016, at which time he reported taking C/L 4 times a day, but not always all 4 doses, as he would forget  at times. Low-dose gabapentin  prn was helping his foot pain. No recent falls, no recent mood or memory issues, no word on the referral from [facility]. Had a recent cold and congestion. He was advised to continue with the current management, including Sinemet  1 pill 4 times a day, Azilect  1 mg once daily, Neupro  patch 8 mg once daily, gabapentin  300 mg at night as needed.   I resubmitted referral to [facility] for DBS Eval and patient was seen in the interim by Dr. Rosalia on 05/24/16. I reviewed the note.     I saw him on 12/04/2015, at which time he reported ongoing issues with his right foot including status post surgery in April 2017 for hammertoes but he had ongoing issues with pain and more stiffness. He felt overall that his right side was more stiff and tremulous. Since starting Sinemet  he had noted no telltale difference in his symptoms. He had consulted with different podiatrists and had tried orthotics. Mood and memory were stable. He was not sleeping very well. We talked about DBS treatment in more detail. I made a referral  to Dr. Rosalia at [facility]. I also increased his Sinemet  to 1 pill 4 times a day. I suggested we try him on low-dose gabapentin  for his right foot pain.   I saw him on 08/01/2015, at which time he reported feeling worse. He had more difficulty exercising, he had gained weight. He had foot surgery and his balance had been worse since then. He had hammertoe surgery on 05/25/2015. Because of decrease in mobility after his surgery his weight increase. His tremor was worse. Memory was stable. I suggested we continue with Neupro  8 mg patch daily. I suggested he continue with Azilect  once daily. I suggested we start him on the rytary  95 mg strength with gradual titration. However, he called in July reporting that the medication was too expensive, I therefore, switched him to Sinemet  generic. He also requested a handicap placard form to be filled out which I provided in September.   Of note, he  missed an appointment on 06/20/2015 due to being sick. I saw him on 12/01/2014, at which time he reported a change in his insurance, mood and memory were stable, lower extremity swelling was stable, we kept his Neupro  patch at 8 mg an Azilect  1 mg once daily the same. He was working full-time, also active with his 2 teenage boys, 66 year old an 68 year old. His wife had a recent change in her job.    I saw him on 08/02/2014, at which time he reported doing well. He had some leg swelling. His right hand swelling was a little worse. Cognitively and mood wise he was stable. Balance may have been off at times but generally speaking he was doing well.    I saw him on 04/05/2014, at which time he reported feeling fairly stable but his wife felt his balance was not as good. He was working out with a Psychologist, educational. He had picked up boxing. He was working full-time. He cut back on his sodas. I kept him on his medications, Neupro  patch 6 mg and Azilect  1 mg. He had some ongoing issues with lower extremity swelling and I referred him to vascular surgery for consultation. He was seen by a vascular specialist, Dr. Eliza on 04/13/2014. He had Doppler studies to his lower extremities and was advised he had no DVT and good arterial flow. His swelling improved.    I saw him on 12/03/2013, at which time he reported doing well and had noted a benefit from Neupro  patch 6 mg. I continued him on this dose as well as Azilect  1 mg once daily. His exam was stable.   I saw him on 07/28/2013, at which time he reported some worsening of his tremor and his gait. He was able to tolerate neupro  patch 4 mg strength. He has had some skin irritation, most likely from the adhesive. He was not exercising regularly but endorsed being active and working full-time. His memory was stable. He denies any impulse control disorder but had mild daytime somnolence which was not as severe as when he was on Requip  long-acting.     I saw him on 02/05/2013,  at which time I felt his physical exam was a little worse since stopping Requip  XL. He had stopped this because of daytime somnolence and severe nausea. I suggested we start him on Neupro  patch. I provided him with samples. We talked about doing a sleep study down the Road.     I first met him on 07/15/2012, at which time I suggested he start taking coenzyme  Q 10. I also suggested that he switch his Requip  XL 4 mg to nighttime because of report of daytime somnolence. I did not increase make any other changes to his medications and felt that he was overall stable.     He previously followed with Dr. Lynwood love and was last seen by him on 03/17/2012 at which time Dr. Maurice increase his Requip  XL and continued him on Azilect . He was diagnosed in 12/2008, and Sx go back to a year prior to that.   MRI brain without contrast was done in the past. He has been on rasagiline , which improved his tremor. 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 There is no family history of tremor. There is no history of REM behavior disorder. He has had no falls, or hallucinations or involuntary movements otherwise. He exercises not very regularly. His memory and mood have been stable. He has no problems with lower extremities swelling or compulsive thoughts or gambling. He works full-time as a Scientist, research (physical sciences).     His Past Medical History Is Significant For: Past Medical History:  Diagnosis Date   Arthritis    History of kidney stones 04/14/2019   surgery to remove   Lesion of ulnar nerve    no current problems   Parkinson's disease (HCC)    S/P deep brain stimulator    [employer] [phone], nondirectional lead   Sleep apnea    occasional uses cpap - setting at 7    His Past Surgical History Is Significant For: Past Surgical History:  Procedure Laterality Date   COLONOSCOPY  2021   CYSTOSCOPY WITH RETROGRADE PYELOGRAM, URETEROSCOPY AND STENT PLACEMENT Right  04/14/2019   Procedure: CYSTOSCOPY WITH RETROGRADE PYELOGRAM, URETEROSCOPY AND STENT PLACEMENT;  Surgeon: Carolee Sherwood JONETTA DOUGLAS, MD;  Location: [facility];  Service: Urology;  Laterality: Right;   DBS surgery   03/2017   x 2 surg for phase 1 and phase 2 - brain stimulator for parkinson's disease   HAMMERTOE RECONSTRUCTION WITH WEIL OSTEOTOMY Right 05/25/2015   Procedure: RIGHT SECOND TOE METATARSAL WEIL OSTEOTOMY  HAMMERTOE CORRECTION COLLATERAL LIGAMENT REPAIR; FLEXOR TO EXTENSOR TRANSFER ;  Surgeon: Norleen Armor, MD;  Location: [facility];  Service: Orthopedics;  Laterality: Right;   INGUINAL HERNIA REPAIR Right 12/21/2020   Procedure: REPAIR RIGHT INGUINAL HERNIA REPAIR WITH MESH;  Surgeon: Sebastian Moles, MD;  Location: [facility] OR;  Service: General;  Laterality: Right;   INSERTION OF MESH Right 12/21/2020   Procedure: INSERTION OF MESH;  Surgeon: Sebastian Moles, MD;  Location: [facility] OR;  Service: General;  Laterality: Right;   INSERTION OF MESH Right 06/19/2021   Procedure: INSERTION OF MESH;  Surgeon: Rubin Calamity, MD;  Location: [facility] OR;  Service: General;  Laterality: Right;   kidney stones  1991   surgery to remove   TOTAL HIP ARTHROPLASTY Right 09/25/2022   Procedure: RIGHT TOTAL HIP ARTHROPLASTY ANTERIOR APPROACH;  Surgeon: Fidel Rogue, MD;  Location: [facility];  Service: Orthopedics;  Laterality: Right;  130   XI ROBOTIC ASSISTED INGUINAL HERNIA REPAIR WITH MESH Right 06/19/2021   Procedure: XI ROBOTIC ASSISTED RIGHT INGUINAL HERNIA REPAIR WITH MESH;  Surgeon: Rubin Calamity, MD;  Location: [facility] OR;  Service: General;  Laterality: Right;    His Family History Is Significant For: Family History  Problem Relation Age of Onset   Sleep apnea Mother    Heart failure Father    Cancer Father  Diabetes Father    Sleep apnea Brother    Parkinson's disease Neg Hx     His Social History Is Significant For: Social History   Socioeconomic History   Marital status: Married     Spouse name: Nathanel    Number of children: 2   Years of education: Not on file   Highest education level: Bachelor's degree (e.g., BA, AB, BS)  Occupational History   Not on file  Tobacco Use   Smoking status: Never   Smokeless tobacco: Never  Vaping Use   Vaping status: Never Used  Substance and Sexual Activity   Alcohol  use: Yes    Comment: occasional   Drug use: No   Sexual activity: Not Currently  Other Topics Concern   Not on file  Social History Narrative   Consumes 1 soda a day    Right Handed   Pt lives with family    Social Drivers of Health   Financial Resource Strain: Low Risk  (02/07/2023)   Received from [facility]   Overall Financial Resource Strain (CARDIA)    Difficulty of Paying Living Expenses: Not hard at all  Food Insecurity: No Food Insecurity (02/07/2023)   Received from [facility]   Hunger Vital Sign    Within the past 12 months, you worried that your food would run out before you got the money to buy more.: Never true    Within the past 12 months, the food you bought just didn't last and you didn't have money to get more.: Never true  Transportation Needs: No Transportation Needs (02/07/2023)   Received from [facility]    Lack of Transportation (Medical): No    Lack of Transportation (Non-Medical): No  Physical Activity: Not on file  Stress: Not on file  Social Connections: Not on file    His Allergies Are:  Allergies  Allergen Reactions   Ropinirole  Hcl Nausea Only   Turmeric Itching  :   His Current Medications Are:  Outpatient Encounter Medications as of 08/05/2023  Medication Sig   acetaminophen  (TYLENOL ) 500 MG tablet Take 2 tablets (1,000 mg total) by mouth every 8 (eight) hours as needed for moderate pain, mild pain, fever or headache. (Patient taking differently: Take 1,000 mg by mouth as needed for moderate pain (pain score 4-6), mild pain (pain score 1-3), fever or headache.)   baclofen   (LIORESAL ) 10 MG tablet Take 1 tablet (10 mg total) by mouth 3 (three) times daily as needed for muscle spasms.   calcium carbonate (TUMS EX) 750 MG chewable tablet Chew 750 mg by mouth as needed for heartburn.   carbidopa -levodopa  (SINEMET  IR) 25-100 MG tablet Take 1.5 tablets by mouth 4 (four) times daily. At 8, 12, 4 PM and 8 PM (Patient taking differently: Take 1.5 tablets by mouth 5 (five) times daily.)   hydrocortisone cream 1 % Apply 1 Application topically 2 (two) times daily as needed for itching.   ondansetron  (ZOFRAN ) 4 MG tablet Take 1 tablet (4 mg total) by mouth every 8 (eight) hours as needed for nausea or vomiting. (Patient taking differently: Take 4 mg by mouth as needed for nausea or vomiting.)   Polyethyl Glycol-Propyl Glycol (LUBRICATING EYE DROPS OP) Place 1 drop into the left eye daily.   rasagiline  (AZILECT ) 1 MG TABS tablet Take 1 tablet (1 mg total) by mouth daily.   rotigotine  (NEUPRO ) 4 MG/24HR APPLY 1 PATCH ONTO THE SKIN EVERY DAY   furosemide (LASIX) 40  MG tablet Take 40 mg by mouth 2 (two) times daily. (Patient not taking: Reported on 08/05/2023)   meloxicam (MOBIC) 15 MG tablet Take 15 mg by mouth daily. (Patient not taking: Reported on 08/05/2023)   No facility-administered encounter medications on file as of 08/05/2023.  :  Review of Systems:  Out of a complete 14 point review of systems, all are reviewed and negative with the exception of these symptoms as listed below:  Review of Systems  Neurological:        Pt here for Parkinson f/u Pt states same since last office visit Pt wants to discuss pinched nerve in right shoulder     Objective:  Neurological Exam  Physical Exam Physical Examination:   Vitals:   08/05/23 1046  BP: 115/77  Pulse: 82    General Examination: The patient is a very pleasant 68 y.o. male in no acute distress. He appears frail and somewhat deconditioned, well-groomed.    HEENT: Normocephalic, unremarkable scar left parietal scalp  from DBS. Pupils are equal, round and reactive to light, extraocular tracking shows moderate saccadic breakdown without nystagmus noted. There is mild limitation to upgaze.  There is mild to moderate decrease in eye blink rate. Hearing is intact.  Moderate nuchal rigidity, decreased range of motion in the neck and more forward tilt noted in the neck.  Face is symmetric with moderate facial masking, no lip, neck or jaw tremor. Oropharynx exam shows moderate airway crowding. There is no sialorrhea, he has mild hypophonia, no dysarthria.  Tongue protrudes centrally and palate elevates symmetrically.   Chest: is clear to auscultation without wheezing, rhonchi or crackles noted.   Heart: sounds are regular and normal without murmurs, rubs or gallops noted.    Abdomen: is soft, non-tender and non-distended.   Extremities: There is 2+ pitting edema in the distal lower extremities bilaterally.   Skin: is warm and dry with no trophic changes noted. Chronic swelling type changes and mild redness in both distal lower extremities, particularly in the shin areas bilaterally.    Musculoskeletal: exam reveals neck pain and limited range of motion in the neck, right shoulder higher compared to left.     Neurologically:  Mental status: The patient is awake and alert, paying good attention. He is able to completely provide the history. He is oriented to: person, place, time/date, situation, day of week, month of year and year. His memory, attention, language and knowledge are intact. There is no aphasia, agnosia, apraxia or anomia. There is perhaps mild bradyphrenia. Speech is moderately hypophonic with no dysarthria noted. Mood is congruent and affect is blunted.    Cranial nerves are as described above under HEENT exam.    Motor exam: Normal bulk, and strength for age is noted. Tone is moderately rigid with presence of cogwheeling in the right upper extremity and RLE. There is overall moderate bradykinesia. There  is no drift or rebound.  There is an intermittent mild resting tremor in the L and right UEs.  No obvious dyskinesias.    Fine motor skills exam:  moderate impairment noted on the right, mild to moderate impairment on the left, stable.   Cerebellar testing shows no dysmetria or intention tremor. There is no truncal or gait ataxia.    Sensory exam is intact to light touch in both upper and lower extremities.   Gait, station and balance: He stands up from the seated position with no significant difficulty but does need to push up with his hands. He  needs no assistance. No veering to one side is noted. Posture is stooped for age, walks slowly, no walking aid.     Assessment and Plan:    In summary, [name] is a very pleasant 68 year old right-handed gentleman with an underlying history of hyperlipidemia and kidney stones, s/p DBS for PD, arthritis with status post right total hip replacement in August 2024, OSA, on AutoPap therapy, and obesity, who presents for followup consultation of his right-sided predominant Parkinson's disease, with status post left DBS, complicated by right foot pain and right leg pain, sleep disturbance including sleep apnea, neck pain and shoulder pain.   Of note, he was diagnosed with Parkinson's disease in 2010, symptoms date back to 2009. He had no telltale history of RBD, had some vivid dreams in the past. He is status post left [facility] DBS placement in February 2019, has a Theatre manager and received a new Magazine features editor not too long ago.  Overall, he has done quite well after that.  He has been on a DBS ambassador for other patients who are considering DBS surgery at [facility].  He continues to be on Neupro  patch, 4 mg strength.  We reduced the patch to 6 mg strength in May 2018 for lower extremity swelling.  I suggested we reduce his Neupro  patch further to 4 mg in early 2020.  His home sleep test from 12/23/2017 showed severe obstructive sleep apnea with  an AHI of 46.9/h, O2 nadir of 84%.  He established treatment on AutoPap and had been compliant in the past but has had fluctuation in his treatment adherence.   We talked about his neck pain.  I prescribed baclofen  as needed in November 2024.  He can continue to use it but is cautioned regarding the sedating properties of it.  He is advised to limit his driving as he has neck mobility impairment and slowness as well as stiffness from the Parkinson's disease.  He is advised to stay well-hydrated and well rested and monitor for bloating and constipation, has experienced some excessive flatulence.  He is advised to talk to his urologist about his ED.   He is advised to continue with his Parkinson's regimen, including Sinemet  1-1/2 pills 5 times a day, Azilect  1 pill once daily, Neupro  patch 4 mg daily.    He is advised to monitor his lower extremity swelling.    He is going to see [name], PA in September 2025 and I suggested a follow-up here in about 8 months from now.  I answered all his questions today and he was in agreement.  I spent 45 minutes in total face-to-face time and in reviewing records during pre-charting, more than 50% of which was spent in counseling and coordination of care, reviewing test results, reviewing medications and treatment regimen and/or in discussing or reviewing the diagnosis of PD, the prognosis and treatment options. Pertinent laboratory and imaging test results that were available during this visit with the patient were reviewed by me and considered in my medical decision making (see chart for details).

## 2023-08-06 ENCOUNTER — Encounter: Payer: Self-pay | Admitting: Neurology

## 2023-08-06 MED ORDER — RASAGILINE MESYLATE 1 MG PO TABS: 1.0000 mg | ORAL_TABLET | Freq: Every day | ORAL | 2 refills | Status: AC

## 2023-08-19 MED ORDER — NEUPRO 4 MG/24HR TD PT24: MEDICATED_PATCH | TRANSDERMAL | 2 refills | Status: AC

## 2023-08-19 NOTE — Addendum Note (Signed)
 Addended by: HILLIARD HEATHER CROME on: 08/19/2023 12:11 PM   Modules accepted: Orders

## 2023-08-19 NOTE — Telephone Encounter (Signed)
 Neupro  refilled. Pending appt 03/2024.

## 2024-01-16 ENCOUNTER — Encounter (HOSPITAL_COMMUNITY): Payer: Self-pay | Admitting: General Surgery

## 2024-01-26 ENCOUNTER — Encounter: Payer: Self-pay | Admitting: Neurology

## 2024-01-28 ENCOUNTER — Other Ambulatory Visit: Payer: Self-pay | Admitting: *Deleted

## 2024-01-28 DIAGNOSIS — G20A2 Parkinson's disease without dyskinesia, with fluctuations: Secondary | ICD-10-CM

## 2024-01-28 NOTE — Telephone Encounter (Signed)
 Order signed by Dr Buck. I spoke with the patient. He would like the order emailed to him. He was very adult nurse.   [email or web address]   Done

## 2024-03-02 ENCOUNTER — Encounter: Payer: Self-pay | Admitting: Neurology

## 2024-03-02 DIAGNOSIS — G20A1 Parkinson's disease without dyskinesia, without mention of fluctuations: Secondary | ICD-10-CM

## 2024-03-03 MED ORDER — CARBIDOPA-LEVODOPA 25-100 MG PO TABS: 1.5000 | ORAL_TABLET | Freq: Every day | ORAL | 0 refills | Status: AC

## 2024-03-03 NOTE — Telephone Encounter (Signed)
 Pt is requesting a refill for carbidopa -levodopa  (SINEMET  IR) 25-100 MG tablet.  Pharmacy: [facility] [phone]

## 2024-04-06 ENCOUNTER — Ambulatory Visit: Admitting: Neurology
